# Patient Record
Sex: Female | Born: 1965 | Race: White | Hispanic: No | Smoking: Never smoker
Health system: Southern US, Community
[De-identification: ages and names within clinical notes are randomized; demographics above are authoritative.]

## PROBLEM LIST (undated history)

## (undated) DIAGNOSIS — M5430 Sciatica, unspecified side: Secondary | ICD-10-CM

## (undated) DIAGNOSIS — F419 Anxiety disorder, unspecified: Secondary | ICD-10-CM

## (undated) DIAGNOSIS — M1990 Unspecified osteoarthritis, unspecified site: Secondary | ICD-10-CM

## (undated) DIAGNOSIS — D352 Benign neoplasm of pituitary gland: Secondary | ICD-10-CM

## (undated) DIAGNOSIS — R0602 Shortness of breath: Secondary | ICD-10-CM

## (undated) DIAGNOSIS — H409 Unspecified glaucoma: Secondary | ICD-10-CM

## (undated) DIAGNOSIS — M549 Dorsalgia, unspecified: Secondary | ICD-10-CM

## (undated) DIAGNOSIS — M2550 Pain in unspecified joint: Secondary | ICD-10-CM

## (undated) DIAGNOSIS — Z98811 Dental restoration status: Secondary | ICD-10-CM

## (undated) DIAGNOSIS — E559 Vitamin D deficiency, unspecified: Secondary | ICD-10-CM

## (undated) DIAGNOSIS — M069 Rheumatoid arthritis, unspecified: Secondary | ICD-10-CM

## (undated) DIAGNOSIS — Z87442 Personal history of urinary calculi: Secondary | ICD-10-CM

## (undated) DIAGNOSIS — L729 Follicular cyst of the skin and subcutaneous tissue, unspecified: Secondary | ICD-10-CM

## (undated) DIAGNOSIS — K219 Gastro-esophageal reflux disease without esophagitis: Secondary | ICD-10-CM

## (undated) DIAGNOSIS — E785 Hyperlipidemia, unspecified: Secondary | ICD-10-CM

## (undated) DIAGNOSIS — G5601 Carpal tunnel syndrome, right upper limb: Secondary | ICD-10-CM

## (undated) DIAGNOSIS — R1310 Dysphagia, unspecified: Secondary | ICD-10-CM

## (undated) DIAGNOSIS — M2560 Stiffness of unspecified joint, not elsewhere classified: Secondary | ICD-10-CM

## (undated) DIAGNOSIS — G4730 Sleep apnea, unspecified: Secondary | ICD-10-CM

## (undated) DIAGNOSIS — F32A Depression, unspecified: Secondary | ICD-10-CM

## (undated) HISTORY — DX: Depression, unspecified: F32.A

## (undated) HISTORY — DX: Benign neoplasm of pituitary gland: D35.2

## (undated) HISTORY — DX: Dysphagia, unspecified: R13.10

## (undated) HISTORY — PX: CATARACT EXTRACTION: SUR2

## (undated) HISTORY — PX: TOTAL KNEE ARTHROPLASTY: SHX125

## (undated) HISTORY — PX: TOTAL HIP ARTHROPLASTY: SHX124

## (undated) HISTORY — DX: Anxiety disorder, unspecified: F41.9

## (undated) HISTORY — DX: Sciatica, unspecified side: M54.30

## (undated) HISTORY — PX: CARPAL TUNNEL RELEASE: SHX101

## (undated) HISTORY — PX: REVISION TOTAL KNEE ARTHROPLASTY: SUR1280

## (undated) HISTORY — DX: Carpal tunnel syndrome, right upper limb: G56.01

## (undated) HISTORY — DX: Sleep apnea, unspecified: G47.30

## (undated) HISTORY — DX: Vitamin D deficiency, unspecified: E55.9

## (undated) HISTORY — DX: Rheumatoid arthritis, unspecified: M06.9

## (undated) HISTORY — DX: Shortness of breath: R06.02

## (undated) HISTORY — DX: Hyperlipidemia, unspecified: E78.5

## (undated) HISTORY — DX: Unspecified osteoarthritis, unspecified site: M19.90

## (undated) HISTORY — DX: Pain in unspecified joint: M25.50

## (undated) HISTORY — DX: Dorsalgia, unspecified: M54.9

---

## 2003-08-24 ENCOUNTER — Inpatient Hospital Stay (HOSPITAL_COMMUNITY): Admission: AD | Admit: 2003-08-24 | Discharge: 2003-08-24 | Payer: Self-pay | Admitting: Obstetrics and Gynecology

## 2003-08-24 ENCOUNTER — Inpatient Hospital Stay (HOSPITAL_COMMUNITY): Admission: AD | Admit: 2003-08-24 | Discharge: 2003-08-26 | Payer: Self-pay | Admitting: Obstetrics and Gynecology

## 2003-08-26 ENCOUNTER — Inpatient Hospital Stay (HOSPITAL_COMMUNITY): Admission: AD | Admit: 2003-08-26 | Discharge: 2003-08-29 | Payer: Self-pay | Admitting: Obstetrics and Gynecology

## 2003-08-28 ENCOUNTER — Encounter: Payer: Self-pay | Admitting: Urology

## 2003-08-28 HISTORY — PX: CYSTOSCOPY WITH RETROGRADE PYELOGRAM, URETEROSCOPY AND STENT PLACEMENT: SHX5789

## 2004-01-01 ENCOUNTER — Inpatient Hospital Stay (HOSPITAL_COMMUNITY): Admission: AD | Admit: 2004-01-01 | Discharge: 2004-01-04 | Payer: Self-pay | Admitting: Obstetrics and Gynecology

## 2004-04-28 ENCOUNTER — Ambulatory Visit (HOSPITAL_COMMUNITY): Admission: RE | Admit: 2004-04-28 | Discharge: 2004-04-28 | Payer: Self-pay | Admitting: Gastroenterology

## 2004-04-28 HISTORY — PX: ESOPHAGEAL MANOMETRY: SHX1526

## 2004-08-06 ENCOUNTER — Ambulatory Visit: Payer: Self-pay | Admitting: Psychology

## 2004-08-20 ENCOUNTER — Ambulatory Visit: Payer: Self-pay | Admitting: Psychology

## 2004-08-31 ENCOUNTER — Ambulatory Visit: Payer: Self-pay | Admitting: Psychology

## 2004-09-10 ENCOUNTER — Ambulatory Visit: Payer: Self-pay | Admitting: Psychology

## 2004-09-15 ENCOUNTER — Ambulatory Visit: Payer: Self-pay | Admitting: Psychology

## 2004-09-21 ENCOUNTER — Ambulatory Visit: Payer: Self-pay | Admitting: Psychology

## 2004-09-30 ENCOUNTER — Ambulatory Visit: Payer: Self-pay | Admitting: Psychology

## 2004-10-07 ENCOUNTER — Ambulatory Visit: Payer: Self-pay | Admitting: Psychology

## 2004-10-14 ENCOUNTER — Ambulatory Visit: Payer: Self-pay | Admitting: Psychology

## 2004-10-28 ENCOUNTER — Ambulatory Visit: Payer: Self-pay | Admitting: Psychology

## 2004-11-03 ENCOUNTER — Ambulatory Visit: Payer: Self-pay | Admitting: Psychology

## 2004-11-05 ENCOUNTER — Other Ambulatory Visit: Admission: RE | Admit: 2004-11-05 | Discharge: 2004-11-05 | Payer: Self-pay | Admitting: Obstetrics and Gynecology

## 2004-11-12 ENCOUNTER — Ambulatory Visit: Payer: Self-pay | Admitting: Psychology

## 2004-11-16 ENCOUNTER — Ambulatory Visit: Payer: Self-pay | Admitting: Psychology

## 2004-12-09 ENCOUNTER — Ambulatory Visit: Payer: Self-pay | Admitting: Psychology

## 2004-12-14 ENCOUNTER — Ambulatory Visit: Payer: Self-pay | Admitting: Psychology

## 2004-12-23 ENCOUNTER — Ambulatory Visit: Payer: Self-pay | Admitting: Psychology

## 2004-12-30 ENCOUNTER — Ambulatory Visit: Payer: Self-pay | Admitting: Psychology

## 2005-01-05 ENCOUNTER — Ambulatory Visit: Payer: Self-pay | Admitting: Psychology

## 2005-01-07 ENCOUNTER — Encounter: Admission: RE | Admit: 2005-01-07 | Discharge: 2005-01-07 | Payer: Self-pay | Admitting: Rheumatology

## 2005-01-19 ENCOUNTER — Ambulatory Visit: Payer: Self-pay | Admitting: Psychology

## 2005-01-27 ENCOUNTER — Ambulatory Visit: Payer: Self-pay | Admitting: Psychology

## 2005-02-16 ENCOUNTER — Ambulatory Visit: Payer: Self-pay | Admitting: Psychology

## 2005-03-02 ENCOUNTER — Ambulatory Visit: Payer: Self-pay | Admitting: Psychology

## 2005-03-05 ENCOUNTER — Ambulatory Visit: Payer: Self-pay | Admitting: Licensed Clinical Social Worker

## 2005-03-12 ENCOUNTER — Ambulatory Visit: Payer: Self-pay | Admitting: Licensed Clinical Social Worker

## 2005-03-16 ENCOUNTER — Ambulatory Visit: Payer: Self-pay | Admitting: Licensed Clinical Social Worker

## 2005-03-26 ENCOUNTER — Ambulatory Visit: Payer: Self-pay | Admitting: Licensed Clinical Social Worker

## 2005-03-30 ENCOUNTER — Ambulatory Visit: Payer: Self-pay | Admitting: Internal Medicine

## 2005-04-01 ENCOUNTER — Ambulatory Visit: Payer: Self-pay | Admitting: Licensed Clinical Social Worker

## 2005-04-30 ENCOUNTER — Ambulatory Visit: Payer: Self-pay | Admitting: Licensed Clinical Social Worker

## 2005-05-04 ENCOUNTER — Encounter: Admission: RE | Admit: 2005-05-04 | Discharge: 2005-05-04 | Payer: Self-pay | Admitting: Rheumatology

## 2005-05-11 ENCOUNTER — Ambulatory Visit: Payer: Self-pay | Admitting: Licensed Clinical Social Worker

## 2005-05-19 ENCOUNTER — Ambulatory Visit: Payer: Self-pay | Admitting: Licensed Clinical Social Worker

## 2005-05-28 ENCOUNTER — Ambulatory Visit: Payer: Self-pay | Admitting: Licensed Clinical Social Worker

## 2005-06-02 ENCOUNTER — Ambulatory Visit: Payer: Self-pay | Admitting: Licensed Clinical Social Worker

## 2005-06-10 ENCOUNTER — Ambulatory Visit: Payer: Self-pay | Admitting: Licensed Clinical Social Worker

## 2005-06-18 ENCOUNTER — Ambulatory Visit: Payer: Self-pay | Admitting: Licensed Clinical Social Worker

## 2005-06-25 ENCOUNTER — Ambulatory Visit: Payer: Self-pay | Admitting: Licensed Clinical Social Worker

## 2005-06-29 ENCOUNTER — Ambulatory Visit: Payer: Self-pay | Admitting: Licensed Clinical Social Worker

## 2005-07-09 ENCOUNTER — Ambulatory Visit: Payer: Self-pay | Admitting: Licensed Clinical Social Worker

## 2005-07-09 ENCOUNTER — Ambulatory Visit: Payer: Self-pay | Admitting: Pulmonary Disease

## 2005-07-13 ENCOUNTER — Ambulatory Visit: Payer: Self-pay | Admitting: Pulmonary Disease

## 2005-07-15 ENCOUNTER — Ambulatory Visit: Payer: Self-pay | Admitting: Internal Medicine

## 2005-07-23 ENCOUNTER — Ambulatory Visit: Payer: Self-pay | Admitting: Licensed Clinical Social Worker

## 2005-08-06 ENCOUNTER — Ambulatory Visit: Payer: Self-pay | Admitting: Licensed Clinical Social Worker

## 2005-08-24 ENCOUNTER — Ambulatory Visit: Payer: Self-pay | Admitting: Licensed Clinical Social Worker

## 2005-09-07 ENCOUNTER — Ambulatory Visit: Payer: Self-pay | Admitting: Licensed Clinical Social Worker

## 2005-09-10 ENCOUNTER — Ambulatory Visit: Payer: Self-pay | Admitting: Licensed Clinical Social Worker

## 2005-10-05 ENCOUNTER — Ambulatory Visit: Payer: Self-pay | Admitting: Licensed Clinical Social Worker

## 2005-10-12 ENCOUNTER — Ambulatory Visit: Payer: Self-pay | Admitting: Licensed Clinical Social Worker

## 2005-10-15 ENCOUNTER — Ambulatory Visit: Payer: Self-pay | Admitting: Internal Medicine

## 2005-10-20 ENCOUNTER — Ambulatory Visit: Payer: Self-pay | Admitting: Licensed Clinical Social Worker

## 2005-10-26 ENCOUNTER — Ambulatory Visit: Payer: Self-pay | Admitting: Licensed Clinical Social Worker

## 2005-11-05 ENCOUNTER — Ambulatory Visit: Payer: Self-pay | Admitting: Licensed Clinical Social Worker

## 2005-11-09 ENCOUNTER — Other Ambulatory Visit: Admission: RE | Admit: 2005-11-09 | Discharge: 2005-11-09 | Payer: Self-pay | Admitting: Obstetrics and Gynecology

## 2005-11-12 ENCOUNTER — Ambulatory Visit: Payer: Self-pay | Admitting: Licensed Clinical Social Worker

## 2005-11-15 ENCOUNTER — Ambulatory Visit: Payer: Self-pay | Admitting: Licensed Clinical Social Worker

## 2005-11-19 ENCOUNTER — Ambulatory Visit: Payer: Self-pay | Admitting: Licensed Clinical Social Worker

## 2005-11-26 ENCOUNTER — Ambulatory Visit: Payer: Self-pay | Admitting: Licensed Clinical Social Worker

## 2005-12-08 ENCOUNTER — Ambulatory Visit: Payer: Self-pay | Admitting: Licensed Clinical Social Worker

## 2005-12-10 ENCOUNTER — Ambulatory Visit: Payer: Self-pay | Admitting: Licensed Clinical Social Worker

## 2005-12-17 ENCOUNTER — Ambulatory Visit: Payer: Self-pay | Admitting: Licensed Clinical Social Worker

## 2005-12-20 ENCOUNTER — Ambulatory Visit: Payer: Self-pay | Admitting: Internal Medicine

## 2005-12-23 ENCOUNTER — Ambulatory Visit: Payer: Self-pay | Admitting: Licensed Clinical Social Worker

## 2006-01-12 ENCOUNTER — Ambulatory Visit: Payer: Self-pay | Admitting: Licensed Clinical Social Worker

## 2006-01-18 ENCOUNTER — Ambulatory Visit: Payer: Self-pay | Admitting: Internal Medicine

## 2006-01-21 ENCOUNTER — Ambulatory Visit: Payer: Self-pay | Admitting: Licensed Clinical Social Worker

## 2006-01-28 ENCOUNTER — Ambulatory Visit: Payer: Self-pay | Admitting: Licensed Clinical Social Worker

## 2006-01-28 ENCOUNTER — Ambulatory Visit: Payer: Self-pay | Admitting: Internal Medicine

## 2006-02-04 ENCOUNTER — Ambulatory Visit: Payer: Self-pay | Admitting: Licensed Clinical Social Worker

## 2006-02-08 ENCOUNTER — Ambulatory Visit: Payer: Self-pay | Admitting: Licensed Clinical Social Worker

## 2006-03-04 ENCOUNTER — Ambulatory Visit: Payer: Self-pay | Admitting: Licensed Clinical Social Worker

## 2006-03-07 ENCOUNTER — Ambulatory Visit: Payer: Self-pay | Admitting: Pulmonary Disease

## 2006-03-08 ENCOUNTER — Emergency Department (HOSPITAL_COMMUNITY): Admission: EM | Admit: 2006-03-08 | Discharge: 2006-03-08 | Payer: Self-pay | Admitting: Emergency Medicine

## 2006-03-18 ENCOUNTER — Ambulatory Visit: Payer: Self-pay | Admitting: Licensed Clinical Social Worker

## 2006-03-25 ENCOUNTER — Ambulatory Visit: Payer: Self-pay | Admitting: Internal Medicine

## 2006-03-25 ENCOUNTER — Ambulatory Visit: Payer: Self-pay | Admitting: Licensed Clinical Social Worker

## 2006-03-30 ENCOUNTER — Ambulatory Visit: Payer: Self-pay | Admitting: Licensed Clinical Social Worker

## 2006-03-31 ENCOUNTER — Ambulatory Visit: Payer: Self-pay | Admitting: Internal Medicine

## 2006-04-01 ENCOUNTER — Ambulatory Visit: Payer: Self-pay | Admitting: Internal Medicine

## 2006-04-01 ENCOUNTER — Ambulatory Visit: Payer: Self-pay | Admitting: Licensed Clinical Social Worker

## 2006-04-04 ENCOUNTER — Ambulatory Visit: Payer: Self-pay | Admitting: Licensed Clinical Social Worker

## 2006-04-22 ENCOUNTER — Ambulatory Visit: Payer: Self-pay | Admitting: Internal Medicine

## 2006-04-22 ENCOUNTER — Ambulatory Visit: Payer: Self-pay | Admitting: Licensed Clinical Social Worker

## 2006-05-09 ENCOUNTER — Ambulatory Visit: Payer: Self-pay | Admitting: Licensed Clinical Social Worker

## 2006-06-03 ENCOUNTER — Ambulatory Visit: Payer: Self-pay | Admitting: Internal Medicine

## 2006-06-03 ENCOUNTER — Ambulatory Visit: Payer: Self-pay | Admitting: Licensed Clinical Social Worker

## 2006-06-10 ENCOUNTER — Ambulatory Visit: Payer: Self-pay | Admitting: Licensed Clinical Social Worker

## 2006-06-14 ENCOUNTER — Ambulatory Visit: Payer: Self-pay | Admitting: Licensed Clinical Social Worker

## 2006-06-21 ENCOUNTER — Ambulatory Visit: Payer: Self-pay | Admitting: Licensed Clinical Social Worker

## 2006-06-22 ENCOUNTER — Ambulatory Visit: Payer: Self-pay | Admitting: Licensed Clinical Social Worker

## 2006-06-30 ENCOUNTER — Ambulatory Visit: Payer: Self-pay | Admitting: Licensed Clinical Social Worker

## 2006-07-04 ENCOUNTER — Ambulatory Visit: Payer: Self-pay | Admitting: Licensed Clinical Social Worker

## 2006-07-11 ENCOUNTER — Ambulatory Visit: Payer: Self-pay | Admitting: Licensed Clinical Social Worker

## 2006-07-12 ENCOUNTER — Ambulatory Visit: Payer: Self-pay | Admitting: Licensed Clinical Social Worker

## 2006-07-18 ENCOUNTER — Ambulatory Visit: Payer: Self-pay | Admitting: Licensed Clinical Social Worker

## 2006-07-30 ENCOUNTER — Ambulatory Visit (HOSPITAL_BASED_OUTPATIENT_CLINIC_OR_DEPARTMENT_OTHER): Admission: RE | Admit: 2006-07-30 | Discharge: 2006-07-30 | Payer: Self-pay | Admitting: Otolaryngology

## 2006-07-31 ENCOUNTER — Ambulatory Visit: Payer: Self-pay | Admitting: Internal Medicine

## 2006-08-01 ENCOUNTER — Ambulatory Visit: Payer: Self-pay | Admitting: Sports Medicine

## 2006-08-15 ENCOUNTER — Ambulatory Visit: Payer: Self-pay | Admitting: Licensed Clinical Social Worker

## 2006-08-22 ENCOUNTER — Ambulatory Visit: Payer: Self-pay | Admitting: Licensed Clinical Social Worker

## 2006-08-29 ENCOUNTER — Ambulatory Visit: Payer: Self-pay | Admitting: Licensed Clinical Social Worker

## 2006-09-05 ENCOUNTER — Ambulatory Visit: Payer: Self-pay | Admitting: Licensed Clinical Social Worker

## 2006-10-14 ENCOUNTER — Ambulatory Visit: Payer: Self-pay | Admitting: Licensed Clinical Social Worker

## 2006-10-17 ENCOUNTER — Ambulatory Visit: Payer: Self-pay | Admitting: Licensed Clinical Social Worker

## 2006-10-24 ENCOUNTER — Ambulatory Visit: Payer: Self-pay | Admitting: Licensed Clinical Social Worker

## 2006-10-31 ENCOUNTER — Ambulatory Visit: Payer: Self-pay | Admitting: Licensed Clinical Social Worker

## 2006-11-07 ENCOUNTER — Ambulatory Visit: Payer: Self-pay | Admitting: Licensed Clinical Social Worker

## 2006-11-08 ENCOUNTER — Ambulatory Visit: Payer: Self-pay | Admitting: Licensed Clinical Social Worker

## 2006-11-21 ENCOUNTER — Ambulatory Visit: Payer: Self-pay | Admitting: Licensed Clinical Social Worker

## 2006-11-28 ENCOUNTER — Ambulatory Visit: Payer: Self-pay | Admitting: Licensed Clinical Social Worker

## 2006-12-05 ENCOUNTER — Ambulatory Visit: Payer: Self-pay | Admitting: Licensed Clinical Social Worker

## 2006-12-19 ENCOUNTER — Ambulatory Visit: Payer: Self-pay | Admitting: Licensed Clinical Social Worker

## 2006-12-20 ENCOUNTER — Ambulatory Visit: Payer: Self-pay | Admitting: Licensed Clinical Social Worker

## 2006-12-30 ENCOUNTER — Ambulatory Visit: Payer: Self-pay | Admitting: Licensed Clinical Social Worker

## 2007-01-02 ENCOUNTER — Ambulatory Visit: Payer: Self-pay | Admitting: Licensed Clinical Social Worker

## 2007-01-09 ENCOUNTER — Ambulatory Visit: Payer: Self-pay | Admitting: Licensed Clinical Social Worker

## 2007-01-11 ENCOUNTER — Ambulatory Visit: Payer: Self-pay | Admitting: Licensed Clinical Social Worker

## 2007-01-19 ENCOUNTER — Ambulatory Visit: Payer: Self-pay | Admitting: Licensed Clinical Social Worker

## 2007-01-31 ENCOUNTER — Ambulatory Visit: Payer: Self-pay | Admitting: Licensed Clinical Social Worker

## 2007-02-07 ENCOUNTER — Ambulatory Visit: Payer: Self-pay | Admitting: Licensed Clinical Social Worker

## 2007-02-13 ENCOUNTER — Ambulatory Visit: Payer: Self-pay | Admitting: Licensed Clinical Social Worker

## 2007-02-20 ENCOUNTER — Ambulatory Visit: Payer: Self-pay | Admitting: Licensed Clinical Social Worker

## 2007-03-06 ENCOUNTER — Ambulatory Visit: Payer: Self-pay | Admitting: Licensed Clinical Social Worker

## 2007-03-13 ENCOUNTER — Ambulatory Visit: Payer: Self-pay | Admitting: Licensed Clinical Social Worker

## 2007-03-24 ENCOUNTER — Ambulatory Visit: Payer: Self-pay | Admitting: Licensed Clinical Social Worker

## 2007-04-04 ENCOUNTER — Ambulatory Visit: Payer: Self-pay | Admitting: Licensed Clinical Social Worker

## 2007-04-11 ENCOUNTER — Ambulatory Visit: Payer: Self-pay | Admitting: Licensed Clinical Social Worker

## 2007-05-05 ENCOUNTER — Ambulatory Visit: Payer: Self-pay | Admitting: Licensed Clinical Social Worker

## 2007-05-19 ENCOUNTER — Ambulatory Visit: Payer: Self-pay | Admitting: Licensed Clinical Social Worker

## 2007-06-02 ENCOUNTER — Ambulatory Visit: Payer: Self-pay | Admitting: Licensed Clinical Social Worker

## 2007-06-20 ENCOUNTER — Ambulatory Visit: Payer: Self-pay | Admitting: Internal Medicine

## 2007-06-27 ENCOUNTER — Ambulatory Visit: Payer: Self-pay | Admitting: Licensed Clinical Social Worker

## 2007-09-23 DIAGNOSIS — J310 Chronic rhinitis: Secondary | ICD-10-CM | POA: Insufficient documentation

## 2007-09-23 DIAGNOSIS — J4550 Severe persistent asthma, uncomplicated: Secondary | ICD-10-CM | POA: Insufficient documentation

## 2007-09-23 DIAGNOSIS — M069 Rheumatoid arthritis, unspecified: Secondary | ICD-10-CM | POA: Insufficient documentation

## 2007-10-27 ENCOUNTER — Ambulatory Visit: Payer: Self-pay | Admitting: Internal Medicine

## 2007-11-09 ENCOUNTER — Telehealth (INDEPENDENT_AMBULATORY_CARE_PROVIDER_SITE_OTHER): Payer: Self-pay | Admitting: *Deleted

## 2007-11-09 ENCOUNTER — Ambulatory Visit: Payer: Self-pay | Admitting: Internal Medicine

## 2007-11-10 ENCOUNTER — Telehealth: Payer: Self-pay | Admitting: Internal Medicine

## 2007-11-15 ENCOUNTER — Telehealth (INDEPENDENT_AMBULATORY_CARE_PROVIDER_SITE_OTHER): Payer: Self-pay | Admitting: *Deleted

## 2007-11-16 DIAGNOSIS — R059 Cough, unspecified: Secondary | ICD-10-CM | POA: Insufficient documentation

## 2007-11-16 DIAGNOSIS — R05 Cough: Secondary | ICD-10-CM

## 2007-11-17 ENCOUNTER — Ambulatory Visit: Payer: Self-pay | Admitting: Internal Medicine

## 2007-12-29 ENCOUNTER — Ambulatory Visit: Payer: Self-pay | Admitting: Internal Medicine

## 2008-01-30 ENCOUNTER — Telehealth (INDEPENDENT_AMBULATORY_CARE_PROVIDER_SITE_OTHER): Payer: Self-pay | Admitting: *Deleted

## 2008-04-01 ENCOUNTER — Ambulatory Visit: Payer: Self-pay | Admitting: Licensed Clinical Social Worker

## 2008-04-16 ENCOUNTER — Ambulatory Visit: Payer: Self-pay | Admitting: Licensed Clinical Social Worker

## 2008-04-17 ENCOUNTER — Ambulatory Visit (HOSPITAL_COMMUNITY): Admission: EM | Admit: 2008-04-17 | Discharge: 2008-04-17 | Payer: Self-pay | Admitting: Emergency Medicine

## 2008-04-17 HISTORY — PX: HIP CLOSED REDUCTION: SHX983

## 2008-04-24 ENCOUNTER — Ambulatory Visit: Payer: Self-pay | Admitting: Licensed Clinical Social Worker

## 2008-04-30 ENCOUNTER — Ambulatory Visit: Payer: Self-pay | Admitting: Licensed Clinical Social Worker

## 2008-05-04 ENCOUNTER — Encounter: Admission: RE | Admit: 2008-05-04 | Discharge: 2008-05-04 | Payer: Self-pay | Admitting: Anesthesiology

## 2008-05-09 ENCOUNTER — Ambulatory Visit: Payer: Self-pay | Admitting: Licensed Clinical Social Worker

## 2008-05-16 ENCOUNTER — Ambulatory Visit: Payer: Self-pay | Admitting: Licensed Clinical Social Worker

## 2008-05-22 ENCOUNTER — Ambulatory Visit: Payer: Self-pay | Admitting: Licensed Clinical Social Worker

## 2008-06-06 ENCOUNTER — Ambulatory Visit: Payer: Self-pay | Admitting: Licensed Clinical Social Worker

## 2008-06-18 ENCOUNTER — Ambulatory Visit: Payer: Self-pay | Admitting: Licensed Clinical Social Worker

## 2008-06-20 ENCOUNTER — Ambulatory Visit: Payer: Self-pay | Admitting: Internal Medicine

## 2008-06-20 ENCOUNTER — Ambulatory Visit: Payer: Self-pay | Admitting: Licensed Clinical Social Worker

## 2008-06-24 ENCOUNTER — Ambulatory Visit: Payer: Self-pay | Admitting: Licensed Clinical Social Worker

## 2008-06-28 ENCOUNTER — Ambulatory Visit: Payer: Self-pay | Admitting: Licensed Clinical Social Worker

## 2008-07-02 ENCOUNTER — Ambulatory Visit: Payer: Self-pay | Admitting: Licensed Clinical Social Worker

## 2008-07-04 ENCOUNTER — Ambulatory Visit: Payer: Self-pay | Admitting: Oncology

## 2008-07-10 ENCOUNTER — Ambulatory Visit: Payer: Self-pay | Admitting: Licensed Clinical Social Worker

## 2008-07-12 ENCOUNTER — Ambulatory Visit: Payer: Self-pay | Admitting: Licensed Clinical Social Worker

## 2008-07-16 ENCOUNTER — Ambulatory Visit: Payer: Self-pay | Admitting: Licensed Clinical Social Worker

## 2008-07-23 ENCOUNTER — Telehealth (INDEPENDENT_AMBULATORY_CARE_PROVIDER_SITE_OTHER): Payer: Self-pay | Admitting: *Deleted

## 2008-07-23 ENCOUNTER — Ambulatory Visit: Payer: Self-pay | Admitting: Licensed Clinical Social Worker

## 2008-07-26 ENCOUNTER — Ambulatory Visit: Payer: Self-pay | Admitting: Licensed Clinical Social Worker

## 2008-07-26 ENCOUNTER — Ambulatory Visit: Payer: Self-pay | Admitting: Oncology

## 2008-08-01 ENCOUNTER — Ambulatory Visit: Payer: Self-pay | Admitting: Internal Medicine

## 2008-08-04 ENCOUNTER — Ambulatory Visit: Payer: Self-pay | Admitting: Oncology

## 2008-08-07 ENCOUNTER — Ambulatory Visit: Payer: Self-pay | Admitting: Licensed Clinical Social Worker

## 2008-08-15 ENCOUNTER — Encounter: Payer: Self-pay | Admitting: Internal Medicine

## 2008-08-16 ENCOUNTER — Ambulatory Visit: Payer: Self-pay | Admitting: Licensed Clinical Social Worker

## 2008-08-20 ENCOUNTER — Ambulatory Visit: Payer: Self-pay | Admitting: Licensed Clinical Social Worker

## 2008-08-22 ENCOUNTER — Ambulatory Visit: Payer: Self-pay | Admitting: Oncology

## 2008-08-23 ENCOUNTER — Ambulatory Visit: Payer: Self-pay | Admitting: Licensed Clinical Social Worker

## 2008-08-27 ENCOUNTER — Ambulatory Visit: Payer: Self-pay | Admitting: Licensed Clinical Social Worker

## 2008-09-03 ENCOUNTER — Ambulatory Visit: Payer: Self-pay | Admitting: Oncology

## 2008-09-03 ENCOUNTER — Ambulatory Visit: Payer: Self-pay | Admitting: Licensed Clinical Social Worker

## 2008-09-10 ENCOUNTER — Ambulatory Visit: Payer: Self-pay | Admitting: Licensed Clinical Social Worker

## 2008-09-17 ENCOUNTER — Ambulatory Visit: Payer: Self-pay | Admitting: Licensed Clinical Social Worker

## 2008-09-19 ENCOUNTER — Ambulatory Visit: Payer: Self-pay | Admitting: Licensed Clinical Social Worker

## 2008-09-20 ENCOUNTER — Ambulatory Visit: Payer: Self-pay | Admitting: Licensed Clinical Social Worker

## 2008-09-25 ENCOUNTER — Ambulatory Visit: Payer: Self-pay | Admitting: Licensed Clinical Social Worker

## 2008-10-04 ENCOUNTER — Ambulatory Visit: Payer: Self-pay | Admitting: Oncology

## 2008-10-07 ENCOUNTER — Ambulatory Visit: Payer: Self-pay | Admitting: Licensed Clinical Social Worker

## 2008-10-14 ENCOUNTER — Ambulatory Visit: Payer: Self-pay | Admitting: Licensed Clinical Social Worker

## 2008-10-18 ENCOUNTER — Ambulatory Visit: Payer: Self-pay | Admitting: Licensed Clinical Social Worker

## 2008-10-23 ENCOUNTER — Ambulatory Visit: Payer: Self-pay | Admitting: Licensed Clinical Social Worker

## 2008-10-29 ENCOUNTER — Ambulatory Visit: Payer: Self-pay | Admitting: Licensed Clinical Social Worker

## 2008-11-04 ENCOUNTER — Ambulatory Visit: Payer: Self-pay | Admitting: Oncology

## 2008-11-11 ENCOUNTER — Telehealth (INDEPENDENT_AMBULATORY_CARE_PROVIDER_SITE_OTHER): Payer: Self-pay | Admitting: *Deleted

## 2008-11-12 ENCOUNTER — Telehealth (INDEPENDENT_AMBULATORY_CARE_PROVIDER_SITE_OTHER): Payer: Self-pay | Admitting: *Deleted

## 2008-11-13 ENCOUNTER — Ambulatory Visit: Payer: Self-pay | Admitting: Internal Medicine

## 2008-11-14 ENCOUNTER — Ambulatory Visit: Payer: Self-pay | Admitting: Licensed Clinical Social Worker

## 2008-12-02 ENCOUNTER — Ambulatory Visit: Payer: Self-pay | Admitting: Oncology

## 2009-01-02 ENCOUNTER — Ambulatory Visit: Payer: Self-pay | Admitting: Oncology

## 2009-02-01 ENCOUNTER — Ambulatory Visit: Payer: Self-pay | Admitting: Oncology

## 2009-03-04 ENCOUNTER — Ambulatory Visit: Payer: Self-pay | Admitting: Internal Medicine

## 2009-03-04 ENCOUNTER — Ambulatory Visit: Payer: Self-pay | Admitting: Oncology

## 2009-04-03 ENCOUNTER — Ambulatory Visit: Payer: Self-pay | Admitting: Internal Medicine

## 2009-04-03 ENCOUNTER — Ambulatory Visit: Payer: Self-pay | Admitting: Oncology

## 2009-05-04 ENCOUNTER — Ambulatory Visit: Payer: Self-pay | Admitting: Oncology

## 2009-06-04 ENCOUNTER — Ambulatory Visit: Payer: Self-pay | Admitting: Oncology

## 2009-07-04 ENCOUNTER — Ambulatory Visit: Payer: Self-pay | Admitting: Oncology

## 2009-08-04 ENCOUNTER — Ambulatory Visit: Payer: Self-pay | Admitting: Internal Medicine

## 2009-08-19 ENCOUNTER — Ambulatory Visit: Payer: Self-pay | Admitting: Internal Medicine

## 2009-09-03 ENCOUNTER — Ambulatory Visit: Payer: Self-pay | Admitting: Internal Medicine

## 2009-09-29 ENCOUNTER — Ambulatory Visit: Payer: Self-pay | Admitting: Internal Medicine

## 2009-10-04 ENCOUNTER — Ambulatory Visit: Payer: Self-pay | Admitting: Internal Medicine

## 2009-11-10 ENCOUNTER — Ambulatory Visit: Payer: Self-pay | Admitting: Oncology

## 2009-12-02 ENCOUNTER — Ambulatory Visit: Payer: Self-pay | Admitting: Oncology

## 2009-12-17 ENCOUNTER — Telehealth: Payer: Self-pay | Admitting: Internal Medicine

## 2010-01-02 ENCOUNTER — Ambulatory Visit: Payer: Self-pay | Admitting: Oncology

## 2010-02-01 ENCOUNTER — Ambulatory Visit: Payer: Self-pay | Admitting: Oncology

## 2010-03-10 ENCOUNTER — Ambulatory Visit: Payer: Self-pay | Admitting: Oncology

## 2010-04-03 ENCOUNTER — Ambulatory Visit: Payer: Self-pay | Admitting: Oncology

## 2010-05-04 ENCOUNTER — Ambulatory Visit: Payer: Self-pay | Admitting: Internal Medicine

## 2010-05-04 ENCOUNTER — Ambulatory Visit: Payer: Self-pay | Admitting: Oncology

## 2010-06-04 ENCOUNTER — Ambulatory Visit: Payer: Self-pay | Admitting: Oncology

## 2010-07-04 ENCOUNTER — Ambulatory Visit: Payer: Self-pay | Admitting: Oncology

## 2010-08-04 ENCOUNTER — Ambulatory Visit: Payer: Self-pay | Admitting: Oncology

## 2010-09-07 ENCOUNTER — Ambulatory Visit: Payer: Self-pay | Admitting: Oncology

## 2010-10-04 ENCOUNTER — Ambulatory Visit: Payer: Self-pay | Admitting: Oncology

## 2010-10-26 ENCOUNTER — Encounter: Payer: Self-pay | Admitting: Anesthesiology

## 2010-11-03 NOTE — Progress Notes (Signed)
Summary: prescripts  Phone Note Call from Patient Call back at [phone]   Caller: Patient Call For: Arish Redner Summary of Call: pt would like prescript for maxair auto haler sent to [facility] Initial call taken by: Rickard Patience,  December 17, 2009 10:06 AM  Follow-up for Phone Call        called [facility] to verify how many inhalers would be needd for 90 day supply. they stated one Shearon Stalls is for 30 days. so rx sent to [facility] for 90 days. pt aware. Carron Curie CMA  December 17, 2009 10:37 AM     Prescriptions: MAXAIR AUTOHALER 200 MCG/INH  AERB (PIRBUTEROL ACETATE) inhale 1  to 2 puffs every 4 to 6 hours as needed  #3 x 2   Entered by:   Carron Curie CMA   Authorized by:   Nyoka Cowden MD   Signed by:   Carron Curie CMA on 12/17/2009   Method used:   Faxed to ...       [facility]* (mail-order)             ,          Ph: [phone]       Fax: [phone]   RxID:   [id number]

## 2010-11-04 ENCOUNTER — Ambulatory Visit: Payer: Self-pay | Admitting: Oncology

## 2010-12-03 ENCOUNTER — Ambulatory Visit: Payer: Self-pay | Admitting: Oncology

## 2011-01-04 ENCOUNTER — Ambulatory Visit: Payer: Self-pay | Admitting: Internal Medicine

## 2011-02-02 ENCOUNTER — Ambulatory Visit: Payer: Self-pay | Admitting: Internal Medicine

## 2011-02-16 NOTE — Op Note (Signed)
[name], [name]                  ACCOUNT NO.:  [id number]   MEDICAL RECORD NO.:  [id number]          PATIENT TYPE:  OBV   LOCATION:  0098                         FACILITY:  [facility]   PHYSICIAN:  Vania Rea. Supple, M.D.  DATE OF BIRTH:  [birth year]   DATE OF PROCEDURE:  04/17/2008  DATE OF DISCHARGE:                               OPERATIVE REPORT   PREOPERATIVE DIAGNOSIS:  Dislocated right total hip arthroplasty.   POSTOPERATIVE DIAGNOSIS:  Dislocated right total hip arthroplasty.   PROCEDURE:  Closed reduction of dislocated right total hip arthroplasty.   SURGEON OF RECORD:  Vania Rea. Supple, M.D.   ANESTHESIA:  LMA general.   HISTORY:  Kayla [name] is a 45 year old female who has a past history of  juvenile rheumatoid arthritis and underwent her initial right total hip  arthroplasty at the age of 40.  She has subsequently had three  revisions, the most recent being in May 2009.  She was at home earlier  today when she crossed her legs and experienced immediate right hip  pain. foreshortening of the right lower extremity, and inability to bear  weight.  She was brought to the [facility] Emergency Room, where the  right lower extremity was found be foreshortened and externally rotated.  X-rays confirmed a dislocation of the right total hip.  She is brought  to the operating room at this time for plain closed reduction.   Preoperatively, I counseled Kayla [name] on treatment options, as well as  risks versus benefits thereof.  Possible surgical complications of  periarticular fracture and/or dislocation, recurrent dislocation, and  possible need for additional surgery were reviewed.  She understands and  accepts and agrees with our plan.   PROCEDURE IN DETAIL:  After undergoing routine preop evaluation, the  patient was brought to the operating room, and on her hospital bed  underwent smooth induction of an LMA general anesthesia.  She was  transferred to the radiolucent fracture table.   She received complete  muscle paralysis in which she was maximally paralyzed.  I then performed  a gentle reduction of the right hip with longitudinal traction and  gentle rotation, and a palpable and visible reduction movement occurred.  Subsequently the leg lengths were noted be clinically equal.  We then  obtained fluoroscopic images which confirmed congruent alignment of the  joint, and I did use live fluoro to move the hip through a range of  motion, and also performed a shuck test which did show a moderate  degree of distal translation of the humeral head, but overall the joint  was stable through a range of motion to 90 degrees of flexion and 60  degrees of internal and external rotation.  At this point, a knee  immobilizer was applied to the right lower extremity.  The patient was  then awakened, extubated, and taken to the recovery room in stable  condition.     Vania Rea. Supple, M.D.  Electronically Signed    KMS/MEDQ  D:  04/17/2008  T:  04/17/2008  Job:  [id number]

## 2011-02-16 NOTE — Assessment & Plan Note (Signed)
[facility]                                 ON-CALL NOTE   LEONI, GOODNESS                           MRN:          [id number]  DATE:08/15/2009                            DOB:          1965/10/15    Time approximately 2215 hours.   PRIMARY CARE PHYSICIAN:  Casimiro Needle B. Sherene Sires, MD, [facility]   Ms. Wooton called the on-call number tonight claiming that she had a mild  URI with a severe cough, refractory to over-the-counter therapy.  She  has been treated successfully in the past with hydrocodone/acetaminophen  for this cough and requested a refill.  Review of Dr. Thurston Hole record on  February 2010, confirmed her prior use of hydrocodone with  acetaminophen.  I called [facility] and ordered 12 tablets for her to  get her through the weekend.  She was instructed to go to the emergency  room if her breathing worsened at all.  Otherwise, she should follow up  with Dr. Sherene Sires when clinic opens on Monday or call back if symptoms  worsen in the interim.     Rogelia Rohrer, MD  Electronically Signed    Clemetine Marker  DD: 08/15/2009  DT: 08/16/2009  Job #: [id number]

## 2011-02-16 NOTE — Assessment & Plan Note (Signed)
[place] [facility]                             PULMONARY OFFICE NOTE   AJANEE, BUREN                         MRN:          [id number]  DATE:06/20/2007                            DOB:          12/17/1965    HISTORY OF PRESENT ILLNESS:  The patient is a 45 year old white female  patient of Dr. Thurston Hole who has a known history of severe rheumatoid  arthritis and chronic obstructive asthma who presents today for an acute  office visit.  The patient complains of a 10 day history of productive  cough with thick green sputum, nasal congestion, wheezing and increased  shortness of breath.  The patient denies any hemoptysis, orthopnea, PND  or leg swelling.   PAST MEDICAL HISTORY:  Her past medical history is reviewed.   CURRENT MEDICATIONS:  Reviewed.   PHYSICAL EXAMINATION:  GENERAL APPEARANCE:  The patient is a pleasant  female in no acute distress.  VITAL SIGNS:  Temperature 99.3.  Blood pressure 106/64. Oxygen  saturation 94% on room air.  HEENT:  Unremarkable.  NECK:  Supple without cervical adenopathy.  No jugular venous  distention.  LUNG:  Breath sounds are diminished at the bases, otherwise clear.  No  wheezing is noted.  CARDIAC:  Regular rate.  ABDOMEN:  Soft, nontender.  EXTREMITIES:  Warm without any calf tenderness, cyanosis, clubbing or  edema.  The patient's hands to show severe rheumatoid deformities  bilaterally.   IMPRESSION AND PLAN:  Acute tracheobronchitis with mild asthmatic flare.  The patient is to begin Omnicef x7 days.  Add in Mucinex DM twice a day.  The patient was given a Xopenex nebulizer treatment today in the office.  Will maintain steroids at her baseline at 10 mg with no increase in dose  at this time.  The patient may use Tramadol for break-through coughing.  The patient is to avoid throat clearing and use non-mint lozenges to  avoid coughing and clearing throat.  The patient has been recommended to  restart Nexium for  any underlying reflux that could be irritating the  airways.  The patient will return back here in one month with Dr. Sherene Sires  as scheduled or sooner if needed.     Rubye Oaks, NP  Electronically Signed      [name]. Sherene Sires, MD, [facility]  Electronically Signed   TP/MedQ  DD: 06/20/2007  DT: 06/21/2007  Job #: [id number]

## 2011-02-19 NOTE — Op Note (Signed)
NAME:  Kayla [name], Kayla [name]                            ACCOUNT NO.:  [id number]   MEDICAL RECORD NO.:  [id number]                   PATIENT TYPE:  INP   LOCATION:  9199                                 FACILITY:  [facility]   PHYSICIAN:  Randye Lobo, M.D.                DATE OF BIRTH:  [birth year]   DATE OF PROCEDURE:  01/01/2004  DATE OF DISCHARGE:                                 OPERATIVE REPORT   PREOPERATIVE DIAGNOSES:  1. Intrauterine gestation at 37+ weeks.  2. Early labor.  3. Rheumatoid arthritis and a history of multiple hip replacements.   POSTOPERATIVE DIAGNOSES:  1. Intrauterine gestation at 37+ weeks.  2. Early labor.  3. Rheumatoid arthritis with history of multiple hip replacements.   PROCEDURE:  Primary low segment transverse cesarean section.   SURGEON:  Randye Lobo, M.D.   ASSISTANT:  Miguel Aschoff, M.D.   ANESTHESIA:  Epidural.   IV FLUIDS:  3500 mL Ringer's lactate.   ESTIMATED BLOOD LOSS:  600 mL.   URINE OUTPUT:  150 mL.   COMPLICATIONS:  None.   INDICATIONS FOR PROCEDURE:  The patient is a 45 year old gravida 1, para 0  at 37+ weeks gestation by ultrasound who presented to maternity admissions  at the [facility] reporting bloody fluid per vagina and  cramping.  The patient reported leakage of clear fluid per vagina since  December 31, 2003, early in the day which became bloody on the morning of her  admission now when she woke up.  In maternity admissions, the patient had a  sterile speculum examination which confirmed the presence of ruptured  membranes.  The cervix was 1 cm dilation.   The patient's medical history was significant for rheumatoid arthritis and  she had a history of three prior hip replacements and was currently treated  with prednisone 5 mg daily.  Throughout the patient's pregnancy, a plan had  been made to proceed with a primary cesarean section based on the rheumatoid  arthritis, and this was scheduled for next week.   The patient was diagnosed with early labor, and a plan was made to proceed  with elective primary cesarean section. Risks, benefits, and alternatives  had been discussed with the patient.   FINDINGS:  A viable female was delivered at 7:54 a.m.  Apgars were 8 at one  minute and 9 at five minutes.  The weight was 5 pounds and 12 ounces.  There  were multiple venous sinuses noted of the anterior lower uterine segment.  The placenta was actually attached posteriorly and appeared to be somewhat  densely adherent.  The fallopian tubes and ovaries were normal.   SPECIMENS:  None.   DESCRIPTION OF PROCEDURE:  The patient was brought from maternity admissions  down to the operating room suite after she was properly identified.  The  patient did receive ampicillin 2 g intravenously and gentamicin  100 mg  intravenously for antibiotic prophylaxis due to the joint replacements.   In the operating room, epidural anesthesia was performed.  The patient was  then placed in the supine position with a left lateral tilt and the abdomen  was sterilely prepped and a Foley catheter placed inside the bladder.  She  was sterilely draped.   Adequate surgical anesthesia was insured and a Pfannenstiel incision was  then created with the scalpel.  This was carried down to the rectus fascia  using monopolar cautery which was also used to incise the fascia in the  midline.  The incision was then extended sharply with the Mayo scissors  bilaterally.  The rectus muscles were dissected sharply from the overlying  fascia both superiorly and inferiorly.  The rectus muscles were divided  sharply.  The parietal peritoneum was entered sharply with the Metzenbaum  scissors after it was elevated with two hemostat clamps.  The incision was  extended cranially and caudally using sharp dissection.   The lower uterine segment was exposed and a bladder flap was sharply  created.  A bladder retractor was used to expose the lower  uterine segment  and a transverse lower uterine segment incision was created with a  combination of both sharp and blunt dissection.  The incision was extended  bluntly.  A hand was inserted through the uterine incision and the vertex  was elevated.  The vacuum extractor was used to assist with delivery of the  fetal vertex, which was performed over two efforts.  There was one pop off.  The remainder of the newborn was delivered without difficulty.  The nares  and mouth were suctioned and the cord was doubly clamped and cut.  The  newborn was carried over to the awaiting pediatricians in vigorous  condition.   Cord blood was obtained and the placenta was then manually extracted.  A  moistened lap pad was used to wipe the uterine cavity and there were no  remaining products of conception.   The uterine incision was closed with a double layer closure of #1 chromic.  The first layer was a running locked layer and the second layer was an  imbricating layer.  There was evidence of a small amount of bleeding from  one of the suture sites of the incision of the lower uterine segment and  this responded well to a horizontal mattress suture.   Hemostasis was excellent at this time and the abdomen was suctioned of any  remaining blood.   The uterus which had been exteriorized for its closure was returned to the  peritoneal cavity.  The abdomen was closed at this time.  The peritoneum was  closed with a running suture of 2-0 Vicryl.  The rectus muscles were  reapproximated with interrupted sutures of #1 chromic.  The fascia was  closed with a running suture of 0 Vicryl.  The subcutaneous tissue was  irrigated and suctioned and made hemostatic with monopolar cautery.  The  skin was closed with staples and a pressure dressing was placed over this.   This concluded the patient's procedure.  There were no complications.  All sponge, needle and instrument counts were correct.                                                Randye Lobo, M.D.  BES/MEDQ  D:  01/01/2004  T:  01/01/2004  Job:  [id number]

## 2011-02-19 NOTE — Discharge Summary (Signed)
NAME:  Kayla [name], Kayla [name]                            ACCOUNT NO.:  [id number]   MEDICAL RECORD NO.:  [id number]                   PATIENT TYPE:  INP   LOCATION:  9308                                 FACILITY:  [facility]   PHYSICIAN:  Miguel Aschoff, M.D.                    DATE OF BIRTH:  10-29-1965   DATE OF ADMISSION:  08/24/2003  DATE OF DISCHARGE:  08/26/2003                                 DISCHARGE SUMMARY   FINAL DIAGNOSES:  1. Intrauterine pregnancy at 19-1/[redacted] weeks gestation.  2. Gross hematuria.  3. Nephrolithiasis.   COMPLICATIONS:  None.   HISTORY:  This 45 year old G2, P0-0-1-0 presents at 19-1/[redacted] weeks gestation  secondary to gross hematuria.  The patient also developed severe colicky  pain described as a 10/10.  The patient's antepartum course up to this point  was complicated by her history of juvenile rheumatoid arthritis which she is  on steroids for at this point.  The patient also has a history of asthma.  The patient is admitted at this time.  Her cervix is long and closed.  There  is no blood in the vagina.  Her uterus is measuring 19 weeks.  Her urine is  negative for leukocyte esterase and nitrates, just positive blood.  The  patient started on IV fluids.  PCA morphine for pain and an ultrasound was  ordered to assess her ureters.  The ultrasound definitely did see dilation  in one of the ureters, but did not definitely see a stone.  The patient did  have a history of nephrolithiasis about two years ago.  During patient's  hospital stay her pain did improve with IV Toradol.  Flank pain was  resolving with just a couple twinges and she was felt ready for discharge by  November 22.  She was sent home on a regular diet.  Was given Ceftin 250 mg  one b.i.d.  Was to return to the office in six days for follow-up and, of  course, to call with any increase in pain or temperatures.   DISCHARGE LABORATORIES:  The patient had a hemoglobin of 10.7, white blood  cell count of  10.3.     Leilani Able, P.A.-C.                Miguel Aschoff, M.D.    MB/MEDQ  D:  10/02/2003  T:  10/02/2003  Job:  [id number]

## 2011-02-19 NOTE — Assessment & Plan Note (Signed)
[facility]                               PULMONARY OFFICE NOTE   Kayla [name], Kayla [name]                         MRN:          [id number]  DATE:04/22/2006                            DOB:          06/08/1966    HISTORY:  A 45 year old white female previously despondent over the fact  that she just couldn't get better, with symptoms of coughing and dyspnea,  now all smiles because she says she is 100% better.  She has been  maintaining a much more strict regimen of maintenance vs. p.r.n. medications  that were reviewed with her in detail in writing and in inventory of the  face sheet, dated April 22, 2006.  The change that was recommended was that  she maintain QVAR 80 two puffs b.i.d., take prednisone 20 mg per day until  better and then taper, and now she is down to 10 mg per day for the last 2  weeks with no flare up of her symptoms.  Specifically, she denies any cough  or dyspnea.   PHYSICAL EXAMINATION:  She is a pleasant ambulatory white female who is  indeed all smiles.  VITAL SIGNS:  HEENT is unremarkable.  Vital signs ar normal.  LUNG FIELDS:  Are perfectly clear bilaterally to percussion, although breath  sounds are diminished.  There is a regular rhythm without murmur, rub or  gallop.  ABDOMEN:  Soft, benign.  EXTREMITIES:  Warm without any calf tenderness, cyanosis, clubbing or edema.   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  She is doing much better in terms of  understanding the different in a maintenance and a p.r.n. and at this point  says she is using none of p.r.n.'s but maintained on the combination of  Nexium twice daily and high dose QVAR.  In this setting, she should be able  to reduce prednisone down to 5 mg a day, her previous floor, which was  used to control rheumatoid arthritis,  understanding that she should not go  below the historical floor for rheumatism, even if her breathing remains,  her shortness of breath and cough remain quiescent.   FOLLOW UP:  Will be in 6 weeks, sooner if needed.                                   [name]. Sherene Sires, MD, [facility]   MBW/MedQ  DD:  04/22/2006  DT:  04/22/2006  Job #:  [id number]   cc:   Aundra Dubin, MD  Thora Lance, MD

## 2011-02-19 NOTE — Discharge Summary (Signed)
NAME:  Kayla [name], Kayla [name]                            ACCOUNT NO.:  [id number]   MEDICAL RECORD NO.:  [id number]                   PATIENT TYPE:  INP   LOCATION:  9308                                 FACILITY[facility]   PHYSICIAN:  Miguel Aschoff, M.D.                    DATE OF BIRTH:  10/06/1965   DATE OF ADMISSION:  08/26/2003  DATE OF DISCHARGE:  08/29/2003                                 DISCHARGE SUMMARY   FINAL DIAGNOSES:  1. Intrauterine pregnancy at 19+ weeks gestation.  2. Continued right flank pain.  3. Nephrolithiasis.   PROCEDURE:  Right ureteroscopic laser lithotripsy and a right J stent  placement with string.  This was performed by Dr. Retta Diones.   COMPLICATIONS:  None.   HISTORY:  This 45 year old G2, P0-0-1 presents to triage on November 22 with  continued right flank pain.  The patient had just been discharged from the  hospital with the same problem.  She returned secondary to continued pain  and hematuria.  She had a normal renal ultrasound that was performed and she  had been treated with morphine.  The patient's antepartum course up to this  point was complicated as well by her history of juvenile rheumatoid  arthritis which she has been on steroids for throughout her pregnancy.  At  this point a urology consultation by Dr. Retta Diones was obtained and patient  was admitted, treated with Dilaudid, PCA, and Keflex.  The patient was seen  by Dr. Retta Diones.  On his evaluation, he seemed to think that the patient  did have a stone in her right ureter.  He took her to the operating room on  November 23.  He did find a right distal uretal calculus and he performed  lithotripsy on it and did put a J stent in with a string.  Her procedure  went without complication.  After that point there was good fetal heart  tones.  The patient was felt ready for discharge on November 25.  She was  voiding well.  Her stent was in place.  She was felt ready for discharge by  the urology team.   She was sent home.  She was stable from the obstetrical  viewpoint and she was felt ready for discharge.   DISCHARGE INSTRUCTIONS:  She was sent home on Keflex 250 mg one tablet  q.12h., Ditropan 5 mg one tablet q.8h.  Was to follow up with Dr. Retta Diones  in one week and was to follow up at our office in one week as well.     Leilani Able, P.A.-C.                Miguel Aschoff, M.D.    MB/MEDQ  D:  10/02/2003  T:  10/02/2003  Job:  [id number]

## 2011-02-19 NOTE — Discharge Summary (Signed)
NAME:  Kayla [name], Kayla [name]                            ACCOUNT NO.:  [id number]   MEDICAL RECORD NO.:  [id number]                   PATIENT TYPE:  INP   LOCATION:  9104                                 [facility]AC[facility]   PHYSICIAN:  Randye Lobo, M.D.                DATE OF BIRTH:  [birth year]   DATE OF ADMISSION:  01/01/2004  DATE OF DISCHARGE:  01/04/2004                                 DISCHARGE SUMMARY   FINAL DIAGNOSES:  1. Intrauterine pregnancy at [redacted] weeks gestation.  2. Spontaneous rupture of membranes.  3. Rheumatoid arthritis with a history of multiple hip replacements.   PROCEDURE:  Primary low transverse cesarean section.  Surgeon:  Dr. Conley Simmonds.  Assistant:  Dr. Duane Lope.  Complications:  None.   This 45 year old G1 P0 presents at 37+ weeks gestation to maternity  admissions with cramping and reporting bloody fluid per vagina.  She did say  she had noticed some clear fluid since December 31, 2003 which then became  bloody.  Upon sterile speculum exam ruptured membranes was confirmed.  The  patient was about 1 cm dilated at this point.  Because of the patient's  medical history for rheumatoid arthritis and history of three prior hip  replacements, the patient was to proceed with her cesarean section which she  was scheduled for actually next week.  The patient's antepartum course had  also been complicated by advanced maternal age.  She did have an  amniocentesis performed which showed normal X,Y karyotype.  The patient also  had some nephrolithiasis which she was hospitalized for in her early  pregnancy.  She had a positive group B strep culture.  She has rheumatoid  arthritis which was stable she was on prednisone for.  At this point the  patient was taken to the operating room by Dr. Conley Simmonds where a primary  low transverse cesarean section was performed with the delivery of a 5-pound  12-ounce female infant with Apgars of 8 and 9.  Delivery went without  complications.  The  patient also was on ampicillin 2 g and gentamycin 100 mg  during her cesarean section.  The patient's postoperative course was  complicated by some postoperative anemia which she was started on iron for.  She was felt ready for discharge on postoperative day #3.  The baby was  found to have a ventricular septal defect but was discharged with the mom on  postoperative day #3.  She was sent home on a regular diet, told to decrease  activities, told to continue prenatal vitamins, told to continue iron 325 mg  one t.i.d., Percocet one to two q.4h. as needed for pain, was to follow up  in the office in 4 weeks.   LABORATORY DATA ON DISCHARGE:  The patient had a hemoglobin of 8.1, white  blood cell count of 10.6.     Leilani Able,  P.A.-C.                Randye Lobo, M.D.    MB/MEDQ  D:  01/20/2004  T:  01/21/2004  Job:  [id number]

## 2011-02-19 NOTE — Op Note (Signed)
NAME:  ANARELY, NICHOLLS                            ACCOUNT NO.:  [id number]   MEDICAL RECORD NO.:  [id number]                   PATIENT TYPE:  AMB   LOCATION:  ENDO                                 FACILITY:  [facility]   PHYSICIAN:  Bernette Redbird, M.D.                DATE OF BIRTH:  [birth year]   DATE OF PROCEDURE:  04/28/2004  DATE OF DISCHARGE:  04/28/2004                                 OPERATIVE REPORT   PROCEDURE PERFORMED:  Esophageal manometry.   INDICATIONS FOR PROCEDURE:  The patient is a 45 year old with refractory  reflux type symptoms unresponsive to proton pump inhibitor therapy, being  considered for possible antireflux surgery although objective manifestations  of reflux have been lacking.   FINDINGS:  Normal exam.   The nature and purpose of the procedure had been discussed with the patient,  who provided written consent.  It was done as an outpatient by the endoscopy  nurse as the [facility] endoscopy unit using a transnasal solid state  manometry catheter.   FINDINGS:  1. Lower esophageal sphincter.  The lower esophageal sphincter resting     pressure was 23 mmHg which was well in the normal range (10 to 45).  It     relaxed 81%.  It had appropriate relaxation with swallows.  2. Esophageal body.  Amplitudes and durations of contractions were in the     normal range with minimal amplitudes of about 30 mmHg and maximal     amplitudes 91 mmHg.  80% of contractions were peristaltic, 10% were     simultaneous and 10% were retrograde.  The latter findings are not likely     significant.  No spontaneous or repetitive activity was noted.  3. Upper esophageal sphincter.  The upper esophageal sphincter had somewhat     elevated pressure of 217 mmHg (normal is up to 118) but the clinical     significance of this observation was uncertain.  It relaxed normally in     associated with pharyngeal contractions.   IMPRESSION:  No significant abnormalities in esophageal manometry  identified.  In particular, I see no manometric contraindication to  antireflux surgery if that were felt to be clinically needed.                                               Bernette Redbird, M.D.    RB/MEDQ  D:  05/06/2004  T:  05/06/2004  Job:  [id number]   cc:   Thora Lance, M.D.  [address]  [place]  [place] [address]  Fax: [phone]   [place]. Daphine Deutscher, M.D.  [address], [address]  [place]  [place] [address]  Fax: [phone]   [name]. Sherene Sires, M.D. [facility]   Zola Button  Cecile Hearing, M.D.  [address] [place]  [place] [address]  Fax: [phone]

## 2011-02-19 NOTE — Consult Note (Signed)
NAME:  Kayla [name], GUAGLIARDO                            ACCOUNT NO.:  [id number]   MEDICAL RECORD NO.:  [id number]                   PATIENT TYPE:  MAT   LOCATION:  MATC                                 FACILITY:  [facility]   PHYSICIAN:  Bertram Millard. Dahlstedt, M.D.          DATE OF BIRTH:  [birth year]   DATE OF CONSULTATION:  08/26/2003  DATE OF DISCHARGE:                                   CONSULTATION   REASON FOR CONSULTATION:  Right flank pain.   BRIEF HISTORY:  A 45 year old female fairly new to [place] with a prior  history of passing a kidney stone approximately 20 years ago.  She presented  to Dr. Tenny Craw last Saturday with gross hematuria followed by intense right  flank pain which in questioning was colicky in nature.  This was associated  with nausea, but no vomiting.  She has had no gross hematuria since that  time.  The week before she was treated for symptoms of urgency and  frequency.  Those symptoms have resolved at the present time.  She was  admitted over the weekend for right flank pain which was resolved by this  morning, Monday, November 22.  She represented to medical attention after 1  p.m. today complaining of recurrence of this right flank pain which was  fairly severe in nature.  Urologic consultation is requested to rule out a  kidney stone.   The patient had a renal ultrasound performed while in the hospital over the  weekend.  This showed normal kidneys without evidence of hyperechoic areas  consistent with stones.  There was no hydronephrosis and ureteral jets were  seen in the bladder bilaterally.   PAST MEDICAL HISTORY:  1. Rheumatoid arthritis.  2. She has had bilateral TKAs with revisions.  3. 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  4. Asthma.  5. She has a history of passing that one stone before.  6. She has GERD.   MEDICATIONS:  1. Protonix.  2. Prednisone 5 mg daily.  3. Pulmicort inhaler.   ALLERGIES:  SULFA.   SOCIAL HISTORY:   She is married.  This is her first pregnancy.  She and her  husband just moved from [place].  She is a native of [place].  She  does not work.  She does not use tobacco.   FAMILY HISTORY:  Significant for kidney stones.   REVIEW OF SYSTEMS:  Otherwise noncontributory.  Please see HPI.   PHYSICAL EXAMINATION:  GENERAL:  Pleasant adult female.  VITAL SIGNS:  Blood pressure 103/68, pulse 84, respiratory rate 20,  temperature 97.8.  HEENT:  Normal.  NECK:  Supple without thyromegaly.  LYMPH:  No adenopathy was present in cervical or supraclavicular area.  LUNGS:  Clear throughout.  HEART:  Regular rate and rhythm.  No rubs, gallops, or murmurs noted.  ABDOMEN:  Gravid, soft, nondistended, nontender.  No hepatosplenomegaly or  masses  were noted.  No rebound or guarding was noted.  No CVA tenderness was  present.  PELVIC:  Not performed.  EXTREMITIES:  Consistent with changes of rheumatoid arthritis.  NEUROLOGIC:  Grossly intact.   LABORATORIES:  Urinalysis from November 20 revealed few bacteria, too  numerous to count red cells, and rare squamous epithelial cells.  It was  otherwise normal except dip positive for hemoglobin.   CMET was normal except for an albumin of 3.1 and an SGPT of 53.   I reviewed the patient's IVP.  This revealed calcific changes of the abdomen  consistent with a fetus.  I did not see any calcifications overlying either  renal contour but there was some overlying gas and stool.  After  administration of the contrast there was prompt uptake but she had  pyelocaliectasis, mild in nature, on the right.  Left pelvis and ureter were  normal with the ureter seen all the way down to the bladder.  I did not see  the ureter on the right below the pelvis even on the delayed film.   Ultrasound after the CT revealed ureteral jets seen bilaterally.   IMPRESSION:  1. Intrauterine pregnancy, 19-1/2 weeks.  2. Right flank pain, possible right ureteral stone.  3.  History of ureteral calculi.  4. History of recent urinary tract infection.   PLAN:  1. Will cover patient with Keflex 500 mg daily.  2. Will admit patient for pain management, put her on a Dilaudid PCA.  3. Will get a delayed KUB to assess the progression of the contrast down the     right ureter.  4. I discussed intervention with the patient and her husband.  If she     persists with pain over the next day or two, will recommend cystoscopy,     right retrograde, and possible stone extraction.                                               Bertram Millard. Dahlstedt, M.D.    SMD/MEDQ  D:  08/26/2003  T:  08/26/2003  Job:  [id number]   cc:   C. Duane Lope, M.D.  [address]  [place]  [place] [address]  Fax: [phone]

## 2011-02-19 NOTE — Assessment & Plan Note (Signed)
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 albeit  while on a higher dose of prednisone to control her systemic disease.  She  denies any decline in exercise tolerance or limiting dyspnea, fever, chills,  sweats, orthopnea, PND, or nocturnal wheeze, or need for rescue therapy at  prednisone at 10 mg per day.   For full rheumatory medication, please see facesheet dated June 03, 2006.   PHYSICAL EXAMINATION:  GENERAL:  She is a pleasant, middle-aged, white  female, minimally Cushingoid, no acute distress.  VITAL SIGNS:  Stable.  HEENT:  Unremarkable.  Eyes clear.  NECK:  Supple without cervical adenopathy or tenderness.  Trachea is  midline.  LUNGS:  Lung fields reveal a slightly diminished breath sound bilaterally.  No wheezing.  There is a moderate decrease in breath sounds bilaterally.  No  wheezing.  There is moderate increased end-expiratory time.  CARDIAC:  Regular rhythm without murmur, gallop, or rub.  ABDOMEN:  Soft, benign.  EXTREMITIES:  Warm without calf tenderness, cyanosis, or clubbing.   Oxygen saturation 95% on room air.   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  Since she has no  symptoms at all, I was tempted to reduce the QVAR down to 1 puff b.i.d. but  reminded her that the reason she may be doing so well is because  of the  higher dose of systemic steroids and, as her prednisone is tapered, it may  be tapered back to her previous __________  mg per day maybe necessary to  go back up to the 2 puffs b.i.d. dosing on QVAR.   Other than this one contingency, I have no additional recommendations at  this point.  Followup can, therefore, be every 3 months.                                  [name]. Sherene Sires, MD, [facility]   MBW/MedQ  DD:  06/03/2006  DT:  06/03/2006  Job #:  [id number]   cc:   Thora Lance, MD

## 2011-02-19 NOTE — Op Note (Signed)
NAME:  Kayla [name], Kayla [name]                            ACCOUNT NO.:  [id number]   MEDICAL RECORD NO.:  [id number]                   PATIENT TYPE:  AMB   LOCATION:  DAY                                  FACILITY:  [facility]   PHYSICIAN:  Bertram Millard. Dahlstedt, M.D.          DATE OF BIRTH:  07-19-1966   DATE OF PROCEDURE:  08/28/2003  DATE OF DISCHARGE:                                 OPERATIVE REPORT   PREOPERATIVE DIAGNOSIS:  Right flank pain, right hydronephrosis.   POSTOPERATIVE DIAGNOSIS:  Right distal ureteral calculus.   PROCEDURES:  1. Cystoscopy.  2. Right retrograde pyelogram.  3. Right double J ureteral stent placement with string.   SURGEON:  Bertram Millard. Retta Diones, M.D.   ASSISTANT:  Susanne Borders, M.D.   ANESTHESIA:  Spinal.   ESTIMATED BLOOD LOSS:  None.   COMPLICATIONS:  None.   DRAINS:  22 cm x 6 [place] double J stent with tether.   DISPOSITION:  To postanesthesia care unit in stable condition.   INDICATION FOR PROCEDURE:  Kayla [name] is a 45 year old female who is [redacted] weeks  pregnant.  She has a history of passing a stone approximately 20 years ago.  She was recently admitted for right flank pain and was evaluated by Dr.  Retta Diones.  A limited intravenous pyelogram was performed, which  demonstrated a right pelvocaliectasis with inadequate visualization of the  right ureter.  It is presumed that she had a stone given her dilated  collecting system, history of stones, and flank pain with hematuria.  The  treatment options were discussed with the patient and given her pregnancy,  the safest option is a right retrograde pyelogram with possible right  ureteroscopy using limited fluoroscopy.  She consented to this after  understanding the risks, benefits, and alternatives.   DESCRIPTION OF PROCEDURE:  The patient was brought to the operating room and  correctly identified by her identification bracelet.  She was given  preoperative antibiotics and spinal anesthesia.  She was  very carefully  placed in the dorsal lithotomy position as she has a history of bilateral  knee replacements as well as a hip replacement.  Her genitalia were prepped  and draped in typical sterile fashion.  A 22 French cystoscope with a 12  degree lens was used to enter the patient's urethra.  A survey of the  patient's bladder revealed no tumors, foreign bodies, stones, or mucosal  abnormalities.  Bilateral ureteral orifices were identified in the normal  anatomic location.  The right ureteral orifice was cannulated with a 6  French end-hole ureteral catheter and approximately 7 mL of Cysto-Conray was  injected through the stent.  There was an area approximately one-third of  the distance of her ureter from the ureterovesical junction at which point  contrast could not readily pass.  Eventually contrast was passed around this  area and there was an obvious filling defect in this area, consistent  with a  stone.  A 0.38 mm guidewire was passed through the end-hole ureteral  catheter, and the catheter and cystoscope were removed.  The 6 French rigid  ureteroscope was then passed into the bladder and carefully passed into the  patient's right ureteral orifice and up to the level of the filling defect.  Indeed, there was an approximately 5 mm calculus in this location.  It was  felt that this calculus was too large to remove in its entirety; therefore,  365 micron laser fiber was used at settings of 0.8 joules and 5 hertz, was  used to carefully fragment the stone into several small pieces.  A nitinol  basket was then used to grasp the fragments and remove them from the ureter.  On final ureteroscopic examination there was no further stone material.  The  area where the stone had been lodged was somewhat edematous but not  strictured.  The ureteroscope was removed and the cystoscope was back-loaded  over the wire.  A double J ureteral stent was passed over the guidewire with  a good curl  visualized under fluoroscopy in the right renal pelvis and  another curl visualized directly in the bladder.  The patient's bladder was  drained and she was taken to the postanesthesia care unit in stable  condition, having tolerated the procedure well.  Please note that Dr.  Retta Diones was present and participated in all aspects of this case as he is  the primary surgeon.     Susanne Borders, MD                           Bertram Millard. Dahlstedt, M.D.    DR/MEDQ  D:  08/28/2003  T:  08/28/2003  Job:  [id number]

## 2011-05-18 ENCOUNTER — Other Ambulatory Visit: Payer: Self-pay | Admitting: Internal Medicine

## 2011-07-02 LAB — POCT I-STAT, CHEM 8
BUN: 22
Calcium, Ion: 1.05 — ABNORMAL LOW
Chloride: 107
Creatinine, Ser: 0.8
Glucose, Bld: 88
HCT: 40
Hemoglobin: 13.6
Potassium: 3.9
Sodium: 142
TCO2: 27

## 2011-08-23 DIAGNOSIS — H2010 Chronic iridocyclitis, unspecified eye: Secondary | ICD-10-CM | POA: Insufficient documentation

## 2011-08-23 DIAGNOSIS — H4040X0 Glaucoma secondary to eye inflammation, unspecified eye, stage unspecified: Secondary | ICD-10-CM | POA: Insufficient documentation

## 2011-09-01 DIAGNOSIS — M0800 Unspecified juvenile rheumatoid arthritis of unspecified site: Secondary | ICD-10-CM | POA: Insufficient documentation

## 2011-09-06 DIAGNOSIS — Z79899 Other long term (current) drug therapy: Secondary | ICD-10-CM | POA: Insufficient documentation

## 2011-09-17 ENCOUNTER — Other Ambulatory Visit: Payer: Self-pay | Admitting: Internal Medicine

## 2011-09-17 DIAGNOSIS — E229 Hyperfunction of pituitary gland, unspecified: Secondary | ICD-10-CM

## 2011-09-22 ENCOUNTER — Ambulatory Visit
Admission: RE | Admit: 2011-09-22 | Discharge: 2011-09-22 | Disposition: A | Discharge status: Home / Self Care | Payer: BC Managed Care – PPO | Source: Ambulatory Visit | Attending: Internal Medicine | Admitting: Internal Medicine

## 2011-09-22 DIAGNOSIS — E229 Hyperfunction of pituitary gland, unspecified: Secondary | ICD-10-CM

## 2011-09-22 MED ORDER — GADOBENATE DIMEGLUMINE 529 MG/ML IV SOLN
6.0000 mL | Freq: Once | INTRAVENOUS | Status: AC | PRN
  Administered 2011-09-22: 6 mL via INTRAVENOUS

## 2011-12-27 ENCOUNTER — Encounter: Payer: Self-pay | Admitting: *Deleted

## 2011-12-27 ENCOUNTER — Ambulatory Visit (INDEPENDENT_AMBULATORY_CARE_PROVIDER_SITE_OTHER): Payer: 59 | Admitting: Adult Health

## 2011-12-27 DIAGNOSIS — J45909 Unspecified asthma, uncomplicated: Secondary | ICD-10-CM

## 2011-12-27 MED ORDER — HYDROCODONE-HOMATROPINE 5-1.5 MG/5ML PO SYRP: 5.0000 mL | ORAL_SOLUTION | Freq: Four times a day (QID) | ORAL | Status: AC | PRN

## 2011-12-27 MED ORDER — ESOMEPRAZOLE MAGNESIUM 40 MG PO CPDR: 40.0000 mg | DELAYED_RELEASE_CAPSULE | Freq: Every day | ORAL | Status: DC

## 2011-12-27 MED ORDER — CEFDINIR 300 MG PO CAPS: 300.0000 mg | ORAL_CAPSULE | Freq: Two times a day (BID) | ORAL | Status: AC

## 2011-12-27 NOTE — Progress Notes (Signed)
Subjective:    Patient ID: Kayla [name], female    DOB: March 24, 1966, 46 y.o.   MRN: [id number]  HPI 15 yowf never smoker with a history of chronic asthma and severe deforming rheumatoid arthritis since which had been steroid-dependent since she was in her 30s with her last FEV1 documented at 1.05 or 44% predicted with a ratio 39% on 01/28/06 with 8% improvement in bronchodilator normal diffusing capacity with ddx = chronic asthma vs rheumatoid bronchiolitis.  She weaned herself off of prednisone successfully x > one month in 2/09   June 20, 2008 ov reports arthitis flared about a month prior to ov rx with prednisone with worse breathing as tapered off but no increase cough, doe = across parking lot stops. Arthritis also worse  WUJ:WJXB technique so work on technique and double prednisone > no improvement   August 01, 2008 ov: worse since 10/23 with cough prod green on pred 10 mg per day and using rescue inhaler frequently without improvement. --rx avelox   November 13, 2008 --Presents for 1 week of productive cough w/ green/yellow mucous malaise, barking cough. Currently being evaluated at [facility] for GERD, and pulmonary eval. Still undergoing test- no referral notes yet.   August 19, 2009 c/o sob worse x3 days, started as a cold, usually dry cough,wheezing. Followed contingency instructions to use PPI and mucinex.   >>rx Abx and steroid taper.   12/27/2011 Acute OV  Pt complains of prod cough, increased SOB, wheezing, fever x2weeks Last seen 2010 , has been following at [facility] for her pulmonary issues.  Ran out of Nexium for 2 months . Takes Advair - no missed Advair  No Hospitalizations or ER visit. On average rare use of SABA. Last 2 weeks , using SABA 1 time daily. Going to beach next week.  Taking Celebrex and Orencia injections  For RA .  No recent steroid use.  No chest pain or hemoptysis . No edema.    Past Medical History:  COUGH (ICD-786.2)  RHINITIS, CHRONIC (ICD-472.0)    ASTHMA (ICD-493.90)  - PFTs 01/28/06 FEV1 44% ratio 39% diffusing capacity hundred and 16% with 8% improvement after B2-- [facility] pulm/GI 10/2008  ARTHRITIS, RHEUMATOID, SEVERE (ICD-714.0)............................................Marland KitchenSchriver @ [facility]  - Sept 17 2009 started daily prednisone > tapered off     Review of Systems Constitutional:   No  weight loss, night sweats,  Fevers, chills, + fatigue, or  lassitude.  HEENT:   No headaches,  Difficulty swallowing,  Tooth/dental problems, or  Sore throat,                No sneezing, itching, ear ache, nasal congestion, post nasal drip,   CV:  No chest pain,  Orthopnea, PND, swelling in lower extremities, anasarca, dizziness, palpitations, syncope.   GI  No heartburn, indigestion, abdominal pain, nausea, vomiting, diarrhea, change in bowel habits, loss of appetite, bloody stools.   Resp:    No coughing up of blood.     No chest wall deformity  Skin: no rash or lesions.  GU: no dysuria, change in color of urine, no urgency or frequency.  No flank pain, no hematuria      Psych:  No change in mood or affect. No depression or anxiety.  No memory loss.          Objective:   Physical Exam GEN: A/Ox3; pleasant , NAD, well nourished   HEENT:  [place]/AT,  EACs-clear, TMs-wnl, NOSE-clear, THROAT-clear, no lesions, no postnasal drip or exudate noted.  NECK:  Supple w/ fair ROM; no JVD; normal carotid impulses w/o bruits; no thyromegaly or nodules palpated; no lymphadenopathy.  RESP  Coarse BS  No wheezes/ rales/ or rhonchi.no accessory muscle use, no dullness to percussion  CARD:  RRR, no m/r/g  , no peripheral edema, pulses intact, no cyanosis or clubbing.  GI:   Soft & nt; nml bowel sounds; no organomegaly or masses detected.  Musco: Warm bil, no deformities or joint swelling noted.   Neuro: alert, no focal deficits noted.    Skin: Warm, no lesions or rashes         Assessment & Plan:

## 2011-12-27 NOTE — Patient Instructions (Signed)
Omnicef 300mg  Twice daily  For 7 days  Mucinex DM Twice daily  As needed  Cough/congestion  Fluids and rest  Restart Nexium 40mg  daily  Hydromet 1-2 tsp every 4-6 hrs As needed  Cough- may make you sleepy.  Please contact office for sooner follow up if symptoms do not improve or worsen or seek emergency care  follow up Dr. Sherene Sires  In 3 months

## 2011-12-27 NOTE — Assessment & Plan Note (Signed)
Bronchitis with mild Asthma flare   Plan:  Omnicef 300mg  Twice daily  For 7 days  Mucinex DM Twice daily  As needed  Cough/congestion  Fluids and rest  Restart Nexium 40mg  daily  Hydromet 1-2 tsp every 4-6 hrs As needed  Cough- may make you sleepy.  Please contact office for sooner follow up if symptoms do not improve or worsen or seek emergency care  follow up Dr. Sherene Sires  In 3 months

## 2012-02-20 DIAGNOSIS — H04123 Dry eye syndrome of bilateral lacrimal glands: Secondary | ICD-10-CM | POA: Insufficient documentation

## 2012-03-22 ENCOUNTER — Encounter: Payer: Self-pay | Admitting: Internal Medicine

## 2012-03-22 ENCOUNTER — Ambulatory Visit (INDEPENDENT_AMBULATORY_CARE_PROVIDER_SITE_OTHER): Payer: 59 | Admitting: Internal Medicine

## 2012-03-22 VITALS — BP 118/88 | HR 85 | Temp 98.0°F | Ht 59.0 in | Wt 131.8 lb

## 2012-03-22 DIAGNOSIS — J45909 Unspecified asthma, uncomplicated: Secondary | ICD-10-CM

## 2012-03-22 MED ORDER — MOMETASONE FURO-FORMOTEROL FUM 200-5 MCG/ACT IN AERO: INHALATION_SPRAY | RESPIRATORY_TRACT | Status: DC

## 2012-03-22 NOTE — Progress Notes (Signed)
Subjective:    Patient ID: Kayla [name], female    DOB: November 16, 1965    MRN: [id number]  HPI 34 yowf never smoker with a history of chronic asthma and severe deforming rheumatoid arthritis since which had been steroid-dependent since she was in her 30s with her last FEV1 documented at 1.05 or 44% predicted with a ratio 39% on 01/28/06 with 8% improvement in bronchodilator normal diffusing capacity with ddx = chronic asthma vs rheumatoid bronchiolitis.  She weaned herself off of prednisone successfully x > one month in 2/09   June 20, 2008 ov reports arthitis flared about a month prior to ov rx with prednisone with worse breathing as tapered off but no increase cough, doe = across parking lot stops. Arthritis also worse  NWG:NFAO technique so work on technique and double prednisone > no improvement   August 01, 2008 ov: worse since 10/23 with cough prod green on pred 10 mg per day and using rescue inhaler frequently without improvement. --rx avelox   November 13, 2008 --Presents for 1 week of productive cough w/ green/yellow mucous malaise, barking cough. Currently being evaluated at [facility] for GERD, and pulmonary eval. Still undergoing test- no referral notes yet.   August 19, 2009 c/o sob worse x3 days, started as a cold, usually dry cough,wheezing. Followed contingency instructions to use PPI and mucinex.   >>rx Abx and steroid taper.   12/27/2011 Acute OV  Pt complains of prod cough, increased SOB, wheezing, fever x2weeks Last seen 2010 , has been following at [facility] for her pulmonary issues.  Ran out of Nexium for 2 months . Takes Advair - no missed Advair  No Hospitalizations or ER visit. On average rare use of SABA. Last 2 weeks , using SABA 1 time daily. Going to beach next week.  Taking Celebrex and Orencia injections  For RA .  No recent steroid use.  rec Omnicef 300mg  Twice daily  For 7 days  Mucinex DM Twice daily  As needed  Cough/congestion  Fluids and rest  Restart Nexium  40mg  daily  Hydromet 1-2 tsp every 4-6 hrs As needed  Cough- may make you sleepy.   03/22/2012 f/u ov/Andren Bethea no longer on prednisone at all for RA cc persistent chronic doe x walking dog x sev minutes, worse with hills, does fine at rest, using saba sev times per day with some improvement but not relief of sob despite advair 500,  Denies overt hb or sinus complaints or variability to doe, no sign cough.  Sleeping ok without nocturnal  or early am exacerbation  of respiratory  c/o's or need for noct saba. Also denies any obvious fluctuation of symptoms with weather or environmental changes or other aggravating or alleviating factors except as outlined above   ROS  At present neg for  any significant sore throat, dysphagia, dental problems, itching, sneezing,  nasal congestion or excess/ purulent secretions, ear ache,   fever, chills, sweats, unintended wt loss, pleuritic or exertional cp, hemoptysis, palpitations, orthopnea pnd or leg swelling.  Also denies presyncope, palpitations, heartburn, abdominal pain, anorexia, nausea, vomiting, diarrhea  or change in bowel or urinary habits, change in stools or urine, dysuria,hematuria,  rash,   visual complaints, headache, numbness weakness or ataxia or problems with walking or coordination. No noted change in mood/affect or memory.       Past Medical History:  COUGH (ICD-786.2)  RHINITIS, CHRONIC (ICD-472.0)  ASTHMA (ICD-493.90)  - PFTs 01/28/06 FEV1 44% ratio 39% diffusing capacity hundred and 16%  with 8% improvement after B2-- [facility] pulm/GI 10/2008  ARTHRITIS, RHEUMATOID, SEVERE (ICD-714.0)............................................Marland KitchenSchriver @ [facility]  - Sept 17 2009 started daily prednisone > tapered off               Objective:   Physical Exam  Wt Readings from Last 3 Encounters:  03/22/12 131 lb 12.8 oz (59.784 kg)  08/19/09 125 lb 2.1 oz (56.759 kg)  11/13/08 122 lb 4 oz (55.452 kg)    GEN: A/Ox3; pleasant , NAD, well nourished   HEENT:   [place]/AT,  EACs-clear, TMs-wnl, NOSE-clear, THROAT-clear, no lesions, no postnasal drip or exudate noted.   NECK:  Supple w/ fair ROM; no JVD; normal carotid impulses w/o bruits; no thyromegaly or nodules palpated; no lymphadenopathy.  RESP  slt distant  BS  No wheezes/ rales/ or rhonchi.no accessory muscle use, no dullness to percussion  CARD:  RRR, no m/r/g  , no peripheral edema, pulses intact, no cyanosis or clubbing.  GI:   Soft & nt; nml bowel sounds; no organomegaly or masses detected.  Musco: Warm bil, severe ra changes bilaterally   Neuro: alert, no focal deficits noted.    Skin: Warm, no lesions or rashes         Assessment & Plan:

## 2012-03-22 NOTE — Assessment & Plan Note (Signed)
Cough is better but not sob and plans to go to [facility] for pulm/rheum f/u but in meantime ? Asthma not well controlled.  DDX of  difficult airways managment all start with A and  include Adherence, Ace Inhibitors, Acid Reflux, Active Sinus Disease, Alpha 1 Antitripsin deficiency, Anxiety masquerading as Airways dz,  ABPA,  allergy(esp in young), Aspiration (esp in elderly), Adverse effects of DPI,  Active smokers, plus two Bs  = Bronchiectasis and Beta blocker use..and one C= CHF   Adherence is always the initial "prime suspect" and is a multilayered concern that requires a "trust but verify" approach in every patient - starting with knowing how to use medications, especially inhalers, correctly, keeping up with refills and understanding the fundamental difference between maintenance and prns vs those medications only taken for a very short course and then stopped and not refilled.   The proper method of use, as well as anticipated side effects, of a metered-dose inhaler are discussed and demonstrated to the patient. Improved effectiveness after extensive coaching during this visit to a level of approximately  75% so worth a try on dulera200 2 bid as breaking the rule of 2s on advair 500  ? Adverse effect of dpi > change to hfa trial basis  ? RA bronchiolitis > f/u [facility] planned

## 2012-03-22 NOTE — Patient Instructions (Addendum)
Dulera 200 Take 2 puffs first thing in am and then another 2 puffs about 12 hours later.   Only use your albuterol (maxair) as a rescue medication to be used if you can't catch your breath by resting or doing a relaxed purse lip breathing pattern. The less you use it, the better it will work when you need it. The goal is to use it less than twice daily.  Work on inhaler technique:  relax and gently blow all the way out then take a nice smooth deep breath back in, triggering the inhaler at same time you start breathing in.  Hold for up to 5 seconds if you can.  Rinse and gargle with water when done   If your mouth or throat starts to bother you,   I suggest you time the inhaler to your dental care and after using the inhaler(s) brush teeth and tongue with a baking soda containing toothpaste and when you rinse this out, gargle with it first to see if this helps your mouth and throat.      If you are satisfied with your treatment plan let your doctor know and he/she can either refill your medications or you can return here when your prescription runs out.     If in any way you are not 100% satisfied,  please tell us.  If 100% better, tell your friends!

## 2012-04-21 ENCOUNTER — Telehealth: Payer: Self-pay | Admitting: Internal Medicine

## 2012-04-21 MED ORDER — MOMETASONE FURO-FORMOTEROL FUM 200-5 MCG/ACT IN AERO: INHALATION_SPRAY | RESPIRATORY_TRACT | Status: DC

## 2012-04-21 NOTE — Telephone Encounter (Signed)
Spoke with pt and notified can refill the rx for her, but since seeing her back prn now she can have her PCP refill in the future. She states does not want this, wants [facility] to refill med each time. I advised in that case will send her a year supply and advised her to call before runs out for appt. She verbalized understanding and rxs were sent to the pharm. Nothing further needed.

## 2012-04-21 NOTE — Telephone Encounter (Signed)
ATC, NA and no option to leave a msg, WCB 

## 2013-08-15 HISTORY — PX: REVISION TOTAL KNEE ARTHROPLASTY: SHX767

## 2013-10-03 ENCOUNTER — Ambulatory Visit: Payer: 59 | Admitting: Internal Medicine

## 2014-01-07 ENCOUNTER — Telehealth: Payer: Self-pay | Admitting: Internal Medicine

## 2014-01-07 NOTE — Telephone Encounter (Signed)
  Kayla [name], CMA at 04/21/2012 11:39 AM     Status: Signed        Spoke with pt and notified can refill the rx for her, but since seeing her back prn now she can have her PCP refill in the future. She states does not want this, wants [facility] to refill med each time. I advised in that case will send her a year supply and advised her to call before runs out for appt. She verbalized understanding and rxs were sent to the pharm. Nothing further needed  --  Last OV 03/22/12 No pending appt Last appt scheduled was cancelled Called spoke with pt. Made her aware MW has not seen her since 2013 and pt did not want to make appt. She reports she was tired of going bacl and forth to doctors offices. Advised pt to call her PCP for refill. Pt then began to yell at me on the phone, then stated how stress she was right now and we don't understand. Pt hung up the phone. Will sign off messge

## 2014-03-29 ENCOUNTER — Other Ambulatory Visit: Payer: Self-pay | Admitting: Internal Medicine

## 2014-03-29 DIAGNOSIS — E229 Hyperfunction of pituitary gland, unspecified: Secondary | ICD-10-CM

## 2014-04-08 ENCOUNTER — Ambulatory Visit
Admission: RE | Admit: 2014-04-08 | Discharge: 2014-04-08 | Disposition: A | Discharge status: Home / Self Care | Payer: Medicare Other | Source: Ambulatory Visit | Attending: Internal Medicine | Admitting: Internal Medicine

## 2014-04-08 DIAGNOSIS — E229 Hyperfunction of pituitary gland, unspecified: Secondary | ICD-10-CM

## 2014-04-08 MED ORDER — GADOBENATE DIMEGLUMINE 529 MG/ML IV SOLN
6.0000 mL | Freq: Once | INTRAVENOUS | Status: AC | PRN
  Administered 2014-04-08: 6 mL via INTRAVENOUS

## 2014-10-11 ENCOUNTER — Ambulatory Visit (INDEPENDENT_AMBULATORY_CARE_PROVIDER_SITE_OTHER): Payer: Medicare Other | Admitting: Internal Medicine

## 2014-10-11 ENCOUNTER — Encounter: Payer: Self-pay | Admitting: Internal Medicine

## 2014-10-11 ENCOUNTER — Encounter (INDEPENDENT_AMBULATORY_CARE_PROVIDER_SITE_OTHER): Payer: Self-pay

## 2014-10-11 VITALS — BP 104/80 | HR 74 | Ht 59.0 in | Wt 140.6 lb

## 2014-10-11 DIAGNOSIS — J45901 Unspecified asthma with (acute) exacerbation: Secondary | ICD-10-CM

## 2014-10-11 DIAGNOSIS — J4550 Severe persistent asthma, uncomplicated: Secondary | ICD-10-CM

## 2014-10-11 NOTE — Progress Notes (Signed)
Subjective:    Patient ID: Kayla [name], female    DOB: 03/08/1966    MRN: [id number]   Brief patient profile:  50 yowf never smoker with a history of chronic asthma and severe deforming rheumatoid arthritis since which had been steroid-dependent since she was in her 49s with her last FEV1 documented at 1.05 or 44% predicted with a ratio 39% on 01/28/06 with 8% improvement in bronchodilator normal diffusing capacity with ddx = chronic asthma vs rheumatoid bronchiolitis.  She weaned herself off of prednisone successfully x > one month in 2/09    History of Present Illness  03/22/2012 f/u ov/Kayla [name] no longer on prednisone at all for RA cc persistent chronic doe x walking dog x sev minutes, worse with hills, does fine at rest, using saba sev times per day with some improvement but not relief of sob despite advair 500 rec Dulera 200 Take 2 puffs first thing in am and then another 2 puffs about 12 hours later.  Only use your albuterol (maxair) as a rescue medication   Work on inhaler technique:   10/11/2014 f/u ov/Kayla [name] re: asthma/  dulera 200 2bid maint / prn [place] for RA per [facility] Chief Complaint  Patient presents with  . Follow-up    Pt states that her breathing "is crappy as it has always been". She is using rescue inhaler very rarely.   using recumbent bike x 30 min daily s stopping    Sleeping fine, cough not an issue No longer walking dog    No obvious day to day or daytime variabilty or assoc chronic cough or cp or chest tightness, subjective wheeze overt sinus or hb symptoms. No unusual exp hx or h/o childhood pna/ asthma or knowledge of premature birth.  Sleeping ok without nocturnal  or early am exacerbation  of respiratory  c/o's or need for noct saba. Also denies any obvious fluctuation of symptoms with weather or environmental changes or other aggravating or alleviating factors except as outlined above   Current Medications, Allergies, Complete Past Medical History, Past  Surgical History, Family History, and Social History were reviewed in [employer] record.  ROS  The following are not active complaints unless bolded sore throat, dysphagia, dental problems, itching, sneezing,  nasal congestion or excess/ purulent secretions, ear ache,   fever, chills, sweats, unintended wt loss, pleuritic or exertional cp, hemoptysis,  orthopnea pnd or leg swelling, presyncope, palpitations, heartburn, abdominal pain, anorexia, nausea, vomiting, diarrhea  or change in bowel or urinary habits, change in stools or urine, dysuria,hematuria,  rash, arthralgias, visual complaints, headache, numbness weakness or ataxia or problems with walking or coordination,  change in mood/affect or memory.            Past Medical History:  COUGH (ICD-786.2)  RHINITIS, CHRONIC (ICD-472.0)  ASTHMA (ICD-493.90)  - PFTs 01/28/06 FEV1 44% ratio 39% diffusing capacity  116% with 8% improvement after B2-- [facility] pulm/GI 10/2008  ARTHRITIS, RHEUMATOID, SEVERE (ICD-714.0)............................................Marland KitchenSchriver @ [facility]  - Sept 17 2009 started daily prednisone > tapered off               Objective:   Physical Exam    10/11/2014          141  Wt Readings from Last 3 Encounters:  03/22/12 131 lb 12.8 oz (59.784 kg)  08/19/09 125 lb 2.1 oz (56.759 kg)  11/13/08 122 lb 4 oz (55.452 kg)    GEN: A/Ox3; pleasant , NAD, well nourished  HEENT:  [place]/AT,  EACs-clear, TMs-wnl, NOSE-clear, THROAT-clear, no lesions, no postnasal drip or exudate noted.   NECK:  Supple w/ fair ROM; no JVD; normal carotid impulses w/o bruits; no thyromegaly or nodules palpated; no lymphadenopathy.  RESP  slt distant  BS  No wheezes/ rales/ or rhonchi.no accessory muscle use, no dullness to percussion  CARD:  RRR, no m/r/g  , no peripheral edema, pulses intact, no cyanosis or clubbing.  GI:   Soft & nt; nml bowel sounds; no organomegaly or masses detected.  Musco: Warm bil, severe ra  changes bilaterally both hands   Neuro: alert, no focal deficits noted.    Skin: Warm, no lesions or rashes         Assessment & Plan:

## 2014-10-11 NOTE — Patient Instructions (Addendum)
Continue dulera 200 Take 2 puffs first thing in am and then another 2 puffs about 12 hours later.   Try using maxair 5 min before exercise to see what difference it makes  GERD (REFLUX)  is an extremely common cause of respiratory symptoms just like yours , many times with no obvious heartburn at all.    It can be treated with medication, but also with lifestyle changes including avoidance of late meals, excessive alcohol, smoking cessation, and avoid fatty foods, chocolate, peppermint, colas, red wine, and acidic juices such as orange juice.  NO MINT OR MENTHOL PRODUCTS SO NO COUGH DROPS  USE SUGARLESS CANDY INSTEAD (Jolley ranchers or Stover's or Life Savers) or even ice chips will also do - the key is to swallow to prevent all throat clearing. NO OIL BASED VITAMINS - use powdered substitutes.    Please schedule a follow up visit in 3 months but call sooner if needed pfts / cxr

## 2014-10-12 ENCOUNTER — Encounter: Payer: Self-pay | Admitting: Internal Medicine

## 2014-10-12 NOTE — Assessment & Plan Note (Addendum)
PFTs 01/28/06 FEV1 44% ratio 39% diffusing capacity  116% with 8% improvement after B2  - 10/11/2014 p extensive coaching HFA effectiveness =    75%   All goals of chronic asthma control met including optimal (though certainly not nl)  function and elimination of symptoms with minimal need for rescue therapy.  Contingencies discussed in full including contacting this office immediately if not controlling the symptoms using the rule of two's.       Each maintenance medication was reviewed in detail including most importantly the difference between maintenance and as needed and under what circumstances the prns are to be used.  Please see instructions for details which were reviewed in writing and the patient given a copy.

## 2015-01-15 ENCOUNTER — Telehealth: Payer: Self-pay | Admitting: Internal Medicine

## 2015-01-15 MED ORDER — MOMETASONE FURO-FORMOTEROL FUM 200-5 MCG/ACT IN AERO: INHALATION_SPRAY | RESPIRATORY_TRACT | Status: DC

## 2015-01-15 NOTE — Telephone Encounter (Signed)
Rx was refilled  [name] for the pt to be made aware

## 2015-01-20 ENCOUNTER — Other Ambulatory Visit: Payer: Self-pay | Admitting: Obstetrics and Gynecology

## 2015-01-20 DIAGNOSIS — R928 Other abnormal and inconclusive findings on diagnostic imaging of breast: Secondary | ICD-10-CM

## 2015-01-21 ENCOUNTER — Ambulatory Visit
Admission: RE | Admit: 2015-01-21 | Discharge: 2015-01-21 | Disposition: A | Discharge status: Home / Self Care | Payer: Medicare Other | Source: Ambulatory Visit | Attending: Obstetrics and Gynecology | Admitting: Obstetrics and Gynecology

## 2015-01-21 DIAGNOSIS — R928 Other abnormal and inconclusive findings on diagnostic imaging of breast: Secondary | ICD-10-CM

## 2015-02-28 ENCOUNTER — Ambulatory Visit: Payer: Medicare Other | Admitting: Internal Medicine

## 2015-04-23 ENCOUNTER — Ambulatory Visit: Payer: Self-pay | Admitting: Internal Medicine

## 2015-05-30 ENCOUNTER — Ambulatory Visit: Payer: Self-pay | Admitting: Internal Medicine

## 2015-05-30 ENCOUNTER — Ambulatory Visit (INDEPENDENT_AMBULATORY_CARE_PROVIDER_SITE_OTHER): Payer: Medicare Other | Admitting: Internal Medicine

## 2015-05-30 DIAGNOSIS — J45901 Unspecified asthma with (acute) exacerbation: Secondary | ICD-10-CM | POA: Diagnosis not present

## 2015-05-30 LAB — PULMONARY FUNCTION TEST
DL/VA % PRED: 164 %
DL/VA: 6.72 ml/min/mmHg/L
DLCO unc % pred: 127 %
DLCO unc: 22.33 ml/min/mmHg
FEF 25-75 POST: 0.41 L/s
FEF 25-75 Pre: 0.37 L/sec
FEF2575-%Change-Post: 12 %
FEF2575-%Pred-Post: 16 %
FEF2575-%Pred-Pre: 14 %
FEV1-%Change-Post: 5 %
FEV1-%PRED-PRE: 35 %
FEV1-%Pred-Post: 38 %
FEV1-Post: 0.9 L
FEV1-Pre: 0.85 L
FEV1FVC-%Change-Post: 2 %
FEV1FVC-%Pred-Pre: 59 %
FEV6-%Change-Post: 4 %
FEV6-%PRED-PRE: 60 %
FEV6-%Pred-Post: 63 %
FEV6-PRE: 1.75 L
FEV6-Post: 1.83 L
FEV6FVC-%Change-Post: 1 %
FEV6FVC-%Pred-Post: 101 %
FEV6FVC-%Pred-Pre: 100 %
FVC-%Change-Post: 3 %
FVC-%PRED-PRE: 60 %
FVC-%Pred-Post: 62 %
FVC-POST: 1.86 L
FVC-PRE: 1.8 L
POST FEV6/FVC RATIO: 99 %
PRE FEV6/FVC RATIO: 97 %
Post FEV1/FVC ratio: 48 %
Pre FEV1/FVC ratio: 47 %

## 2015-05-30 NOTE — Progress Notes (Signed)
PFT performed today. 

## 2015-06-03 ENCOUNTER — Ambulatory Visit (INDEPENDENT_AMBULATORY_CARE_PROVIDER_SITE_OTHER): Payer: Medicare Other | Admitting: Internal Medicine

## 2015-06-03 ENCOUNTER — Ambulatory Visit (INDEPENDENT_AMBULATORY_CARE_PROVIDER_SITE_OTHER)
Admission: RE | Admit: 2015-06-03 | Discharge: 2015-06-03 | Disposition: A | Discharge status: Home / Self Care | Payer: Medicare Other | Source: Ambulatory Visit | Attending: Internal Medicine | Admitting: Internal Medicine

## 2015-06-03 ENCOUNTER — Encounter: Payer: Self-pay | Admitting: Internal Medicine

## 2015-06-03 DIAGNOSIS — J4550 Severe persistent asthma, uncomplicated: Secondary | ICD-10-CM | POA: Diagnosis not present

## 2015-06-03 MED ORDER — ALBUTEROL SULFATE HFA 108 (90 BASE) MCG/ACT IN AERS: 2.0000 | INHALATION_SPRAY | Freq: Four times a day (QID) | RESPIRATORY_TRACT | Status: DC | PRN

## 2015-06-03 NOTE — Progress Notes (Signed)
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  She weaned herself off of prednisone successfully   in 11/2007     History of Present Illness  03/22/2012 f/u ov/Kayla [name] no longer on prednisone at all for RA cc persistent chronic doe x walking dog x sev minutes, worse with hills, does fine at rest, using saba sev times per day with some improvement but not relief of sob despite advair 500 rec Dulera 200 Take 2 puffs first thing in am and then another 2 puffs about 12 hours later.  Only use your albuterol (maxair) as a rescue medication   Work on inhaler technique:   10/11/2014 f/u ov/Kayla [name] re: asthma/  dulera 200 2bid maint / prn Kayla [name] for RA per [facility] Chief Complaint  Patient presents with  . Follow-up    Pt states that her breathing "is crappy as it has always been". She is using rescue inhaler very rarely.   using recumbent bike x 30 min daily s stopping    Sleeping fine, cough not an issue No longer walking dog  rec Continue dulera 200 Take 2 puffs first thing in am and then another 2 puffs about 12 hours later.  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 Follow-up    CXR done today. Pt states her breathing is currently stable. No new co's today.   does ok on ex bike/ Not limited by breathing from desired activities  But by arthritis    No obvious day to day or daytime variabilty or assoc chronic cough or cp or chest tightness, subjective wheeze overt sinus or hb symptoms. No unusual exp hx or h/o  childhood pna/ asthma or knowledge of premature birth.  Sleeping ok without nocturnal  or early am exacerbation  of respiratory  c/o's or need for noct saba. Also denies any obvious fluctuation of symptoms with weather or environmental changes or other aggravating or alleviating factors except as outlined above   Current Medications, Allergies, Complete Past Medical History, Past Surgical History, Family History, and Social History were reviewed in [employer] record.  ROS  The following are not active complaints unless bolded sore throat, dysphagia, dental problems, itching, sneezing,  nasal congestion or excess/ purulent secretions, ear ache,   fever, chills, sweats, unintended wt loss, pleuritic or exertional cp, hemoptysis,  orthopnea pnd or leg swelling, presyncope, palpitations, heartburn, abdominal pain, anorexia, nausea, vomiting, diarrhea  or change in bowel or urinary habits, change in stools or urine, dysuria,hematuria,  rash, arthralgias, visual complaints, headache, numbness weakness or ataxia or problems with walking or coordination,  change in mood/affect or memory.            Past Medical History:  COUGH (ICD-786.2)  RHINITIS, CHRONIC (ICD-472.0)  ASTHMA (ICD-493.90)  ARTHRITIS, RHEUMATOID, SEVERE (ICD-714.0)............................................Marland KitchenSchriver @ [facility]  - Sept 17 2009 started daily prednisone > tapered off        Objective:   Physical Exam  10/11/2014          141 > 06/03/2015  132  Wt Readings from Last 3 Encounters:  03/22/12 131 lb 12.8 oz (59.784 kg)  08/19/09 125 lb 2.1 oz (56.759 kg)  11/13/08 122 lb 4 oz (55.452 kg)    GEN: A/Ox3; pleasant , NAD, well nourished   HEENT:  [place]/AT,  EACs-clear, TMs-wnl, NOSE-clear, THROAT-clear, no lesions, no postnasal drip or exudate noted.   NECK:  Supple w/ fair ROM; no JVD; normal carotid impulses w/o bruits; no thyromegaly or nodules palpated; no lymphadenopathy.  RESP  slt distant   BS  No wheezes/ rales/ or rhonchi.no accessory muscle use, no dullness to percussion  CARD:  RRR, no m/r/g  , no peripheral edema, pulses intact, no cyanosis or clubbing.  GI:   Soft & nt; nml bowel sounds; no organomegaly or masses detected.  Musco: Warm bil, severe ra changes bilaterally both hands   Neuro: alert, no focal deficits noted.    Skin: Warm, no lesions or rashes   CXR PA and Lateral:   06/03/2015 :     I personally reviewed images and agree with radiology impression as follows:   No acute cardiopulmonary process.      Assessment & Plan:   Outpatient Encounter Prescriptions as of 06/03/2015  Medication Sig  . abatacept (ORENCIA) 250 MG injection Inject into the vein every 6 (six) weeks.  . bimatoprost (LUMIGAN) 0.01 % SOLN Place 1 drop into the left eye at bedtime.  . brimonidine (ALPHAGAN P) 0.1 % SOLN Place 1 drop into the left eye 2 (two) times daily.  . Calcium Carb-Cholecalciferol (CALCIUM PLUS VITAMIN D3) 600-500 MG-UNIT CAPS Take 2 tablets by mouth daily.  . celecoxib (CELEBREX) 200 MG capsule Take 200 mg by mouth 2 (two) times daily.  Marland Kitchen HYDROcodone-acetaminophen (VICODIN) 5-500 MG per tablet Take 1 tablet by mouth every 6 (six) hours as needed.  . loteprednol (LOTEMAX) 0.5 % ophthalmic suspension Place 1 drop into the left eye 3 (three) times daily.  . mometasone-formoterol (DULERA) 200-5 MCG/ACT AERO Take 2 puffs first thing in am and then another 2 puffs about 12 hours later.  . venlafaxine (EFFEXOR) 37.5 MG tablet Take 37.5 mg by mouth daily.  . [DISCONTINUED] pirbuterol (MAXAIR AUTOHALER) 200 MCG/INH inhaler Inhale 2 puffs into the lungs 4 (four) times daily as needed.  Marland Kitchen albuterol (PROAIR HFA) 108 (90 BASE) MCG/ACT inhaler Inhale 2 puffs into the lungs every 6 (six) hours as needed for wheezing or shortness of breath. 2 puffs every 4 hours as needed only  if your can't catch your breath   No facility-administered encounter medications on file as of  06/03/2015.

## 2015-06-03 NOTE — Patient Instructions (Addendum)
Continue dulera 200 Take 2 puffs first thing in am and then another 2 puffs about 12 hours later.   Only use your albuterol as a rescue medication to be used if you can't catch your breath by resting or doing a relaxed purse lip breathing pattern.  - The less you use it, the better it will work when you need it. - Ok to use up to 2 puffs  every 4 hours if you must but call for immediate appointment if use goes up over your usual need - Don't leave home without it !!  (think of it like the spare tire for your car)   Follow up yearly - sooner if needed

## 2015-06-03 NOTE — Progress Notes (Signed)
Quick Note:  Will discuss at ov 06/03/15 ______

## 2015-06-03 NOTE — Progress Notes (Signed)
Quick Note:  LMTCB ______ 

## 2015-06-04 ENCOUNTER — Encounter: Payer: Self-pay | Admitting: Internal Medicine

## 2015-06-04 NOTE — Assessment & Plan Note (Signed)
PFTs 01/28/06          FEV1 44%  ratio 39% diffusing capacity  116% with 8% improvement after B2 - PFTs 06/25/11        FEV1 0.96 (40%)   Ratio 37  - PFT's  05/30/2015  FEV1 0.90 (38 % ) ratio 48  No % improvement from saba with DLCO  127 %    p dulera 200 that am   - 10/11/2014    75%  - 06/03/2015 p extensive coaching HFA effectiveness =  75%   So the overall pattern here as severe but relatively stable over the last 4 years off of systemic steroids. Whether we call this chronic asthma or rheumatoid bronchiolitis is really an academic issue as the treatment from my perspective is the same  I had an extended discussion with the patient reviewing all relevant studies completed to date and  lasting 15 to 20 minutes of a 25 minute visit    Each maintenance medication was reviewed in detail including most importantly the difference between maintenance and prns and under what circumstances the prns are to be triggered using an action plan format that is not reflected in the computer generated alphabetically organized AVS.    Please see instructions for details which were reviewed in writing and the patient given a copy highlighting the part that I personally wrote and discussed at today's ov.

## 2015-06-04 NOTE — Progress Notes (Signed)
Quick Note:  Spoke with pt and notified of results per Dr. Wert. Pt verbalized understanding and denied any questions.  ______ 

## 2015-06-05 HISTORY — PX: ESOPHAGOGASTRODUODENOSCOPY: SHX1529

## 2015-10-09 ENCOUNTER — Other Ambulatory Visit: Payer: Self-pay | Admitting: Internal Medicine

## 2015-10-31 ENCOUNTER — Other Ambulatory Visit: Payer: Self-pay | Admitting: Internal Medicine

## 2015-11-12 DIAGNOSIS — M069 Rheumatoid arthritis, unspecified: Secondary | ICD-10-CM | POA: Diagnosis not present

## 2015-11-12 DIAGNOSIS — M8580 Other specified disorders of bone density and structure, unspecified site: Secondary | ICD-10-CM | POA: Diagnosis not present

## 2015-11-12 DIAGNOSIS — F419 Anxiety disorder, unspecified: Secondary | ICD-10-CM | POA: Diagnosis not present

## 2015-11-12 DIAGNOSIS — Z0000 Encounter for general adult medical examination without abnormal findings: Secondary | ICD-10-CM | POA: Diagnosis not present

## 2015-11-12 DIAGNOSIS — E559 Vitamin D deficiency, unspecified: Secondary | ICD-10-CM | POA: Diagnosis not present

## 2015-11-12 DIAGNOSIS — Z1389 Encounter for screening for other disorder: Secondary | ICD-10-CM | POA: Diagnosis not present

## 2015-11-14 DIAGNOSIS — E559 Vitamin D deficiency, unspecified: Secondary | ICD-10-CM | POA: Diagnosis not present

## 2015-11-14 DIAGNOSIS — Z0000 Encounter for general adult medical examination without abnormal findings: Secondary | ICD-10-CM | POA: Diagnosis not present

## 2015-11-24 ENCOUNTER — Telehealth: Payer: Self-pay | Admitting: Internal Medicine

## 2015-11-24 MED ORDER — MOMETASONE FURO-FORMOTEROL FUM 200-5 MCG/ACT IN AERO: INHALATION_SPRAY | RESPIRATORY_TRACT | Status: DC

## 2015-11-24 NOTE — Telephone Encounter (Signed)
Called spoke with pt. She is requesting refill on her dulera sent into [facility]. I have done so. Nothing further needed

## 2016-01-07 ENCOUNTER — Encounter: Payer: Self-pay | Admitting: Acute Care

## 2016-01-07 ENCOUNTER — Ambulatory Visit (INDEPENDENT_AMBULATORY_CARE_PROVIDER_SITE_OTHER): Payer: Medicare Other | Admitting: Acute Care

## 2016-01-07 VITALS — BP 122/78 | HR 78 | Temp 98.3°F | Ht <= 58 in | Wt 142.8 lb

## 2016-01-07 DIAGNOSIS — R059 Cough, unspecified: Secondary | ICD-10-CM

## 2016-01-07 DIAGNOSIS — R05 Cough: Secondary | ICD-10-CM | POA: Diagnosis not present

## 2016-01-07 DIAGNOSIS — M0800 Unspecified juvenile rheumatoid arthritis of unspecified site: Secondary | ICD-10-CM | POA: Diagnosis not present

## 2016-01-07 DIAGNOSIS — Z79899 Other long term (current) drug therapy: Secondary | ICD-10-CM | POA: Diagnosis not present

## 2016-01-07 MED ORDER — CEFDINIR 300 MG PO CAPS: 300.0000 mg | ORAL_CAPSULE | Freq: Two times a day (BID) | ORAL | Status: DC

## 2016-01-07 MED ORDER — HYDROCODONE-HOMATROPINE 5-1.5 MG/5ML PO SYRP: 5.0000 mL | ORAL_SOLUTION | Freq: Four times a day (QID) | ORAL | Status: DC | PRN

## 2016-01-07 MED ORDER — LEVALBUTEROL HCL 0.63 MG/3ML IN NEBU
0.6300 mg | INHALATION_SOLUTION | Freq: Once | RESPIRATORY_TRACT | Status: AC
  Administered 2016-01-07: 0.63 mg via RESPIRATORY_TRACT

## 2016-01-07 NOTE — Assessment & Plan Note (Addendum)
Bronchitis Flare/ Immune compromised patient on Orencia for severe RA  Refuses Prednisone taper or Depo Medrol Injection Plan: Xopenex TX per neb now. Omnicef 300 mg twice daily x 7 days Mucinex DM Twice daily As needed Cough/congestion  Fluids and rest  Restart Nexium 40mg  daily  Hydromet 1-2 tsp every 4-6 hrs As needed Cough- may make you sleepy.  Please contact office for sooner follow up if symptoms do not improve or worsen or seek emergency care  follow up Dr. Melvyn Novas In 3 months

## 2016-01-07 NOTE — Patient Instructions (Addendum)
Nice to meet you today Nebulizer treatment now. Omnicef 300 mg twice daily x 7 days Mucinex DM Twice daily As needed Cough/congestion  Fluids and rest  Restart Nexium 40mg  daily  Hydromet 1-2 tsp every 4-6 hrs As needed Cough- may make you sleepy.  Follow up as needed for cough Please contact office for sooner follow up if symptoms do not improve or worsen or seek emergency care  follow up Dr. Melvyn Novas In 1 months

## 2016-01-07 NOTE — Progress Notes (Signed)
Subjective:    Patient ID: Kayla [name], female    DOB: 06/01/1966, 50 y.o.   MRN: [id number]  HPI   39 yowf never smoker with a history of chronic asthma and severe deforming rheumatoid arthritis . She  had been steroid-dependent since she was in her 29s with her last FEV1 documented at 1.05 or 44% predicted with a ratio 39% on 01/28/06 with 8% improvement in bronchodilator normal diffusing capacity with ddx = chronic asthma vs rheumatoid bronchiolitis.  She weaned herself off the  prednisone successfully x > one month in 2/09  She takes Orencia and therefore is  an immune compromised patient.  01/07/2016: Acute office visit Patient presents to the office today with post viral bronchitis. She states she had viral illness with fever approximately a week ago. She is recovering, but has what she calls her usual post viral bronchitis. She states she is no longer coughing up purulent sputum, but does have an increase in volume of sputum.. She denies fever, chest pain, hemoptysis, orthopnea. No recent airline or auto Mobile travel. No leg or calf pain. She refuses prednisone taper, despite inspiratory and expiratory wheezing. She refuses Depo-Medrol injection states it gives her panic attacks. Xopenex nebulizer treatment in the office for wheezing today.    Current outpatient prescriptions:  .  abatacept (ORENCIA) 250 MG injection, Inject into the vein once a week. , Disp: , Rfl:  .  albuterol (PROAIR HFA) 108 (90 BASE) MCG/ACT inhaler, Inhale 2 puffs into the lungs every 6 (six) hours as needed for wheezing or shortness of breath. 2 puffs every 4 hours as needed only  if your can't catch your breath, Disp: 1 Inhaler, Rfl: 11 .  bimatoprost (LUMIGAN) 0.01 % SOLN, Place 1 drop into the left eye at bedtime., Disp: , Rfl:  .  brimonidine (ALPHAGAN P) 0.1 % SOLN, Place 1 drop into the left eye 2 (two) times daily., Disp: , Rfl:  .  Calcium Carb-Cholecalciferol (CALCIUM PLUS VITAMIN D3) 600-500 MG-UNIT  CAPS, Take 2 tablets by mouth daily., Disp: , Rfl:  .  celecoxib (CELEBREX) 200 MG capsule, Take 200 mg by mouth daily. Take 1 tablets every other day, Disp: , Rfl:  .  HYDROcodone-acetaminophen (VICODIN) 5-500 MG per tablet, Take 1 tablet by mouth every 6 (six) hours as needed., Disp: , Rfl:  .  loteprednol (LOTEMAX) 0.5 % ophthalmic suspension, Place 1 drop into the left eye 2 (two) times daily. , Disp: , Rfl:  .  mometasone-formoterol (DULERA) 200-5 MCG/ACT AERO, TAKE 2 PUFFS BY MOUTH EVERY MORNING AND TAKE 2 PUFFS 12 HOURS LATER, Disp: 1 Inhaler, Rfl: 5 .  venlafaxine (EFFEXOR) 37.5 MG tablet, Take 37.5 mg by mouth daily., Disp: , Rfl:  .  cefdinir (OMNICEF) 300 MG capsule, Take 1 capsule (300 mg total) by mouth 2 (two) times daily., Disp: 14 capsule, Rfl: 0 .  HYDROcodone-homatropine (HYCODAN) 5-1.5 MG/5ML syrup, Take 5 mLs by mouth every 6 (six) hours as needed for cough., Disp: 240 mL, Rfl: 0   Past Medical History  Diagnosis Date  . Cough   . Chronic rhinitis   . Asthma   . Rheumatoid arthritis(714.0)    Allergies  Allergen Reactions  . Prednisone Other (See Comments)    Panic attack-severe-last for days  . Sulfonamide Derivatives     Review of Systems Constitutional:   No  weight loss, night sweats,  Fevers, chills, fatigue, or  lassitude.  HEENT:   No headaches,  Difficulty swallowing,  Tooth/dental problems, or  Sore throat,                No sneezing, itching, ear ache, nasal congestion, post nasal drip,   CV:  No chest pain,  Orthopnea, PND, swelling in lower extremities, anasarca, dizziness, palpitations, syncope.   GI  No heartburn, indigestion, abdominal pain, nausea, vomiting, diarrhea, change in bowel habits, loss of appetite, bloody stools.   Resp: No shortness of breath with exertion or at rest.  + excess mucus, + productive cough,  No non-productive cough,  No coughing up of blood.  No change in color of mucus.  + wheezing.  No chest wall deformity  Skin: no  rash or lesions.  GU: no dysuria, change in color of urine, no urgency or frequency.  No flank pain, no hematuria   MS:  Severe RA with deformed joints  No back pain.  Psych:  No change in mood or affect. No depression or anxiety.  No memory loss.        Objective:   Physical Exam  BP 122/78 mmHg  Pulse 78  Temp(Src) 98.3 F (36.8 C) (Oral)  Ht 4\' 10"  (1.473 m)  Wt 142 lb 12.8 oz (64.774 kg)  BMI 29.85 kg/m2  SpO2 95%  Physical Exam:  General- No distress,  A&Ox3 ENT: No sinus tenderness, TM clear, pale nasal mucosa, no oral exudate,no post nasal drip, no LAN Cardiac: S1, S2, regular rate and rhythm, no murmur Chest: Inspiratory and Expiratory Wheezing/ no rales/ dullness; no accessory muscle use, no nasal flaring, no sternal retractions Abd.: Soft Non-tender Ext: No clubbing cyanosis, edema, severe deformity 2/2/ RA Neuro:  normal strength Skin: No rashes, warm and dry Psych: normal mood and behavior    Magdalen Spatz, AGACNP-BC [place] Pager # [phone] 01/07/2016 Assessment & Plan:

## 2016-01-07 NOTE — Progress Notes (Signed)
Chart and office note reviewed in detail  > agree with a/p as outlined    

## 2016-01-12 ENCOUNTER — Ambulatory Visit (INDEPENDENT_AMBULATORY_CARE_PROVIDER_SITE_OTHER)
Admission: RE | Admit: 2016-01-12 | Discharge: 2016-01-12 | Disposition: A | Discharge status: Home / Self Care | Payer: Medicare Other | Source: Ambulatory Visit | Attending: Acute Care | Admitting: Acute Care

## 2016-01-12 ENCOUNTER — Encounter: Payer: Self-pay | Admitting: Acute Care

## 2016-01-12 ENCOUNTER — Ambulatory Visit (INDEPENDENT_AMBULATORY_CARE_PROVIDER_SITE_OTHER): Payer: Medicare Other | Admitting: Acute Care

## 2016-01-12 VITALS — BP 122/76 | HR 93 | Ht <= 58 in | Wt 142.0 lb

## 2016-01-12 DIAGNOSIS — J4550 Severe persistent asthma, uncomplicated: Secondary | ICD-10-CM | POA: Diagnosis not present

## 2016-01-12 DIAGNOSIS — R059 Cough, unspecified: Secondary | ICD-10-CM

## 2016-01-12 DIAGNOSIS — R05 Cough: Secondary | ICD-10-CM

## 2016-01-12 DIAGNOSIS — R0602 Shortness of breath: Secondary | ICD-10-CM | POA: Diagnosis not present

## 2016-01-12 MED ORDER — METHYLPREDNISOLONE ACETATE 80 MG/ML IJ SUSP
80.0000 mg | Freq: Once | INTRAMUSCULAR | Status: AC
  Administered 2016-01-12: 80 mg via INTRAMUSCULAR

## 2016-01-12 NOTE — Progress Notes (Signed)
Subjective:    Patient ID: Kayla [name], female    DOB: July 06, 1966, 50 y.o.   MRN: [id number]  HPI   66 yowf never smoker with a history of chronic asthma and severe deforming rheumatoid arthritis . She had been steroid-dependent since she was in her 36s with her last FEV1 documented at 1.05 or 44% predicted with a ratio 39% on 01/28/06 with 8% improvement in bronchodilator normal diffusing capacity with ddx = chronic asthma vs rheumatoid bronchiolitis.  She weaned herself off the prednisone successfully x > one month in 2/09  She takes Orencia and therefore is an immune compromised patient.  01/12/2016: CXR No acute cardiopulmonary disease  01/12/2016: Acute office visit: Patient returns to the office today with continued complaint of post viral bronchitis. She was seen in the office 01/07/2016 and was treated with Omnicef 300 mg twice daily for 7 days, Mucinex DM twice daily for cough and congestion, and Hydromet 1-2 teaspoons every 4-6 hours as needed for cough. At that time she refused prednisone taper or Depo-Medrol injection. She states she discontinued the [facility] antibiotic herself after 3 days, discontinued the Mucinex, discontinued the Hydromet, all after 3 days. She stated she wasn't feeling better, and that it was too much medicine. She returns today with continued cough and wheezing, with some exertional shortness of breath. Chest x-ray was clear today. She denies fever ,chest pain ,orthopnea, hemoptysis, or purulent sputum. She has agreed to the low dose 80 mg Depo-Medrol injection today.   Current outpatient prescriptions:  .  abatacept (ORENCIA) 250 MG injection, Inject into the vein once a week. , Disp: , Rfl:  .  albuterol (PROAIR HFA) 108 (90 BASE) MCG/ACT inhaler, Inhale 2 puffs into the lungs every 6 (six) hours as needed for wheezing or shortness of breath. 2 puffs every 4 hours as needed only  if your can't catch your breath, Disp: 1 Inhaler, Rfl: 11 .  bimatoprost  (LUMIGAN) 0.01 % SOLN, Place 1 drop into the left eye at bedtime., Disp: , Rfl:  .  brimonidine (ALPHAGAN P) 0.1 % SOLN, Place 1 drop into the left eye 2 (two) times daily., Disp: , Rfl:  .  Calcium Carb-Cholecalciferol (CALCIUM PLUS VITAMIN D3) 600-500 MG-UNIT CAPS, Take 2 tablets by mouth daily., Disp: , Rfl:  .  celecoxib (CELEBREX) 200 MG capsule, Take 200 mg by mouth daily. Take 1 tablets every other day, Disp: , Rfl:  .  HYDROcodone-acetaminophen (VICODIN) 5-500 MG per tablet, Take 1 tablet by mouth every 6 (six) hours as needed., Disp: , Rfl:  .  loteprednol (LOTEMAX) 0.5 % ophthalmic suspension, Place 1 drop into the left eye 2 (two) times daily. , Disp: , Rfl:  .  mometasone-formoterol (DULERA) 200-5 MCG/ACT AERO, TAKE 2 PUFFS BY MOUTH EVERY MORNING AND TAKE 2 PUFFS 12 HOURS LATER, Disp: 1 Inhaler, Rfl: 5 .  venlafaxine (EFFEXOR) 37.5 MG tablet, Take 37.5 mg by mouth daily., Disp: , Rfl:  .  cefdinir (OMNICEF) 300 MG capsule, Take 1 capsule (300 mg total) by mouth 2 (two) times daily. (Patient not taking: Reported on 01/12/2016), Disp: 14 capsule, Rfl: 0 .  HYDROcodone-homatropine (HYCODAN) 5-1.5 MG/5ML syrup, Take 5 mLs by mouth every 6 (six) hours as needed for cough. (Patient not taking: Reported on 01/12/2016), Disp: 240 mL, Rfl: 0   Past Medical History  Diagnosis Date  . Cough   . Chronic rhinitis   . Asthma   . Rheumatoid arthritis(714.0)     Allergies  Allergen Reactions  . Prednisone Other (See Comments)    Panic attack-severe-last for days  . Sulfonamide Derivatives    Review of Systems Constitutional:   No  weight loss, night sweats,  Fevers, chills, fatigue, or  lassitude.  HEENT:   No headaches,  Difficulty swallowing,  Tooth/dental problems, or  Sore throat,                No sneezing, itching, ear ache, nasal congestion, post nasal drip,   CV:  No chest pain,  Orthopnea, PND, swelling in lower extremities, anasarca, dizziness, palpitations, syncope.   GI  No  heartburn, indigestion, abdominal pain, nausea, vomiting, diarrhea, change in bowel habits, loss of appetite, bloody stools.   Resp: + shortness of breath with exertion not at rest.  No excess mucus, no productive cough,  + non-productive cough,  No coughing up of blood.  No change in color of mucus.  + wheezing.  No chest wall deformity  Skin: no rash or lesions.  GU: no dysuria, change in color of urine, no urgency or frequency.  No flank pain, no hematuria   MS:  No joint pain or swelling.  No decreased range of motion.  No back pain.  Psych:  No change in mood or affect. No depression or anxiety.  No memory loss.        Objective:   Physical Exam BP 122/76 mmHg  Pulse 93  Ht 4\' 10"  (1.473 m)  Wt 142 lb (64.411 kg)  BMI 29.69 kg/m2  SpO2 97% Physical Exam:  General- No distress,  A&Ox3,  ENT: No sinus tenderness, TM clear, pale nasal mucosa, no oral exudate,no post nasal drip, no LAN,  Cardiac: S1, S2, regular rate and rhythm, no murmur Chest: No wheeze/ rales/ dullness; no accessory muscle use, no nasal flaring, no sternal retractions,+ cough, Abd.: Soft Non-tender Ext: No clubbing cyanosis, edema Neuro:  normal strength Skin: No rashes, warm and dry Psych: normal mood and behavior  Magdalen Spatz, AGACNP-BC [place] Pager # [phone] 01/12/2016     Assessment & Plan:

## 2016-01-12 NOTE — Assessment & Plan Note (Addendum)
Patient returns with post viral cough. Stoped taking Omnicef 3 days after last visit on 01/07/2016 Stoped taking Mucinex 3 days after last visit on 01/07/2016 Stopped taking Hydromet cough syrup 3 days after last visit on 01/07/2016. Refused prednisone as option on 01/07/2016. Returns to the office today with same post viral cough. Denies fever or purulent secretions.  Plan: Had discussion with patient about not completing antibiotic course and increased risk of drug-resistant bacteria. Depo Medrol 80 mg. injection today CXR today reviewed by me.( no acute cardiopulmonary disease) Plain Mucinex twice daily Sips of water instead of throat clearing Sugar free candy to soothe your throat Avoid mint and menthol Continue your protonix Follow up as needed. Please contact office for sooner follow up if symptoms do not improve or worsen or seek emergency care

## 2016-01-12 NOTE — Patient Instructions (Addendum)
Depo Medrol injection today CXR today Plain Mucinex twice daily Sips of water instead of throat clearing Sugar free candy to soothe your throat Follow up as you feel you need to. Avoid mint and menthol Continue your protonix Please contact office for sooner follow up if symptoms do not improve or worsen or seek emergency care

## 2016-01-13 NOTE — Progress Notes (Signed)
Chart and office note reviewed in detail along with available xrays  > agree with a/p as outlined  

## 2016-01-14 DIAGNOSIS — M810 Age-related osteoporosis without current pathological fracture: Secondary | ICD-10-CM | POA: Diagnosis not present

## 2016-01-14 DIAGNOSIS — M859 Disorder of bone density and structure, unspecified: Secondary | ICD-10-CM | POA: Diagnosis not present

## 2016-01-20 ENCOUNTER — Telehealth: Payer: Self-pay | Admitting: Internal Medicine

## 2016-01-20 MED ORDER — PREDNISONE 10 MG PO TABS: ORAL_TABLET | ORAL | Status: DC

## 2016-01-20 NOTE — Telephone Encounter (Signed)
Pt c/o increased cough-dry. Pt states that she is tired of coughing and is tired. Pt is requesting a Prednisone Taper. Pt does not want to start with 40mg  - has a "anxiety" reaction at the higher doses. Pt requests to start with 20mg  at most and taper down. Pt has been seen by Eric Form twice this month, 01/07/16 given neb treatment in office, prescribed Omnicef 300mg  with Mucinex and Hydromet Prn. Pt returned and saw Judson Roch again on 01/12/16, given depo injection in office and advised ton continue Mucinex prn. Pt states that even after all that she is still coughing and is wanting Prednisone. Please advise Dr Melvyn Novas.

## 2016-01-20 NOTE — Telephone Encounter (Signed)
Pt returning call.Kayla [name] ° °

## 2016-01-20 NOTE — Telephone Encounter (Signed)
Spoke with the pt and notified of recs per MW  Rx was sent and nothing further needed

## 2016-01-20 NOTE — Telephone Encounter (Signed)
Prednisone 20 mg until better (but ov if not better p one week) then 10 mg daily x 5 days and stop

## 2016-01-20 NOTE — Telephone Encounter (Signed)
LMTCB X1 

## 2016-02-06 ENCOUNTER — Ambulatory Visit: Payer: Medicare Other | Admitting: Internal Medicine

## 2016-03-04 DIAGNOSIS — N760 Acute vaginitis: Secondary | ICD-10-CM | POA: Diagnosis not present

## 2016-03-12 DIAGNOSIS — H6122 Impacted cerumen, left ear: Secondary | ICD-10-CM | POA: Diagnosis not present

## 2016-04-26 DIAGNOSIS — J029 Acute pharyngitis, unspecified: Secondary | ICD-10-CM | POA: Diagnosis not present

## 2016-04-26 DIAGNOSIS — M069 Rheumatoid arthritis, unspecified: Secondary | ICD-10-CM | POA: Diagnosis not present

## 2016-04-26 DIAGNOSIS — J4530 Mild persistent asthma, uncomplicated: Secondary | ICD-10-CM | POA: Diagnosis not present

## 2016-06-02 ENCOUNTER — Ambulatory Visit (INDEPENDENT_AMBULATORY_CARE_PROVIDER_SITE_OTHER): Payer: Medicare Other | Admitting: Internal Medicine

## 2016-06-02 ENCOUNTER — Encounter: Payer: Self-pay | Admitting: Internal Medicine

## 2016-06-02 VITALS — BP 126/74 | HR 101 | Ht 59.0 in | Wt 149.0 lb

## 2016-06-02 DIAGNOSIS — L821 Other seborrheic keratosis: Secondary | ICD-10-CM | POA: Diagnosis not present

## 2016-06-02 DIAGNOSIS — Z23 Encounter for immunization: Secondary | ICD-10-CM | POA: Diagnosis not present

## 2016-06-02 DIAGNOSIS — D2271 Melanocytic nevi of right lower limb, including hip: Secondary | ICD-10-CM | POA: Diagnosis not present

## 2016-06-02 DIAGNOSIS — D225 Melanocytic nevi of trunk: Secondary | ICD-10-CM | POA: Diagnosis not present

## 2016-06-02 DIAGNOSIS — L814 Other melanin hyperpigmentation: Secondary | ICD-10-CM | POA: Diagnosis not present

## 2016-06-02 DIAGNOSIS — M8930 Hypertrophy of bone, unspecified site: Secondary | ICD-10-CM | POA: Diagnosis not present

## 2016-06-02 DIAGNOSIS — Z808 Family history of malignant neoplasm of other organs or systems: Secondary | ICD-10-CM | POA: Diagnosis not present

## 2016-06-02 DIAGNOSIS — D2272 Melanocytic nevi of left lower limb, including hip: Secondary | ICD-10-CM | POA: Diagnosis not present

## 2016-06-02 DIAGNOSIS — J4550 Severe persistent asthma, uncomplicated: Secondary | ICD-10-CM | POA: Diagnosis not present

## 2016-06-02 DIAGNOSIS — D2261 Melanocytic nevi of right upper limb, including shoulder: Secondary | ICD-10-CM | POA: Diagnosis not present

## 2016-06-02 DIAGNOSIS — Z86018 Personal history of other benign neoplasm: Secondary | ICD-10-CM | POA: Diagnosis not present

## 2016-06-02 NOTE — Patient Instructions (Addendum)
Weight control is simply a matter of calorie balance which needs to be tilted in your favor by eating less and exercising more.  To get the most out of exercise, you need to be continuously aware that you are short of breath, but never out of breath, for 30 minutes daily. As you improve, it will actually be easier for you to do the same amount of exercise  in  30 minutes so always push to the level where you are short of breath.  If this does not result in gradual weight reduction then I strongly recommend you see a nutritionist with a food diary x 2 weeks so that we can work out a negative calorie balance which is universally effective in steady weight loss programs.  Think of your calorie balance like you do your bank account where in this case you want the balance to go down so you must take in less calories than you burn up.  It's just that simple:  Hard to do, but easy to understand.  Good luck!   Add pepcid 20 mg at bedtime for now  GERD (REFLUX)  is an extremely common cause of respiratory symptoms just like yours , many times with no obvious heartburn at all.    It can be treated with medication, but also with lifestyle changes including elevation of the head of your bed (ideally with 6 inch  bed blocks),  Smoking cessation, avoidance of late meals, excessive alcohol, and avoid fatty foods, chocolate, peppermint, colas, red wine, and acidic juices such as orange juice.  NO MINT OR MENTHOL PRODUCTS SO NO COUGH DROPS   USE SUGARLESS CANDY INSTEAD (Jolley ranchers or Stover's or Life Savers) or even ice chips will also do - the key is to swallow to prevent all throat clearing. NO OIL BASED VITAMINS - use powdered substitutes.    Please schedule a follow up visit in 3 months but call sooner if needed with pfts

## 2016-06-02 NOTE — Assessment & Plan Note (Signed)
Body mass index is 30.09 kg/m.  No results found for: TSH   Contributing to gerd tendency/ doe/reviewed the need and the process to achieve and maintain neg calorie balance > defer f/u primary care including intermittently monitoring thyroid status

## 2016-06-02 NOTE — Assessment & Plan Note (Addendum)
PFTs 01/28/06          FEV1 44%  ratio 39% diffusing capacity  116% with 8% improvement after B2 - PFTs 06/25/11        FEV1 0.96 (40%)   Ratio 37  - PFT's  05/30/2015  FEV1 0.90 (38 % ) ratio 48  No % improvement from saba with DLCO  127 %    p dulera 200 that am   - 10/11/2014    75%  - 06/02/2016 p extensive coaching HFA effectiveness =  90% - rec add pepcid ac 20 mg hs to see if helps am "wheeze/sob       Symptoms are markedly disproportionate to objective findings and not clear this is a lung problem but pt does appear to have difficult airway management issues. DDX of  difficult airways management almost all start with A and  include Adherence, Ace Inhibitors, Acid Reflux, Active Sinus Disease, Alpha 1 Antitripsin deficiency, Anxiety masquerading as Airways dz,  ABPA,  Allergy(esp in young), Aspiration (esp in elderly), Adverse effects of meds,  Active smokers, A bunch of PE's (a small clot burden can't cause this syndrome unless there is already severe underlying pulm or vascular dz with poor reserve) plus two Bs  = Bronchiectasis and Beta blocker use..and one C= CHF  Adherence is always the initial "prime suspect" and is a multilayered concern that requires a "trust but verify" approach in every patient - starting with knowing how to use medications, especially inhalers, correctly, keeping up with refills and understanding the fundamental difference between maintenance and prns vs those medications only taken for a very short course and then stopped and not refilled.   ? Acid (or non-acid) GERD > always difficult to exclude as up to 75% of pts in some series report no assoc GI/ Heartburn symptoms> rec max (24h)  acid suppression and diet restrictions/ reviewed and instructions given in writing.   ? Anxiety related to wt gain > reviewed   ? Allergy/asthma component > should be adequately addressed on dulera 200 with wheeze on exam.  I had an extended discussion with the patient reviewing all  relevant studies completed to date and  lasting 15 to 20 minutes of a 25 minute visit    Each maintenance medication was reviewed in detail including most importantly the difference between maintenance and prns and under what circumstances the prns are to be triggered using an action plan format that is not reflected in the computer generated alphabetically organized AVS.    Please see instructions for details which were reviewed in writing and the patient given a copy highlighting the part that I personally wrote and discussed at today's ov.   Will return for full pfts in 3 months, no other change in rx in meantime

## 2016-06-02 NOTE — Progress Notes (Signed)
Subjective:    Patient ID: Kayla [name], female    DOB: [birth year]    MRN: [id number]   Brief patient profile:  1 yowf never smoker with a history of chronic asthma and severe deforming rheumatoid arthritis since which had been steroid-dependent since she was in her 35s with  chronic asthma vs rheumatoid bronchiolitis.  She weaned herself off of prednisone successfully   in 11/2007     History of Present Illness  03/22/2012 f/u ov/Icelyn Navarrete no longer on prednisone at all for RA cc persistent chronic doe x walking dog x sev minutes, worse with hills, does fine at rest, using saba sev times per day with some improvement but not relief of sob despite advair 500 rec Dulera 200 Take 2 puffs first thing in am and then another 2 puffs about 12 hours later.  Only use your albuterol (maxair) as a rescue medication   Work on inhaler technique:   10/11/2014 f/u ov/Tyianna Menefee re: asthma/  dulera 200 2bid maint / prn [place] for RA per [facility] Chief Complaint  Patient presents with  . Follow-up    Pt states that her breathing "is crappy as it has always been". She is using rescue inhaler very rarely.   using recumbent bike x 30 min daily s stopping    Sleeping fine, cough not an issue No longer walking dog  rec Continue dulera 200 Take 2 puffs first thing in am and then another 2 puffs about 12 hours later.  Try using maxair 5 min before exercise to see what difference it makes GERD diet     06/03/2015 f/u ov/Fortune Torosian re: chronic asthma vs RA bronchiolitis  maint rx with dulera 200 2bid/ not using saba at all  Chief Complaint  Patient presents with  . Follow-up    CXR done today. Pt states her breathing is currently stable. No new co's today.   does ok on ex bike/ Not limited by breathing from desired activities  But by arthritis  rec Continue dulera 200 Take 2 puffs first thing in am and then another 2 puffs about 12 hours later.  Only use your albuterol as a rescue medication   06/02/2016  f/u  ov/Benjamim Harnish re: RA / ? RA bronchiolitis on dulera 200 2bid / rare saba  Chief Complaint  Patient presents with  . Follow-up    Pt states she feels her baseline has changed since her last visit. She is unsure if her breathing is any worse "this may be the new normal".   main problem is first thing in am needs to grab dulera to catch her breath every day even though using it < 10h before that dose Doe = MMRC2 = can't walk a nl pace on a flat grade s sob but does fine slow and flat eg [employer] slowly  Works out 3 x weekly x 30 min but doesn't get sob         As long as takes dulera 200 >> No obvious day to day or daytime variabilty or assoc excess/ purulent sputum or mucus plugs   or cp or chest tightness, subjective wheeze overt sinus or hb symptoms. No unusual exp hx or h/o childhood pna/ asthma or knowledge of premature birth.  Sleeping ok without nocturnal    exacerbation  of respiratory  c/o's or need for noct saba. Also denies any obvious fluctuation of symptoms with weather or environmental changes or other aggravating or alleviating factors except as outlined above  Current Medications, Allergies, Complete Past Medical History, Past Surgical History, Family History, and Social History were reviewed in [employer] record.  ROS  The following are not active complaints unless bolded sore throat, dysphagia, dental problems, itching, sneezing,  nasal congestion or excess/ purulent secretions, ear ache,   fever, chills, sweats, unintended wt loss, pleuritic or exertional cp, hemoptysis,  orthopnea pnd or leg swelling, presyncope, palpitations, heartburn, abdominal pain, anorexia, nausea, vomiting, diarrhea  or change in bowel or urinary habits, change in stools or urine, dysuria,hematuria,  rash, arthralgias ok , visual complaints, headache, numbness weakness or ataxia or problems with walking or coordination,  change in mood/affect or memory.            Past Medical History:   COUGH (ICD-786.2)  RHINITIS, CHRONIC (ICD-472.0)  ASTHMA (ICD-493.90)  ARTHRITIS, RHEUMATOID, SEVERE (ICD-714.0)............................................Marland [facility]river [facility]  - Sept 17 2009 started daily prednisone > tapered off        Objective:   Physical Exam  amb obese wf / mildly hoarse/ vital signs reviewed -    10/11/2014          141 > 06/03/2015  132 >  06/02/2016   150     03/22/12 131 lb 12.8 oz (59.784 kg)  08/19/09 125 lb 2.1 oz (56.759 kg)  11/13/08 122 lb 4 oz (55.452 kg)    GEN: A/Ox3; pleasant , NAD, well nourished    HEENT:  [place]/AT,  EACs-clear, TMs-wnl, NOSE-clear, THROAT-clear, no lesions, no postnasal drip or exudate noted.   NECK:  Supple w/ fair ROM; no JVD; normal carotid impulses w/o bruits; no thyromegaly or nodules palpated; no lymphadenopathy.    RESP  slt distant  BS  No wheezes/ rales/ or rhonchi. no accessory muscle use, no dullness to percussion  CARD:  RRR, no m/r/g  , no peripheral edema, pulses intact, no cyanosis or clubbing.  GI:   Soft & nt; nml bowel sounds; no organomegaly or masses detected.   Musco: Warm bil, severe ra changes bilaterally both hands   Neuro: alert, no focal deficits noted.    Skin: Warm, no lesions or rashes         Assessment & Plan:

## 2016-07-09 DIAGNOSIS — M899 Disorder of bone, unspecified: Secondary | ICD-10-CM | POA: Diagnosis not present

## 2016-07-09 DIAGNOSIS — M898X8 Other specified disorders of bone, other site: Secondary | ICD-10-CM | POA: Insufficient documentation

## 2016-07-16 DIAGNOSIS — E221 Hyperprolactinemia: Secondary | ICD-10-CM | POA: Diagnosis not present

## 2016-07-16 DIAGNOSIS — E663 Overweight: Secondary | ICD-10-CM | POA: Diagnosis not present

## 2016-07-16 DIAGNOSIS — R232 Flushing: Secondary | ICD-10-CM | POA: Diagnosis not present

## 2016-07-16 DIAGNOSIS — Z6829 Body mass index (BMI) 29.0-29.9, adult: Secondary | ICD-10-CM | POA: Diagnosis not present

## 2016-07-19 DIAGNOSIS — Z8639 Personal history of other endocrine, nutritional and metabolic disease: Secondary | ICD-10-CM | POA: Diagnosis not present

## 2016-07-19 DIAGNOSIS — M069 Rheumatoid arthritis, unspecified: Secondary | ICD-10-CM | POA: Diagnosis not present

## 2016-07-19 DIAGNOSIS — E221 Hyperprolactinemia: Secondary | ICD-10-CM | POA: Diagnosis not present

## 2016-07-19 DIAGNOSIS — Z8349 Family history of other endocrine, nutritional and metabolic diseases: Secondary | ICD-10-CM | POA: Diagnosis not present

## 2016-07-28 DIAGNOSIS — Z01419 Encounter for gynecological examination (general) (routine) without abnormal findings: Secondary | ICD-10-CM | POA: Diagnosis not present

## 2016-07-28 DIAGNOSIS — Z124 Encounter for screening for malignant neoplasm of cervix: Secondary | ICD-10-CM | POA: Diagnosis not present

## 2016-09-03 DIAGNOSIS — M899 Disorder of bone, unspecified: Secondary | ICD-10-CM | POA: Diagnosis not present

## 2016-10-04 DIAGNOSIS — L729 Follicular cyst of the skin and subcutaneous tissue, unspecified: Secondary | ICD-10-CM

## 2016-10-04 HISTORY — DX: Follicular cyst of the skin and subcutaneous tissue, unspecified: L72.9

## 2016-10-07 ENCOUNTER — Encounter (HOSPITAL_BASED_OUTPATIENT_CLINIC_OR_DEPARTMENT_OTHER): Payer: Self-pay | Admitting: *Deleted

## 2016-10-08 ENCOUNTER — Encounter (HOSPITAL_BASED_OUTPATIENT_CLINIC_OR_DEPARTMENT_OTHER): Payer: Self-pay | Admitting: *Deleted

## 2016-10-08 ENCOUNTER — Ambulatory Visit: Payer: Self-pay | Admitting: Plastic Surgery

## 2016-10-08 DIAGNOSIS — L729 Follicular cyst of the skin and subcutaneous tissue, unspecified: Secondary | ICD-10-CM

## 2016-10-08 DIAGNOSIS — Z1231 Encounter for screening mammogram for malignant neoplasm of breast: Secondary | ICD-10-CM | POA: Diagnosis not present

## 2016-10-08 NOTE — H&P (Signed)
Kayla [name] is an 51 y.o. female.   Chief Complaint: scalp cyst HPI: The patient is a 51 y.o. yrs old wf here for treatment of a mass on her skull.  It is at the posterior auricular area on the right side.  It is slightly larger than last visit at 3 cm, raised and firm.  It does not look infected.  There is no redness and no other lesions noted.  It has been present for ~ 6 months. She denies any history of trauma.  She has multiple medical problems with severe RA.  She was a little nervous about the surgery but now wants to move forward.  It is most likely a sebaceous cyst.   Past Medical History:  Diagnosis Date  . Asthma   . Chronic rhinitis   . Cough   . History of kidney stones   . Rheumatoid arthritis(714.0)     Past Surgical History:  Procedure Laterality Date  . CESAREAN SECTION  01/01/2004  . CYSTOSCOPY WITH RETROGRADE PYELOGRAM, URETEROSCOPY AND STENT PLACEMENT Right 08/28/2003  . ESOPHAGEAL MANOMETRY  04/28/2004  . ESOPHAGOGASTRODUODENOSCOPY  06/05/2015  . HIP CLOSED REDUCTION Right 04/17/2008   dislocated total hip arthroplasty  . REVISION TOTAL KNEE ARTHROPLASTY Right 08/15/2013  . TOTAL HIP ARTHROPLASTY Right   . TOTAL KNEE ARTHROPLASTY Right     No family history on file. Social History:  reports that she has never smoked. She does not have any smokeless tobacco history on file. Her alcohol and drug histories are not on file.  Allergies:  Allergies  Allergen Reactions  . Prednisone Other (See Comments)    Panic attack-severe-last for days  . Sulfonamide Derivatives      (Not in a hospital admission)  No results found for this or any previous visit (from the past 48 hour(s)). No results found.  Review of Systems  Constitutional: Negative.   HENT: Negative.   Eyes: Negative.   Respiratory: Negative.   Cardiovascular: Negative.   Gastrointestinal: Negative.   Genitourinary: Negative.   Musculoskeletal: Negative.   Skin: Negative.   Neurological:  Negative.   Psychiatric/Behavioral: Negative.     There were no vitals taken for this visit. Physical Exam  Constitutional: She is oriented to person, place, and time. She appears well-developed and well-nourished.  HENT:  Head: Normocephalic and atraumatic.  Eyes: EOM are normal. Pupils are equal, round, and reactive to light.  Cardiovascular: Normal rate.   Respiratory: Effort normal. No respiratory distress.  GI: Soft. She exhibits no distension. There is no tenderness.  Musculoskeletal: Normal range of motion.  Neurological: She is alert and oriented to person, place, and time.  Skin: Skin is warm. No erythema.  Psychiatric: She has a normal mood and affect. Her behavior is normal. Judgment and thought content normal.     Assessment/Plan Excision of scalp cyst  [name], DO 10/08/2016, 7:30 AM

## 2016-10-13 ENCOUNTER — Encounter (HOSPITAL_BASED_OUTPATIENT_CLINIC_OR_DEPARTMENT_OTHER)
Admission: RE | Disposition: A | Discharge status: Home / Self Care | Payer: Self-pay | Source: Ambulatory Visit | Attending: Plastic Surgery

## 2016-10-13 ENCOUNTER — Ambulatory Visit (HOSPITAL_BASED_OUTPATIENT_CLINIC_OR_DEPARTMENT_OTHER)
Admission: RE | Admit: 2016-10-13 | Discharge: 2016-10-13 | Disposition: A | Discharge status: Home / Self Care | Payer: Medicare Other | Source: Ambulatory Visit | Attending: Plastic Surgery | Admitting: Plastic Surgery

## 2016-10-13 ENCOUNTER — Ambulatory Visit (HOSPITAL_BASED_OUTPATIENT_CLINIC_OR_DEPARTMENT_OTHER): Payer: Medicare Other | Admitting: Certified Registered"

## 2016-10-13 ENCOUNTER — Encounter (HOSPITAL_BASED_OUTPATIENT_CLINIC_OR_DEPARTMENT_OTHER): Payer: Self-pay | Admitting: Certified Registered"

## 2016-10-13 DIAGNOSIS — Z96651 Presence of right artificial knee joint: Secondary | ICD-10-CM | POA: Insufficient documentation

## 2016-10-13 DIAGNOSIS — R220 Localized swelling, mass and lump, head: Secondary | ICD-10-CM | POA: Diagnosis not present

## 2016-10-13 DIAGNOSIS — D164 Benign neoplasm of bones of skull and face: Secondary | ICD-10-CM | POA: Insufficient documentation

## 2016-10-13 DIAGNOSIS — M069 Rheumatoid arthritis, unspecified: Secondary | ICD-10-CM | POA: Diagnosis not present

## 2016-10-13 DIAGNOSIS — J45909 Unspecified asthma, uncomplicated: Secondary | ICD-10-CM | POA: Insufficient documentation

## 2016-10-13 DIAGNOSIS — M899 Disorder of bone, unspecified: Secondary | ICD-10-CM | POA: Diagnosis not present

## 2016-10-13 DIAGNOSIS — Z96641 Presence of right artificial hip joint: Secondary | ICD-10-CM | POA: Diagnosis not present

## 2016-10-13 DIAGNOSIS — L729 Follicular cyst of the skin and subcutaneous tissue, unspecified: Secondary | ICD-10-CM

## 2016-10-13 DIAGNOSIS — M898X8 Other specified disorders of bone, other site: Secondary | ICD-10-CM | POA: Diagnosis not present

## 2016-10-13 DIAGNOSIS — J310 Chronic rhinitis: Secondary | ICD-10-CM | POA: Diagnosis not present

## 2016-10-13 DIAGNOSIS — K219 Gastro-esophageal reflux disease without esophagitis: Secondary | ICD-10-CM | POA: Diagnosis not present

## 2016-10-13 HISTORY — DX: Stiffness of unspecified joint, not elsewhere classified: M25.60

## 2016-10-13 HISTORY — DX: Follicular cyst of the skin and subcutaneous tissue, unspecified: L72.9

## 2016-10-13 HISTORY — DX: Unspecified glaucoma: H40.9

## 2016-10-13 HISTORY — DX: Personal history of urinary calculi: Z87.442

## 2016-10-13 HISTORY — PX: MASS EXCISION: SHX2000

## 2016-10-13 HISTORY — DX: Gastro-esophageal reflux disease without esophagitis: K21.9

## 2016-10-13 HISTORY — DX: Dental restoration status: Z98.811

## 2016-10-13 SURGERY — EXCISION MASS
Anesthesia: Monitor Anesthesia Care | Site: Head | Laterality: Right

## 2016-10-13 MED ORDER — FENTANYL CITRATE (PF) 100 MCG/2ML IJ SOLN
INTRAMUSCULAR | Status: AC
  Filled 2016-10-13: qty 2

## 2016-10-13 MED ORDER — ARTIFICIAL TEARS OP OINT
TOPICAL_OINTMENT | OPHTHALMIC | Status: AC
  Filled 2016-10-13: qty 3.5

## 2016-10-13 MED ORDER — CEFAZOLIN SODIUM-DEXTROSE 2-4 GM/100ML-% IV SOLN: 2.0000 g | INTRAVENOUS | Status: DC

## 2016-10-13 MED ORDER — ONDANSETRON HCL 4 MG/2ML IJ SOLN
INTRAMUSCULAR | Status: AC
  Filled 2016-10-13: qty 2

## 2016-10-13 MED ORDER — SCOPOLAMINE 1 MG/3DAYS TD PT72: 1.0000 | MEDICATED_PATCH | Freq: Once | TRANSDERMAL | Status: DC | PRN

## 2016-10-13 MED ORDER — ONDANSETRON HCL 4 MG/2ML IJ SOLN
INTRAMUSCULAR | Status: DC | PRN
  Administered 2016-10-13: 4 mg via INTRAVENOUS

## 2016-10-13 MED ORDER — CEFAZOLIN SODIUM-DEXTROSE 2-3 GM-% IV SOLR
INTRAVENOUS | Status: DC | PRN
  Administered 2016-10-13: 2 g via INTRAVENOUS

## 2016-10-13 MED ORDER — CEFAZOLIN SODIUM-DEXTROSE 2-4 GM/100ML-% IV SOLN
INTRAVENOUS | Status: AC
  Filled 2016-10-13: qty 100

## 2016-10-13 MED ORDER — MIDAZOLAM HCL 2 MG/2ML IJ SOLN
1.0000 mg | INTRAMUSCULAR | Status: DC | PRN
  Administered 2016-10-13 (×2): 1 mg via INTRAVENOUS

## 2016-10-13 MED ORDER — LIDOCAINE 2% (20 MG/ML) 5 ML SYRINGE
INTRAMUSCULAR | Status: AC
  Filled 2016-10-13: qty 5

## 2016-10-13 MED ORDER — LIDOCAINE-EPINEPHRINE 2 %-1:100000 IJ SOLN
INTRAMUSCULAR | Status: AC
  Filled 2016-10-13: qty 6.8

## 2016-10-13 MED ORDER — PROPOFOL 10 MG/ML IV BOLUS
INTRAVENOUS | Status: DC | PRN
  Administered 2016-10-13 (×3): 20 mg via INTRAVENOUS

## 2016-10-13 MED ORDER — LIDOCAINE HCL (PF) 1 % IJ SOLN
INTRAMUSCULAR | Status: AC
  Filled 2016-10-13: qty 30

## 2016-10-13 MED ORDER — FENTANYL CITRATE (PF) 100 MCG/2ML IJ SOLN
50.0000 ug | INTRAMUSCULAR | Status: DC | PRN
  Administered 2016-10-13 (×2): 50 ug via INTRAVENOUS

## 2016-10-13 MED ORDER — BUPIVACAINE HCL (PF) 0.25 % IJ SOLN
INTRAMUSCULAR | Status: AC
  Filled 2016-10-13: qty 30

## 2016-10-13 MED ORDER — LIDOCAINE-EPINEPHRINE 2 %-1:100000 IJ SOLN
INTRAMUSCULAR | Status: DC | PRN
  Administered 2016-10-13 (×2): 1.7 mL

## 2016-10-13 MED ORDER — FENTANYL CITRATE (PF) 100 MCG/2ML IJ SOLN: 25.0000 ug | INTRAMUSCULAR | Status: DC | PRN

## 2016-10-13 MED ORDER — MIDAZOLAM HCL 2 MG/2ML IJ SOLN
INTRAMUSCULAR | Status: AC
  Filled 2016-10-13: qty 2

## 2016-10-13 MED ORDER — LACTATED RINGERS IV SOLN
INTRAVENOUS | Status: DC
  Administered 2016-10-13: 12:00:00 via INTRAVENOUS

## 2016-10-13 SURGICAL SUPPLY — 65 items
ADH SKN CLS APL DERMABOND .7 (GAUZE/BANDAGES/DRESSINGS) ×1
APL SKNCLS STERI-STRIP NONHPOA (GAUZE/BANDAGES/DRESSINGS)
BENZOIN TINCTURE PRP APPL 2/3 (GAUZE/BANDAGES/DRESSINGS) IMPLANT
BLADE CLIPPER SURG (BLADE) IMPLANT
BLADE SURG 15 STRL LF DISP TIS (BLADE) ×1 IMPLANT
BLADE SURG 15 STRL SS (BLADE) ×1
BNDG CONFORM 2 STRL LF (GAUZE/BANDAGES/DRESSINGS) IMPLANT
BNDG ELASTIC 2X5.8 VLCR STR LF (GAUZE/BANDAGES/DRESSINGS) IMPLANT
CANISTER SUCT 1200ML W/VALVE (MISCELLANEOUS) ×2 IMPLANT
CHLORAPREP W/TINT 26ML (MISCELLANEOUS) IMPLANT
CLEANER CAUTERY TIP 5X5 PAD (MISCELLANEOUS) IMPLANT
CORDS BIPOLAR (ELECTRODE) IMPLANT
COVER BACK TABLE 60X90IN (DRAPES) ×2 IMPLANT
COVER MAYO STAND STRL (DRAPES) ×2 IMPLANT
DECANTER SPIKE VIAL GLASS SM (MISCELLANEOUS) IMPLANT
DERMABOND ADVANCED (GAUZE/BANDAGES/DRESSINGS) ×1
DERMABOND ADVANCED .7 DNX12 (GAUZE/BANDAGES/DRESSINGS) ×1 IMPLANT
DRAPE LAPAROTOMY 100X72 PEDS (DRAPES) IMPLANT
DRAPE U-SHAPE 76X120 STRL (DRAPES) ×2 IMPLANT
DRSG TEGADERM 2-3/8X2-3/4 SM (GAUZE/BANDAGES/DRESSINGS) IMPLANT
DRSG TEGADERM 4X4.75 (GAUZE/BANDAGES/DRESSINGS) IMPLANT
ELECT COATED BLADE 2.86 ST (ELECTRODE) ×2 IMPLANT
ELECT NEEDLE BLADE 2-5/6 (NEEDLE) IMPLANT
ELECT REM PT RETURN 9FT ADLT (ELECTROSURGICAL) ×2
ELECT REM PT RETURN 9FT PED (ELECTROSURGICAL)
ELECTRODE REM PT RETRN 9FT PED (ELECTROSURGICAL) IMPLANT
ELECTRODE REM PT RTRN 9FT ADLT (ELECTROSURGICAL) ×1 IMPLANT
GLOVE BIO SURGEON STRL SZ 6.5 (GLOVE) ×6 IMPLANT
GLOVE BIOGEL PI IND STRL 7.0 (GLOVE) ×1 IMPLANT
GLOVE BIOGEL PI INDICATOR 7.0 (GLOVE) ×1
GOWN STRL REUS W/ TWL LRG LVL3 (GOWN DISPOSABLE) ×3 IMPLANT
GOWN STRL REUS W/TWL LRG LVL3 (GOWN DISPOSABLE) ×6
NEEDLE HYPO 22GX1.5 SAFETY (NEEDLE) ×2 IMPLANT
NEEDLE HYPO 30GX1 BEV (NEEDLE) IMPLANT
NEEDLE PRECISIONGLIDE 27X1.5 (NEEDLE) ×2 IMPLANT
NS IRRIG 1000ML POUR BTL (IV SOLUTION) ×2 IMPLANT
PACK BASIN DAY SURGERY FS (CUSTOM PROCEDURE TRAY) ×2 IMPLANT
PAD CLEANER CAUTERY TIP 5X5 (MISCELLANEOUS)
PENCIL BUTTON HOLSTER BLD 10FT (ELECTRODE) ×2 IMPLANT
RUBBERBAND STERILE (MISCELLANEOUS) IMPLANT
SHEET MEDIUM DRAPE 40X70 STRL (DRAPES) IMPLANT
SLEEVE SCD COMPRESS KNEE MED (MISCELLANEOUS) IMPLANT
SPONGE GAUZE 2X2 8PLY STRL LF (GAUZE/BANDAGES/DRESSINGS) IMPLANT
SPONGE GAUZE 4X4 12PLY STER LF (GAUZE/BANDAGES/DRESSINGS) ×2 IMPLANT
STRIP CLOSURE SKIN 1/2X4 (GAUZE/BANDAGES/DRESSINGS) IMPLANT
SUCTION FRAZIER HANDLE 10FR (MISCELLANEOUS) ×1
SUCTION TUBE FRAZIER 10FR DISP (MISCELLANEOUS) ×1 IMPLANT
SUT MNCRL 6-0 UNDY P1 1X18 (SUTURE) IMPLANT
SUT MNCRL AB 3-0 PS2 18 (SUTURE) IMPLANT
SUT MNCRL AB 4-0 PS2 18 (SUTURE) ×2 IMPLANT
SUT MON AB 5-0 P3 18 (SUTURE) IMPLANT
SUT MON AB 5-0 PS2 18 (SUTURE) ×2 IMPLANT
SUT MONOCRYL 6-0 P1 1X18 (SUTURE)
SUT PROLENE 5 0 P 3 (SUTURE) IMPLANT
SUT PROLENE 5 0 PS 2 (SUTURE) IMPLANT
SUT PROLENE 6 0 P 1 18 (SUTURE) IMPLANT
SUT VIC AB 5-0 P-3 18X BRD (SUTURE) IMPLANT
SUT VIC AB 5-0 P3 18 (SUTURE)
SUT VIC AB 5-0 PS2 18 (SUTURE) IMPLANT
SUT VICRYL 4-0 PS2 18IN ABS (SUTURE) IMPLANT
SYR BULB 3OZ (MISCELLANEOUS) ×2 IMPLANT
SYR CONTROL 10ML LL (SYRINGE) ×2 IMPLANT
TOWEL OR 17X24 6PK STRL BLUE (TOWEL DISPOSABLE) ×2 IMPLANT
TRAY DSU PREP LF (CUSTOM PROCEDURE TRAY) ×2 IMPLANT
TUBE CONNECTING 20X1/4 (TUBING) ×2 IMPLANT

## 2016-10-13 NOTE — Discharge Instructions (Signed)

## 2016-10-13 NOTE — Transfer of Care (Signed)
Immediate Anesthesia Transfer of Care Note  Patient: Kayla [name]  Procedure(s) Performed: Procedure(s): EXCISION RIGHT SCALP BONE MASS (Right)  Patient Location: PACU  Anesthesia Type:MAC  Level of Consciousness: awake, alert  and oriented  Airway & Oxygen Therapy: Patient Spontanous Breathing and Patient connected to nasal cannula oxygen  Post-op Assessment: Report given to RN and Post -op Vital signs reviewed and stable  Post vital signs: Reviewed and stable  Last Vitals:  Vitals:   10/13/16 1136 10/13/16 1324  BP: 126/87 116/73  Pulse: 94 81  Resp: 18   Temp: 37.1 C     Last Pain:  Vitals:   10/13/16 1136  TempSrc: Oral         Complications: No apparent anesthesia complications

## 2016-10-13 NOTE — Anesthesia Postprocedure Evaluation (Signed)
Anesthesia Post Note  Patient: Kayla [name]  Procedure(s) Performed: Procedure(s) (LRB): EXCISION RIGHT SCALP BONE MASS (Right)  Patient location during evaluation: PACU Anesthesia Type: MAC Level of consciousness: awake and alert Pain management: pain level controlled Vital Signs Assessment: post-procedure vital signs reviewed and stable Respiratory status: spontaneous breathing, nonlabored ventilation, respiratory function stable and patient connected to nasal cannula oxygen Cardiovascular status: stable and blood pressure returned to baseline Anesthetic complications: no       Last Vitals:  Vitals:   10/13/16 1338 10/13/16 1400  BP:  108/68  Pulse: 76 78  Resp: 12 18  Temp:  36.7 C    Last Pain:  Vitals:   10/13/16 1400  TempSrc: Oral  PainSc: 0-No pain                 Tiajuana Amass

## 2016-10-13 NOTE — Interval H&P Note (Signed)
History and Physical Interval Note:  10/13/2016 12:08 PM  Kayla [name]  has presented today for surgery, with the diagnosis of RIGHT SCALP CYST  The various methods of treatment have been discussed with the patient and family. After consideration of risks, benefits and other options for treatment, the patient has consented to  Procedure(s): EXCISION RIGHT SCALP CYST (Right) as a surgical intervention .  The patient's history has been reviewed, patient examined, no change in status, stable for surgery.  I have reviewed the patient's chart and labs.  Questions were answered to the patient's satisfaction.     [name]

## 2016-10-13 NOTE — Op Note (Signed)
DATE OF OPERATION: 10/13/2016  LOCATION: [facility]  PREOPERATIVE DIAGNOSIS: Right posterior auricular bone mass  POSTOPERATIVE DIAGNOSIS: Same  PROCEDURE: Excision of right posterior auricular bone mass 3 x 4 cm  SURGEON: Claire Sanger Dillingham, DO  ASSISTANT: Shawn Rayburn, PA  EBL: 50 cc  CONDITION: Stable  COMPLICATIONS: None  INDICATION: The patient, Kayla [name], is a 51 y.o. female born on [birth year], is here for treatment of a growing mass on the right posterior auricular area.   PROCEDURE DETAILS:  The patient was seen prior to surgery and marked.  The IV antibiotics were given. The patient was taken to the operating room and given an anesthetic. A standard time out was performed and all information was confirmed by those in the room. SCDs were placed.   The area was prepped with betadine.  Local was injected around the lesion.  The #15 blade was used to make an incision over the lesion.  The bovie was used to dissect down to the lesion which was found to be bone.  The chisel and malot were used to remove the 3 x 4 cm area of bone.  We did not try to get flush with the surrounding bone as care was taken not to enter the mastoid air space.  The skin was closed with 4-0 and 5-0 Monocryl.  The patient was allowed to wake up and taken to recovery room in stable condition at the end of the case. The family was notified at the end of the case.  The specimen was sent to pathology.

## 2016-10-13 NOTE — H&P (View-Only) (Signed)
Kayla [name] is an 51 y.o. female.   Chief Complaint: scalp cyst HPI: The patient is a 51 y.o. yrs old wf here for treatment of a mass on her skull.  It is at the posterior auricular area on the right side.  It is slightly larger than last visit at 3 cm, raised and firm.  It does not look infected.  There is no redness and no other lesions noted.  It has been present for ~ 6 months. She denies any history of trauma.  She has multiple medical problems with severe RA.  She was a little nervous about the surgery but now wants to move forward.  It is most likely a sebaceous cyst.   Past Medical History:  Diagnosis Date  . Asthma   . Chronic rhinitis   . Cough   . History of kidney stones   . Rheumatoid arthritis(714.0)     Past Surgical History:  Procedure Laterality Date  . CESAREAN SECTION  01/01/2004  . CYSTOSCOPY WITH RETROGRADE PYELOGRAM, URETEROSCOPY AND STENT PLACEMENT Right 08/28/2003  . ESOPHAGEAL MANOMETRY  04/28/2004  . ESOPHAGOGASTRODUODENOSCOPY  06/05/2015  . HIP CLOSED REDUCTION Right 04/17/2008   dislocated total hip arthroplasty  . REVISION TOTAL KNEE ARTHROPLASTY Right 08/15/2013  . TOTAL HIP ARTHROPLASTY Right   . TOTAL KNEE ARTHROPLASTY Right     No family history on file. Social History:  reports that she has never smoked. She does not have any smokeless tobacco history on file. Her alcohol and drug histories are not on file.  Allergies:  Allergies  Allergen Reactions  . Prednisone Other (See Comments)    Panic attack-severe-last for days  . Sulfonamide Derivatives      (Not in a hospital admission)  No results found for this or any previous visit (from the past 48 hour(s)). No results found.  Review of Systems  Constitutional: Negative.   HENT: Negative.   Eyes: Negative.   Respiratory: Negative.   Cardiovascular: Negative.   Gastrointestinal: Negative.   Genitourinary: Negative.   Musculoskeletal: Negative.   Skin: Negative.   Neurological:  Negative.   Psychiatric/Behavioral: Negative.     There were no vitals taken for this visit. Physical Exam  Constitutional: She is oriented to person, place, and time. She appears well-developed and well-nourished.  HENT:  Head: Normocephalic and atraumatic.  Eyes: EOM are normal. Pupils are equal, round, and reactive to light.  Cardiovascular: Normal rate.   Respiratory: Effort normal. No respiratory distress.  GI: Soft. She exhibits no distension. There is no tenderness.  Musculoskeletal: Normal range of motion.  Neurological: She is alert and oriented to person, place, and time.  Skin: Skin is warm. No erythema.  Psychiatric: She has a normal mood and affect. Her behavior is normal. Judgment and thought content normal.     Assessment/Plan Excision of scalp cyst  [name], DO 10/08/2016, 7:30 AM

## 2016-10-13 NOTE — Anesthesia Preprocedure Evaluation (Addendum)
Anesthesia Evaluation  Patient identified by MRN, date of birth, ID band Patient awake    Reviewed: Allergy & Precautions, NPO status , Patient's Chart, lab work & pertinent test results  Airway Mallampati: IV  TM Distance: >3 FB Neck ROM: Full  Mouth opening: Limited Mouth Opening  Dental   Pulmonary asthma ,    breath sounds clear to auscultation       Cardiovascular negative cardio ROS   Rhythm:Regular Rate:Normal     Neuro/Psych negative neurological ROS     GI/Hepatic Neg liver ROS, GERD  Medicated and Controlled,  Endo/Other  negative endocrine ROS  Renal/GU negative Renal ROS     Musculoskeletal  (+) Arthritis , Rheumatoid disorders,    Abdominal   Peds  Hematology negative hematology ROS (+)   Anesthesia Other Findings   Reproductive/Obstetrics                            Anesthesia Physical Anesthesia Plan  ASA: II  Anesthesia Plan: MAC   Post-op Pain Management:    Induction: Intravenous  Airway Management Planned: Natural Airway and Simple Face Mask  Additional Equipment:   Intra-op Plan:   Post-operative Plan:   Informed Consent: I have reviewed the patients History and Physical, chart, labs and discussed the procedure including the risks, benefits and alternatives for the proposed anesthesia with the patient or authorized representative who has indicated his/her understanding and acceptance.   Dental advisory given  Plan Discussed with: CRNA  Anesthesia Plan Comments:        Anesthesia Quick Evaluation

## 2016-10-14 ENCOUNTER — Encounter (HOSPITAL_BASED_OUTPATIENT_CLINIC_OR_DEPARTMENT_OTHER): Payer: Self-pay | Admitting: Plastic Surgery

## 2017-01-05 DIAGNOSIS — M069 Rheumatoid arthritis, unspecified: Secondary | ICD-10-CM | POA: Diagnosis not present

## 2017-01-05 DIAGNOSIS — F411 Generalized anxiety disorder: Secondary | ICD-10-CM | POA: Diagnosis not present

## 2017-01-05 DIAGNOSIS — Z0000 Encounter for general adult medical examination without abnormal findings: Secondary | ICD-10-CM | POA: Diagnosis not present

## 2017-01-05 DIAGNOSIS — K2270 Barrett's esophagus without dysplasia: Secondary | ICD-10-CM | POA: Diagnosis not present

## 2017-01-13 ENCOUNTER — Other Ambulatory Visit: Payer: Self-pay | Admitting: Gastroenterology

## 2017-01-26 DIAGNOSIS — H4042X3 Glaucoma secondary to eye inflammation, left eye, severe stage: Secondary | ICD-10-CM | POA: Diagnosis not present

## 2017-01-26 DIAGNOSIS — H40051 Ocular hypertension, right eye: Secondary | ICD-10-CM | POA: Diagnosis not present

## 2017-01-26 DIAGNOSIS — H1849 Other corneal degeneration: Secondary | ICD-10-CM | POA: Diagnosis not present

## 2017-01-26 DIAGNOSIS — H04123 Dry eye syndrome of bilateral lacrimal glands: Secondary | ICD-10-CM | POA: Diagnosis not present

## 2017-02-23 DIAGNOSIS — H4042X3 Glaucoma secondary to eye inflammation, left eye, severe stage: Secondary | ICD-10-CM | POA: Diagnosis not present

## 2017-03-03 ENCOUNTER — Other Ambulatory Visit: Payer: Self-pay | Admitting: Internal Medicine

## 2017-03-14 ENCOUNTER — Ambulatory Visit (HOSPITAL_COMMUNITY): Admit: 2017-03-14 | Payer: Medicare Other | Admitting: Gastroenterology

## 2017-03-14 ENCOUNTER — Encounter (HOSPITAL_COMMUNITY): Payer: Self-pay

## 2017-03-14 SURGERY — COLONOSCOPY WITH PROPOFOL
Anesthesia: Monitor Anesthesia Care

## 2017-04-11 DIAGNOSIS — H4042X3 Glaucoma secondary to eye inflammation, left eye, severe stage: Secondary | ICD-10-CM | POA: Diagnosis not present

## 2017-04-11 DIAGNOSIS — H40011 Open angle with borderline findings, low risk, right eye: Secondary | ICD-10-CM | POA: Diagnosis not present

## 2017-05-20 DIAGNOSIS — Z96641 Presence of right artificial hip joint: Secondary | ICD-10-CM | POA: Diagnosis not present

## 2017-06-08 DIAGNOSIS — H4042X3 Glaucoma secondary to eye inflammation, left eye, severe stage: Secondary | ICD-10-CM | POA: Diagnosis not present

## 2017-06-14 DIAGNOSIS — R3129 Other microscopic hematuria: Secondary | ICD-10-CM | POA: Insufficient documentation

## 2017-06-14 DIAGNOSIS — E221 Hyperprolactinemia: Secondary | ICD-10-CM | POA: Insufficient documentation

## 2017-06-14 DIAGNOSIS — Z79899 Other long term (current) drug therapy: Secondary | ICD-10-CM | POA: Diagnosis not present

## 2017-06-14 DIAGNOSIS — N959 Unspecified menopausal and perimenopausal disorder: Secondary | ICD-10-CM | POA: Insufficient documentation

## 2017-06-14 DIAGNOSIS — M0800 Unspecified juvenile rheumatoid arthritis of unspecified site: Secondary | ICD-10-CM | POA: Diagnosis not present

## 2017-06-14 DIAGNOSIS — Z23 Encounter for immunization: Secondary | ICD-10-CM | POA: Diagnosis not present

## 2017-06-25 ENCOUNTER — Other Ambulatory Visit: Payer: Self-pay | Admitting: Internal Medicine

## 2017-06-28 ENCOUNTER — Telehealth: Payer: Self-pay | Admitting: Internal Medicine

## 2017-06-28 ENCOUNTER — Other Ambulatory Visit: Payer: Self-pay | Admitting: Internal Medicine

## 2017-06-28 MED ORDER — MOMETASONE FURO-FORMOTEROL FUM 200-5 MCG/ACT IN AERO: INHALATION_SPRAY | RESPIRATORY_TRACT | 0 refills | Status: DC

## 2017-06-28 NOTE — Telephone Encounter (Signed)
Ok to refill x one as it's just been a little over a year and make appt prior to a month out

## 2017-06-28 NOTE — Telephone Encounter (Signed)
Spoke with pt, advised she has not been seen in over than a year and she proceeded to argue with me about it not being a year. I advised her that I would have to ask Dr. Melvyn Novas if we can refill the [facility] and she stated she needs an inhaler and she has been stable and doesnt understand because she came in recently (No record). She has no inhaler and needs it to breathe. I asked about making a follow up appt,  she stated she was driving and was unable to do that. Please advise. Dr. Melvyn Novas.

## 2017-06-28 NOTE — Telephone Encounter (Signed)
Pt is aware and her aware of MW's recommendations.  I have offered to schedule pt for ROV- pt stated she was currently driving and will call back to schedule.  Rx for [facility] 1 month has been sent to preferred pharmacy. Nothing further needed.

## 2017-07-12 ENCOUNTER — Other Ambulatory Visit: Payer: Self-pay | Admitting: Internal Medicine

## 2017-07-12 DIAGNOSIS — R0600 Dyspnea, unspecified: Secondary | ICD-10-CM

## 2017-07-13 ENCOUNTER — Ambulatory Visit (INDEPENDENT_AMBULATORY_CARE_PROVIDER_SITE_OTHER): Payer: Medicare Other | Admitting: Internal Medicine

## 2017-07-13 VITALS — BP 122/76 | HR 88 | Ht 59.0 in | Wt 147.0 lb

## 2017-07-13 DIAGNOSIS — J4550 Severe persistent asthma, uncomplicated: Secondary | ICD-10-CM

## 2017-07-13 DIAGNOSIS — R0600 Dyspnea, unspecified: Secondary | ICD-10-CM | POA: Diagnosis not present

## 2017-07-13 LAB — PULMONARY FUNCTION TEST
DL/VA % PRED: 148 %
DL/VA: 6.06 ml/min/mmHg/L
DLCO COR % PRED: 113 %
DLCO UNC % PRED: 114 %
DLCO cor: 19.96 ml/min/mmHg
DLCO unc: 20.14 ml/min/mmHg
FEF 25-75 PRE: 0.31 L/s
FEF 25-75 Post: 0.39 L/sec
FEF2575-%CHANGE-POST: 24 %
FEF2575-%PRED-POST: 16 %
FEF2575-%Pred-Pre: 12 %
FEV1-%CHANGE-POST: 9 %
FEV1-%Pred-Post: 36 %
FEV1-%Pred-Pre: 33 %
FEV1-PRE: 0.78 L
FEV1-Post: 0.85 L
FEV1FVC-%Change-Post: 3 %
FEV1FVC-%Pred-Pre: 53 %
FEV6-%CHANGE-POST: 8 %
FEV6-%Pred-Post: 65 %
FEV6-%Pred-Pre: 59 %
FEV6-PRE: 1.7 L
FEV6-Post: 1.85 L
FEV6FVC-%Change-Post: 2 %
FEV6FVC-%PRED-PRE: 96 %
FEV6FVC-%Pred-Post: 99 %
FVC-%Change-Post: 5 %
FVC-%Pred-Post: 65 %
FVC-%Pred-Pre: 62 %
FVC-Post: 1.92 L
FVC-Pre: 1.81 L
POST FEV1/FVC RATIO: 44 %
PRE FEV6/FVC RATIO: 94 %
Post FEV6/FVC ratio: 96 %
Pre FEV1/FVC ratio: 43 %
RV % PRED: 175 %
RV: 2.75 L
TLC % pred: 109 %
TLC: 4.71 L

## 2017-07-13 MED ORDER — MOMETASONE FURO-FORMOTEROL FUM 200-5 MCG/ACT IN AERO: INHALATION_SPRAY | RESPIRATORY_TRACT | 11 refills | Status: DC

## 2017-07-13 NOTE — Progress Notes (Signed)
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  She weaned herself off of prednisone successfully   in 11/2007     History of Present Illness  03/22/2012 f/u ov/Kayla [name] no longer on prednisone at all for RA cc persistent chronic doe x walking dog x sev minutes, worse with hills, does fine at rest, using saba sev times per day with some improvement but not relief of sob despite advair 500 rec Dulera 200 Take 2 puffs first thing in am and then another 2 puffs about 12 hours later.  Only use your albuterol (maxair) as a rescue medication   Work on inhaler technique:   10/11/2014 f/u ov/Kayla [name] re: asthma/  dulera 200 2bid maint / prn Kayla [name] for RA per Kayla [name] Chief Complaint  Patient presents with  . Follow-up    Pt states that her breathing "is crappy as it has always been". She is using rescue inhaler very rarely.   using recumbent bike x 30 min daily s stopping    Sleeping fine, cough not an issue No longer walking dog  rec Continue dulera 200 Take 2 puffs first thing in am and then another 2 puffs about 12 hours later.  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 Follow-up    CXR done today. Pt states her breathing is currently stable. No new co's today.   does ok on ex bike/ Not limited by breathing from desired activities  But by arthritis  rec Continue dulera 200 Take 2 puffs first thing in am and then another 2 puffs about 12 hours later.  Only use your albuterol as a rescue medication   06/02/2016  f/u  ov/Kayla [name] re: RA / ? RA bronchiolitis on dulera 200 2bid / rare saba  Chief Complaint  Patient presents with  . Follow-up    Pt states she feels her baseline has changed since her last visit. She is unsure if her breathing is any worse "this may be the new normal".   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 RA bronchiolitis  On dulera 200 Take 2 puffs first thing in am and then another 2 puffs about 12 hours later.  Chief Complaint  Patient presents with  . 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 No unusual exp hx or h/o childhood pna/ asthma or knowledge of premature birth.  Sleeping ok flat without nocturnal  or early am exacerbation  of respiratory  c/o's or need for noct saba. Also denies any obvious fluctuation of symptoms with weather or environmental changes or other aggravating or alleviating factors except as outlined above   Current Allergies, Complete Past Medical History, Past Surgical History, Family History, and Social History were reviewed in [employer] record.  ROS  The following are not active complaints  unless bolded Hoarseness, sore throat, dysphagia, dental problems, itching, sneezing,  nasal congestion or discharge of excess mucus or purulent secretions, ear ache,   fever, chills, sweats, unintended wt loss or wt gain, classically pleuritic or exertional cp,  orthopnea pnd or leg swelling, presyncope, palpitations, abdominal pain, anorexia, nausea, vomiting, diarrhea  or change in bowel habits or change in bladder habits, change in stools or change in urine, dysuria, hematuria,  rash, arthralgias, visual complaints, headache, numbness, weakness or ataxia or problems with walking or coordination,  change in mood/affect or memory.        Current Meds  Medication Sig  . abatacept (ORENCIA) 250 MG injection Inject into the vein once a week.   . brimonidine (ALPHAGAN P) 0.1 % SOLN Place 1 drop into the left eye 2 (two) times daily.  . celecoxib (CELEBREX) 200 MG capsule Take 200 mg by mouth daily. Take 1 tablet every other day  . esomeprazole (NEXIUM) 40 MG capsule Take 40 mg by mouth 2 (two) times daily before a meal.  . loteprednol (LOTEMAX) 0.5 % ophthalmic suspension Place 1 drop into the left eye 2 (two) times daily.   . mometasone-formoterol (DULERA) 200-5 MCG/ACT AERO TAKE 2 PUFFS BY MOUTH EVERY MORNING AND TAKE 2 PUFFS 12 HOURS LATER  . venlafaxine (EFFEXOR) 37.5 MG tablet Take 37.5 mg by mouth daily.  . [DISCONTINUED] mometasone-formoterol (DULERA) 200-5 MCG/ACT AERO TAKE 2 PUFFS BY MOUTH EVERY MORNING AND TAKE 2 PUFFS 12 HOURS LATER     .              Past Medical History:  COUGH (ICD-786.2)  RHINITIS, CHRONIC (ICD-472.0)  ASTHMA (ICD-493.90)  ARTHRITIS, RHEUMATOID, SEVERE (ICD-714.0)............................................Marland KitchenSchriver @ [facility]  - Sept 17 2009 started daily prednisone > tapered off        Objective:   Physical Exam  amb obese wf / min hoarse/ vital signs reviewed -   - Note on arrival 02 sats  98% on RA    10/11/2014          141 > 06/03/2015  132 >   06/02/2016   150  > 07/13/2017   147     03/22/12 131 lb 12.8 oz (59.784 kg)  08/19/09 125 lb 2.1 oz (56.759 kg)  11/13/08 122 lb 4 oz (55.452 kg)    GEN: A/Ox3; pleasant , NAD, well nourished    HEENT:  [place]/AT,  EACs-clear, TMs-wnl, NOSE-clear, THROAT-clear, no lesions, no postnasal drip or exudate noted.   NECK:  Supple w/ fair ROM; no JVD; normal carotid impulses w/o  bruits; no thyromegaly or nodules palpated; no lymphadenopathy.    RESP  Distant bs bilaterally but   No wheezes/ rales/ or rhonchi. no accessory muscle use, no dullness to percussion  CARD:  RRR, no m/r/g  , no peripheral edema, pulses intact, no cyanosis or clubbing.  GI:   Soft & nt; nml bowel sounds; no organomegaly or masses detected.   Musco: Warm bil, severe ra changes bilaterally both hands   Neuro: alert, no focal deficits noted.    Skin: Warm, no lesions or rashes         Assessment & Plan:

## 2017-07-13 NOTE — Patient Instructions (Addendum)
In event of a flare of coughing >  nexium 40 mg Take 30- 60 min before your first and last meals of the day but ok to leave off if not coughing    Continue dulera 200 Take 2 puffs first thing in am and then another 2 puffs about 12 hours later.    Please schedule a follow up visit in 12  months but call sooner if needed with pfts on return   with all medications /inhalers/ solutions in hand so we can verify exactly what you are taking. This includes all medications from all doctors and over the counters

## 2017-07-13 NOTE — Progress Notes (Signed)
PFT done today. 

## 2017-07-14 ENCOUNTER — Encounter: Payer: Self-pay | Admitting: Internal Medicine

## 2017-07-14 NOTE — Assessment & Plan Note (Signed)
PFTs 01/28/06          FEV1 44%  ratio 39% diffusing capacity  116% with 8% improvement after B2 - PFTs 06/25/11        FEV1 0.96 (40%)   Ratio 37  - PFT's  05/30/2015  FEV1 0.90 (38 % ) ratio 48  No % improvement from saba with DLCO  127 %    p dulera 200 that am    - 06/02/2016 p extensive coaching HFA effectiveness =  90% - rec add pepcid ac 20 mg hs to see if helps am "wheeze/sob      - PFT's  07/13/2017  FEV1 0.85 (36 % ) ratio 44   p 9 % improvement from saba p dulera 200 prior to study with DLCO  114/113c % corrects to 148  % for alv volume    - 07/13/2017  After extensive coaching HFA effectiveness =    90%    Concerned about decline in fev1 and could consider adding qvar if starts having more limiting symptoms or flares but pt content with present rx / control and not enthusiastic about any changes   I had an extended discussion with the patient reviewing all relevant studies completed to date and  lasting 15 to 20 minutes of a 25 minute visit    Each maintenance medication was reviewed in detail including most importantly the difference between maintenance and prns and under what circumstances the prns are to be triggered using an action plan format that is not reflected in the computer generated alphabetically organized AVS.    Please see AVS for specific instructions unique to this visit that I personally wrote and verbalized to the the pt in detail and then reviewed with pt  by my nurse highlighting any  changes in therapy recommended at today's visit to their plan of care.

## 2017-10-12 DIAGNOSIS — H4042X3 Glaucoma secondary to eye inflammation, left eye, severe stage: Secondary | ICD-10-CM | POA: Diagnosis not present

## 2017-10-12 DIAGNOSIS — H2703 Aphakia, bilateral: Secondary | ICD-10-CM | POA: Diagnosis not present

## 2017-10-19 DIAGNOSIS — Z01419 Encounter for gynecological examination (general) (routine) without abnormal findings: Secondary | ICD-10-CM | POA: Diagnosis not present

## 2017-10-19 DIAGNOSIS — Z1231 Encounter for screening mammogram for malignant neoplasm of breast: Secondary | ICD-10-CM | POA: Diagnosis not present

## 2017-10-19 DIAGNOSIS — Z6831 Body mass index (BMI) 31.0-31.9, adult: Secondary | ICD-10-CM | POA: Diagnosis not present

## 2017-10-19 DIAGNOSIS — N951 Menopausal and female climacteric states: Secondary | ICD-10-CM | POA: Diagnosis not present

## 2017-10-19 DIAGNOSIS — Z124 Encounter for screening for malignant neoplasm of cervix: Secondary | ICD-10-CM | POA: Diagnosis not present

## 2017-11-14 DIAGNOSIS — H4042X3 Glaucoma secondary to eye inflammation, left eye, severe stage: Secondary | ICD-10-CM | POA: Diagnosis not present

## 2017-11-14 DIAGNOSIS — H40019 Open angle with borderline findings, low risk, unspecified eye: Secondary | ICD-10-CM | POA: Diagnosis not present

## 2017-11-28 DIAGNOSIS — H40011 Open angle with borderline findings, low risk, right eye: Secondary | ICD-10-CM | POA: Diagnosis not present

## 2017-11-28 DIAGNOSIS — H4042X3 Glaucoma secondary to eye inflammation, left eye, severe stage: Secondary | ICD-10-CM | POA: Diagnosis not present

## 2017-12-05 DIAGNOSIS — H40019 Open angle with borderline findings, low risk, unspecified eye: Secondary | ICD-10-CM | POA: Diagnosis not present

## 2017-12-05 DIAGNOSIS — H4042X3 Glaucoma secondary to eye inflammation, left eye, severe stage: Secondary | ICD-10-CM | POA: Diagnosis not present

## 2018-01-06 DIAGNOSIS — J4530 Mild persistent asthma, uncomplicated: Secondary | ICD-10-CM | POA: Diagnosis not present

## 2018-01-06 DIAGNOSIS — M069 Rheumatoid arthritis, unspecified: Secondary | ICD-10-CM | POA: Diagnosis not present

## 2018-01-06 DIAGNOSIS — E669 Obesity, unspecified: Secondary | ICD-10-CM | POA: Diagnosis not present

## 2018-01-06 DIAGNOSIS — F419 Anxiety disorder, unspecified: Secondary | ICD-10-CM | POA: Diagnosis not present

## 2018-01-25 DIAGNOSIS — H40019 Open angle with borderline findings, low risk, unspecified eye: Secondary | ICD-10-CM | POA: Diagnosis not present

## 2018-01-25 DIAGNOSIS — H4042X3 Glaucoma secondary to eye inflammation, left eye, severe stage: Secondary | ICD-10-CM | POA: Diagnosis not present

## 2018-03-01 DIAGNOSIS — H2703 Aphakia, bilateral: Secondary | ICD-10-CM | POA: Diagnosis not present

## 2018-03-01 DIAGNOSIS — H4042X3 Glaucoma secondary to eye inflammation, left eye, severe stage: Secondary | ICD-10-CM | POA: Diagnosis not present

## 2018-03-16 DIAGNOSIS — R42 Dizziness and giddiness: Secondary | ICD-10-CM | POA: Diagnosis not present

## 2018-03-31 DIAGNOSIS — D126 Benign neoplasm of colon, unspecified: Secondary | ICD-10-CM | POA: Diagnosis not present

## 2018-03-31 DIAGNOSIS — Z1211 Encounter for screening for malignant neoplasm of colon: Secondary | ICD-10-CM | POA: Diagnosis not present

## 2018-03-31 DIAGNOSIS — K5730 Diverticulosis of large intestine without perforation or abscess without bleeding: Secondary | ICD-10-CM | POA: Diagnosis not present

## 2018-03-31 LAB — HM COLONOSCOPY

## 2018-04-03 DIAGNOSIS — D126 Benign neoplasm of colon, unspecified: Secondary | ICD-10-CM | POA: Diagnosis not present

## 2018-04-03 DIAGNOSIS — Z1211 Encounter for screening for malignant neoplasm of colon: Secondary | ICD-10-CM | POA: Diagnosis not present

## 2018-05-01 ENCOUNTER — Encounter: Payer: Self-pay | Admitting: Primary Care

## 2018-05-01 ENCOUNTER — Telehealth: Payer: Self-pay | Admitting: Primary Care

## 2018-05-01 ENCOUNTER — Ambulatory Visit: Payer: Medicare Other | Admitting: Primary Care

## 2018-05-01 VITALS — BP 118/82 | HR 76 | Wt 149.0 lb

## 2018-05-01 DIAGNOSIS — J4550 Severe persistent asthma, uncomplicated: Secondary | ICD-10-CM | POA: Diagnosis not present

## 2018-05-01 LAB — NITRIC OXIDE: Nitric Oxide: 11

## 2018-05-01 MED ORDER — BECLOMETHASONE DIPROP HFA 40 MCG/ACT IN AERB: 1.0000 | INHALATION_SPRAY | Freq: Two times a day (BID) | RESPIRATORY_TRACT | 5 refills | Status: DC

## 2018-05-01 MED ORDER — FIRST-DUKES MOUTHWASH MT SUSP: OROMUCOSAL | 0 refills | Status: DC

## 2018-05-01 NOTE — Progress Notes (Signed)
@Patient  ID: Kayla [name], female    DOB: 1966-07-14, 52 y.o.   MRN: [id number]  Chief Complaint  Patient presents with  . Acute Visit    Reports increased DOE for the past few weeks. Denies chest tighntess, wheezing or coughing at this time.    Referring provider: Lavone Orn, MD  HPI: 52 year old female, never smoked.  History severe chronic asthma, rheumatoid arthritis (weaned off prednisone in 2009).  Patient of Dr. Melvyn Novas, last seen in October 2018.  Continues on Dulera 200, 2 puffs twice daily. On orencia for RA. Does not require SABA.  Sleeping okay, no nocturnal symptoms.   Tests: - PFTs 01/28/06- FEV1 44%  ratio 39% diffusing capacity  116% with 8% improvement after B2 - PFTs 06/25/11 - FEV1 0.96 (40%) Ratio 37  - PFT's  05/30/2015 - FEV1 0.90 (38 %) ratio 48  No % improvement from saba with DLCO  127 %  (p dulera 200 that am) - PFT's  07/13/2017  FEV1 0.85 (36 % ) ratio 44   p 9 % improvement from saba p dulera 200 prior to study with DLCO  114/113c % corrects to 148  % for alv volume      05/01/2018  Complains of some shortness of breath while walking in park with friends. She is aware that her breathing will never be normal but she is frustrated with her lack of stamina. States that she "generally feels like shit" this summer. This time of year is particularly harder. RA has been acting up. Denies flares of symptoms, fever, chills or cough.   Allergies  Allergen Reactions  . Prednisone Other (See Comments)    SEVERE PANIC ATTACK  . Sulfa Antibiotics Rash    Immunization History  Administered Date(s) Administered  . Influenza Split 08/04/2014  . Influenza Whole 07/05/2011  . Influenza,inj,Quad PF,6+ Mos 06/02/2016  . Influenza-Unspecified 09/06/2011, 07/04/2014, 08/19/2015, 07/04/2016  . Pneumococcal Polysaccharide-23 10/05/2007    Past Medical History:  Diagnosis Date  . Asthma    daily inhaler  . Dental crowns present   . GERD (gastroesophageal reflux disease)    . Glaucoma   . History of kidney stones   . Limited joint range of motion    cervical spine and jaw - due to RA  . Rheumatoid arthritis(714.0)   . Scalp cyst 10/2016    Tobacco History: Social History   Tobacco Use  Smoking Status Never Smoker  Smokeless Tobacco Never Used   Counseling given: Not Answered   Outpatient Medications Prior to Visit  Medication Sig Dispense Refill  . abatacept (ORENCIA) 250 MG injection Inject into the vein once a week.     . brimonidine (ALPHAGAN P) 0.1 % SOLN Place 1 drop into the left eye 2 (two) times daily.    . celecoxib (CELEBREX) 200 MG capsule Take 200 mg by mouth daily. Take 1 tablet every other day    . loteprednol (LOTEMAX) 0.5 % ophthalmic suspension Place 1 drop into the left eye 2 (two) times daily.     . mometasone-formoterol (DULERA) 200-5 MCG/ACT AERO TAKE 2 PUFFS BY MOUTH EVERY MORNING AND TAKE 2 PUFFS 12 HOURS LATER 1 Inhaler 11  . venlafaxine (EFFEXOR) 37.5 MG tablet Take 37.5 mg by mouth daily.    Marland Kitchen esomeprazole (NEXIUM) 40 MG capsule Take 40 mg by mouth 2 (two) times daily before a meal.     No facility-administered medications prior to visit.     Review of Systems  Review of  Systems  Constitutional: Positive for fatigue.  HENT: Negative.   Respiratory: Positive for shortness of breath. Negative for cough, choking, chest tightness, wheezing and stridor.        Sob with activity   Cardiovascular: Negative.   Skin: Negative.     Physical Exam  BP 118/82 (BP Location: Left Arm, Cuff Size: Normal)   Pulse 76   Wt 149 lb (67.6 kg)   SpO2 96%   BMI 30.09 kg/m  Physical Exam  Constitutional: She is oriented to person, place, and time. She appears well-developed and well-nourished.  Small frame, adult female. No acute distress.   HENT:  Head: Normocephalic and atraumatic.  Eyes: Pupils are equal, round, and reactive to light. EOM are normal.  Neck: Normal range of motion. Neck supple.  Cardiovascular: Normal rate,  regular rhythm and normal heart sounds.  No murmur heard. Pulmonary/Chest: Effort normal and breath sounds normal. No respiratory distress. She has no wheezes.  LSC, dim. NO resp distress or use of accessory muscles.   Abdominal: Soft. Bowel sounds are normal. There is no tenderness.  Musculoskeletal:  RA deformity in bilateral hands   Neurological: She is alert and oriented to person, place, and time.  Skin: Skin is warm and dry. No rash noted. No erythema.  Psychiatric: She has a normal mood and affect. Her behavior is normal. Judgment normal.     Lab Results:  CBC    Component Value Date/Time   HGB 13.6 04/17/2008 1546   HCT 40.0 04/17/2008 1546    BMET    Component Value Date/Time   NA 142 04/17/2008 1546   K 3.9 04/17/2008 1546   CL 107 04/17/2008 1546   GLUCOSE 88 04/17/2008 1546   BUN 22 04/17/2008 1546   CREATININE 0.8 04/17/2008 1546    BNP No results found for: BNP  ProBNP No results found for: PROBNP  Imaging: No results found.   Assessment & Plan:   Severe persistent asthma in adult without complication - Worsening dyspnea on long flat walks and lack of stamina - Decline in FEV1 on most recent PFTs - Discussed with Dr. Melvyn Novas, could consider adding qvar if starts having more limiting symptoms or flares  - Plan add Qvar 24mcq 1 puff BID (if ineffective could increase to 2 puffs BID) - FU in 3 months  - Refer to pulmonary rehab      Martyn Ehrich, NP 05/01/2018

## 2018-05-01 NOTE — Telephone Encounter (Signed)
Called patient unable to reach left message to call back. Per Eustaquio Maize she is sending in prescription for the magic mouth wash.

## 2018-05-01 NOTE — Patient Instructions (Addendum)
Pulmonary rehab referral Qvar 1 puff BID  FU in 3 months with Dr. Melvyn Novas or Eustaquio Maize NP

## 2018-05-01 NOTE — Assessment & Plan Note (Addendum)
-   Worsening dyspnea on long flat walks and lack of stamina - Decline in FEV1 on most recent PFTs - Discussed with Dr. Melvyn Novas, could consider adding qvar if starts having more limiting symptoms or flares  - Plan add Qvar 53mcq 1 puff BID (if ineffective could increase to 2 puffs BID) - FU in 3 months  - Refer to pulmonary rehab

## 2018-05-01 NOTE — Addendum Note (Signed)
Addended by: Martyn Ehrich on: 05/01/2018 04:50 PM   Modules accepted: Orders

## 2018-05-02 ENCOUNTER — Other Ambulatory Visit: Payer: Self-pay | Admitting: Primary Care

## 2018-05-02 MED ORDER — FIRST-DUKES MOUTHWASH MT SUSP: OROMUCOSAL | 0 refills | Status: DC

## 2018-05-02 NOTE — Progress Notes (Signed)
Qvar not covered by insurance. Could look into getting a sample or using GoodRX. Taken off med list for now. Would recommend pulmonary rehab for stamina.

## 2018-05-02 NOTE — Telephone Encounter (Signed)
Patient is returning call. CB is [phone]

## 2018-05-02 NOTE — Telephone Encounter (Signed)
Please call patient, let her know there is no equivalent that Dr. Melvyn Novas would recommend to replace Qvar. We can look into getting a sample and calling her if they come in. Maybe ask Lattie Haw. Otherwise magic mouth wash not needed right now (I did send it in to [facility] and will try resending). Pulmonary rehab is the next best option for her to help with stamina.

## 2018-05-02 NOTE — Telephone Encounter (Signed)
Pt states the Rx for inhaler Qvar was not covered by her insurance.  Is there another inhaler she could try? Pt states Rx for mouth wash was not received by her pharmacy.  Beth please advise.

## 2018-05-02 NOTE — Telephone Encounter (Signed)
LMTCB 05/02/18

## 2018-05-02 NOTE — Telephone Encounter (Signed)
Kayla [name] from [facility] [phone]   Kayla [name] needs clarification on Magic mouthwash.

## 2018-05-02 NOTE — Telephone Encounter (Signed)
[place] and spoke with Estill Bamberg who stated she wanted to confirm the mouthwash. Clarified the information Estill Bamberg was needing. Nothing further needed with the pharmacy.  Attempted to call pt to let her know the information stated per Derl Barrow, NP and also in regards to the mouthwash Rx but unable to reach her. Left a message for pt to return call.

## 2018-05-02 NOTE — Telephone Encounter (Signed)
[facility], is calling to confirmation on the Rx for mouth wash. Cb is [phone].

## 2018-05-03 NOTE — Telephone Encounter (Signed)
Spoke with pt. She is aware that [facility] should get her prescription ready. Nothing further was needed.

## 2018-05-03 NOTE — Telephone Encounter (Signed)
Pt returning call. Cb is [phone].

## 2018-05-08 DIAGNOSIS — H40019 Open angle with borderline findings, low risk, unspecified eye: Secondary | ICD-10-CM | POA: Diagnosis not present

## 2018-05-08 DIAGNOSIS — H4042X3 Glaucoma secondary to eye inflammation, left eye, severe stage: Secondary | ICD-10-CM | POA: Diagnosis not present

## 2018-05-08 NOTE — Progress Notes (Signed)
Chart and office note reviewed in detail  > agree with a/p as outlined    

## 2018-05-10 DIAGNOSIS — Z79899 Other long term (current) drug therapy: Secondary | ICD-10-CM | POA: Diagnosis not present

## 2018-05-10 DIAGNOSIS — M0800 Unspecified juvenile rheumatoid arthritis of unspecified site: Secondary | ICD-10-CM | POA: Diagnosis not present

## 2018-05-15 DIAGNOSIS — H6123 Impacted cerumen, bilateral: Secondary | ICD-10-CM | POA: Diagnosis not present

## 2018-07-18 ENCOUNTER — Other Ambulatory Visit: Payer: Self-pay | Admitting: Internal Medicine

## 2018-07-18 DIAGNOSIS — R0600 Dyspnea, unspecified: Secondary | ICD-10-CM

## 2018-07-19 ENCOUNTER — Ambulatory Visit: Payer: Medicare Other | Admitting: Internal Medicine

## 2018-08-14 DIAGNOSIS — H4042X3 Glaucoma secondary to eye inflammation, left eye, severe stage: Secondary | ICD-10-CM | POA: Diagnosis not present

## 2018-08-14 DIAGNOSIS — H40019 Open angle with borderline findings, low risk, unspecified eye: Secondary | ICD-10-CM | POA: Diagnosis not present

## 2018-08-15 ENCOUNTER — Other Ambulatory Visit: Payer: Self-pay | Admitting: Internal Medicine

## 2018-08-18 DIAGNOSIS — N281 Cyst of kidney, acquired: Secondary | ICD-10-CM | POA: Diagnosis not present

## 2018-08-18 DIAGNOSIS — N393 Stress incontinence (female) (male): Secondary | ICD-10-CM | POA: Diagnosis not present

## 2018-08-18 DIAGNOSIS — N200 Calculus of kidney: Secondary | ICD-10-CM | POA: Diagnosis not present

## 2018-09-05 ENCOUNTER — Other Ambulatory Visit: Payer: Self-pay | Admitting: Urology

## 2018-09-05 DIAGNOSIS — N281 Cyst of kidney, acquired: Secondary | ICD-10-CM

## 2018-09-11 DIAGNOSIS — I499 Cardiac arrhythmia, unspecified: Secondary | ICD-10-CM | POA: Diagnosis not present

## 2018-09-13 ENCOUNTER — Ambulatory Visit: Payer: Medicare Other | Admitting: Internal Medicine

## 2018-09-13 ENCOUNTER — Ambulatory Visit (INDEPENDENT_AMBULATORY_CARE_PROVIDER_SITE_OTHER): Payer: Medicare Other | Admitting: Internal Medicine

## 2018-09-13 ENCOUNTER — Encounter: Payer: Self-pay | Admitting: Internal Medicine

## 2018-09-13 VITALS — BP 132/84 | HR 72 | Ht 59.0 in | Wt 150.0 lb

## 2018-09-13 DIAGNOSIS — J4550 Severe persistent asthma, uncomplicated: Secondary | ICD-10-CM

## 2018-09-13 DIAGNOSIS — R0600 Dyspnea, unspecified: Secondary | ICD-10-CM

## 2018-09-13 LAB — PULMONARY FUNCTION TEST
DL/VA % pred: 145 %
DL/VA: 5.93 ml/min/mmHg/L
DLCO UNC % PRED: 112 %
DLCO UNC: 19.8 ml/min/mmHg
FEF 25-75 Post: 0.38 L/sec
FEF 25-75 Pre: 0.36 L/sec
FEF2575-%Change-Post: 4 %
FEF2575-%PRED-PRE: 15 %
FEF2575-%Pred-Post: 15 %
FEV1-%Change-Post: 3 %
FEV1-%Pred-Post: 38 %
FEV1-%Pred-Pre: 37 %
FEV1-PRE: 0.85 L
FEV1-Post: 0.88 L
FEV1FVC-%Change-Post: 2 %
FEV1FVC-%Pred-Pre: 58 %
FEV6-%CHANGE-POST: 1 %
FEV6-%Pred-Post: 63 %
FEV6-%Pred-Pre: 62 %
FEV6-PRE: 1.75 L
FEV6-Post: 1.78 L
FEV6FVC-%Change-Post: 0 %
FEV6FVC-%Pred-Post: 99 %
FEV6FVC-%Pred-Pre: 99 %
FVC-%Change-Post: 0 %
FVC-%Pred-Post: 63 %
FVC-%Pred-Pre: 63 %
FVC-POST: 1.84 L
FVC-PRE: 1.83 L
POST FEV1/FVC RATIO: 48 %
POST FEV6/FVC RATIO: 97 %
Pre FEV1/FVC ratio: 47 %
Pre FEV6/FVC Ratio: 96 %
RV % PRED: 170 %
RV: 2.72 L
TLC % pred: 112 %
TLC: 4.83 L

## 2018-09-13 MED ORDER — MOMETASONE FURO-FORMOTEROL FUM 200-5 MCG/ACT IN AERO: INHALATION_SPRAY | RESPIRATORY_TRACT | 11 refills | Status: DC

## 2018-09-13 MED ORDER — BECLOMETHASONE DIPROP HFA 40 MCG/ACT IN AERB: INHALATION_SPRAY | RESPIRATORY_TRACT | 11 refills | Status: DC

## 2018-09-13 NOTE — Patient Instructions (Addendum)
Dulera 200 each am followed by Qvar 40 2 pffs and repeat 12 hours  Please see patient coordinator before you leave today  to schedule pulmonary rehab  Please schedule a follow up visit in 3 months but call sooner if needed  - check re timolol rx  And consider adding spiriva trial if not done / prn saba next

## 2018-09-13 NOTE — Progress Notes (Signed)
PFT done today. 

## 2018-09-13 NOTE — Progress Notes (Signed)
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  She weaned herself off of prednisone successfully   in 11/2007     History of Present Illness  03/22/2012 f/u ov/Kayla [name] no longer on prednisone at all for RA cc persistent chronic doe x walking dog x sev minutes, worse with hills, does fine at rest, using saba sev times per day with some improvement but not relief of sob despite advair 500 rec Dulera 200 Take 2 puffs first thing in am and then another 2 puffs about 12 hours later.  Only use your albuterol (maxair) as a rescue medication   Work on inhaler technique:   10/11/2014 f/u ov/Kayla [name] re: asthma/  dulera 200 2bid maint / prn [place] for RA per [facility] Chief Complaint  Patient presents with  . Follow-up    Pt states that her breathing "is crappy as it has always been". She is using rescue inhaler very rarely.   using recumbent bike x 30 min daily s stopping    Sleeping fine, cough not an issue No longer walking dog  rec Continue dulera 200 Take 2 puffs first thing in am and then another 2 puffs about 12 hours later.  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 Follow-up    CXR done today. Pt states her breathing is currently stable. No new co's today.   does ok on ex bike/ Not limited by breathing from desired activities  But by arthritis  rec Continue dulera 200 Take 2 puffs first thing in am and then another 2 puffs about 12 hours later.  Only use your albuterol as a rescue medication   06/02/2016   f/u ov/Kayla [name] re: RA / ? RA bronchiolitis on dulera 200 2bid / rare saba  Chief Complaint  Patient presents with  . Follow-up    Pt states she feels her baseline has changed since her last visit. She is unsure if her breathing is any worse "this may be the new normal".   main problem is first thing in am needs to grab dulera to catch her breath every day even though using it < 10h before that dose Doe = MMRC2 = can't walk a nl pace on a flat grade s sob but does fine slow and flat eg [employer] slowly  Works out 3 x weekly x 30 min but doesn't get sob  rec Weight control is simply a matter of calorie balance    Add pepcid 20 mg at bedtime for now GERD diet  Please schedule a follow up visit in 3 months but call sooner if needed with pfts > did not return as req     09/13/2018  f/u ov/Kayla [name] re: RA bronchiolitis vs asthma ? Related to gold exp on dulera 200 bid and qvar 1 pff daily but hfa poor and on timolol eyedrops  Chief Complaint  Patient presents with  . Follow-up    Breathing is unchanged since the  last visit. She does not have a rescue inhaler.    Dyspnea:  X 50 ft -note this was not reproduced at the office visit Cough: dry/ daytime Sleeping: ok flat bed SABA use: none 02: none   No obvious day to day or daytime variability or assoc excess/ purulent sputum or mucus plugs or hemoptysis or cp or chest tightness, subjective wheeze or overt sinus or hb symptoms.   Sleeping ok  without nocturnal  or early am exacerbation  of respiratory  c/o's or need for noct saba. Also denies any obvious fluctuation of symptoms with weather or environmental changes or other aggravating or alleviating factors except as outlined above   No unusual exposure hx or h/o childhood pna/ asthma or knowledge of premature birth.  Current Allergies, Complete Past Medical History, Past Surgical History, Family History, and Social History were reviewed in [employer] record.  ROS  The  following are not active complaints unless bolded Hoarseness, sore throat, dysphagia, dental problems, itching, sneezing,  nasal congestion or discharge of excess mucus or purulent secretions, ear ache,   fever, chills, sweats, unintended wt loss or wt gain, classically pleuritic or exertional cp,  orthopnea pnd or arm/hand swelling  or leg swelling, presyncope, palpitations, abdominal pain, anorexia, nausea, vomiting, diarrhea  or change in bowel habits or change in bladder habits, change in stools or change in urine, dysuria, hematuria,  rash, arthralgias better, visual complaints, headache, numbness, weakness or ataxia or problems with walking or coordination,  change in mood or  memory.        Current Meds  Medication Sig  . abatacept (ORENCIA) 250 MG injection Inject into the vein once a week.   . beclomethasone (QVAR REDIHALER) 40 MCG/ACT inhaler Take 2 puffs first thing in am and then another 2 puffs about 12 hours later.  . bimatoprost (LUMIGAN) 0.01 % SOLN As directed  . brimonidine (ALPHAGAN P) 0.1 % SOLN Place 1 drop into the left eye 2 (two) times daily.  . celecoxib (CELEBREX) 200 MG capsule Take 200 mg by mouth daily. Take 1 tablet every other day  . mometasone-formoterol (DULERA) 200-5 MCG/ACT AERO INHALE TWO PUFFS BY MOUTH EVERY MORNING AND INHALE TWO PUFFS BY MOUTH 12 HOURS LATER  . venlafaxine (EFFEXOR) 37.5 MG tablet Take 37.5 mg by mouth daily.  . [DISCONTINUED] beclomethasone (QVAR REDIHALER) 40 MCG/ACT inhaler Inhale 1 puff into the lungs daily.  . [DISCONTINUED] Diphenhyd-Hydrocort-Nystatin (FIRST-DUKES MOUTHWASH) SUSP 5 ml swish and spit qid prn thrush  . [DISCONTINUED] loteprednol (LOTEMAX) 0.5 % ophthalmic suspension Place 1 drop into the left eye 2 (two) times daily.   . [DISCONTINUED] mometasone-formoterol (DULERA) 200-5 MCG/ACT AERO INHALE TWO PUFFS BY MOUTH EVERY MORNING AND INHALE TWO PUFFS BY MOUTH 12 HOURS LATER            Past Medical History:  COUGH  (ICD-786.2)  RHINITIS, CHRONIC (ICD-472.0)  ASTHMA (ICD-493.90)  ARTHRITIS, RHEUMATOID, SEVERE (ICD-714.0)............................................Marland KitchenSchriver @ [facility]  - Sept 17 2009 started daily prednisone > tapered off        Objective:   Physical Exam    10/11/2014          141 > 06/03/2015  132 >  06/02/2016   150  > 07/13/2017   147 > 09/13/2018   150     03/22/12 131 lb 12.8 oz (59.784 kg)  08/19/09 125 lb 2.1 oz (56.759 kg)  11/13/08 122 lb 4 oz (55.452 kg)     Vital signs reviewed -  Note on arrival 02 sats  94% on RA  HEENT: nl dentition, turbinates bilaterally, and oropharynx. Nl external ear canals without cough reflex   NECK :  without JVD/Nodes/TM/ nl carotid upstrokes bilaterally   LUNGS: no acc muscle use,  kyphotic chest with minimal insp/exp rhonchi  bilaterally without cough on insp or exp maneuvers   CV:  RRR  no s3 or murmur or increase in P2, and no edema   ABD:  soft and nontender with nl inspiratory excursion in the supine position. No bruits or organomegaly appreciated, bowel sounds nl  MS:  slt waddling gait/  ext warm with classic severe RA changes both hands s calf tenderness, cyanosis or clubbing    SKIN: warm and dry without lesions    NEURO:  alert, approp, nl sensorium with  no motor or cerebellar deficits apparent.           Assessment & Plan:

## 2018-09-13 NOTE — Progress Notes (Signed)
Subjective:    Patient ID: Kayla [name], female    DOB: Sep 17, 1966    MRN: [id number]   Brief patient profile:  50yowf never smoker with a history of chronic asthma ? As child age 52  p gold exp?  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  She weaned herself off of prednisone successfully   in 11/2007     History of Present Illness  03/22/2012 f/u ov/Kayla [name] no longer on prednisone at all for RA cc persistent chronic doe x walking dog x sev minutes, worse with hills, does fine at rest, using saba sev times per day with some improvement but not relief of sob despite advair 500 rec Dulera 200 Take 2 puffs first thing in am and then another 2 puffs about 12 hours later.  Only use your albuterol (maxair) as a rescue medication   Work on inhaler technique:   10/11/2014 f/u ov/Kayla [name] re: asthma/  dulera 200 2bid maint / prn [place] for RA per [facility] Chief Complaint  Patient presents with  . Follow-up    Pt states that her breathing "is crappy as it has always been". She is using rescue inhaler very rarely.   using recumbent bike x 30 min daily s stopping    Sleeping fine, cough not an issue No longer walking dog  rec Continue dulera 200 Take 2 puffs first thing in am and then another 2 puffs about 12 hours later.  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 Follow-up    CXR done today. Pt states her breathing is currently stable. No new co's today.   does ok on ex bike/ Not limited by breathing from desired activities  But by arthritis  rec Continue dulera 200 Take 2 puffs first thing in am and then another 2 puffs about 12 hours later.  Only use your albuterol as a rescue  medication   06/02/2016  f/u ov/Kayla [name] re: RA / ? RA bronchiolitis on dulera 200 2bid / rare saba  Chief Complaint  Patient presents with  . Follow-up    Pt states she feels her baseline has changed since her last visit. She is unsure if her breathing is any worse "this may be the new normal".   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 RA bronchiolitis  On dulera 200 Take 2 puffs first thing in am and then another 2 puffs about 12 hours later.  Chief Complaint  Patient presents with  . Follow-up  needs inhalers   says RA better on orencia/ never needs saba / never flare MMRC2 = can't walk a nl pace on a flat grade s sob but does fine slow and flat eg [employer] does fine  Sleeps fine not taking hs h2  rec In event of a flare of coughing >  nexium 40 mg Take 30- 60 min before your first and last meals of the day but ok to leave off if not coughing  Continue dulera 200 Take 2 puffs first thing in am and then another 2 puffs about 12 hours later.  Please schedule a follow up visit in 12  months but call sooner if needed with pfts on return   with all medications /inhalers/ solutions in hand so we can verify exactly what you are taking. This includes all medications from all doctors and over the counters   09/13/2018  f/u ov/Kayla [name] re: RA/ RA lung dz  Chief Complaint  Patient presents with  . Follow-up    Breathing is unchanged since the last visit. She does not have a rescue inhaler.    Dyspnea:  50 ft  slow pace Cough: dry sporadic, doesn't wak her up  Sleeping: bed flat/ 1 pillow  SABA use: doesn't have one  02: none           Past Medical History:  COUGH (ICD-786.2)  RHINITIS, CHRONIC (ICD-472.0)  ASTHMA (ICD-493.90)  ARTHRITIS, RHEUMATOID, SEVERE (ICD-714.0)............................................Marland KitchenSchriver @ [facility]  - Sept 17 2009 started daily prednisone > tapered off        Objective:   Physical Exam  amb wf nad   Vital signs reviewed - Note on arrival 02 sats  94% on RA     10/11/2014          141 > 06/03/2015  132 >  06/02/2016   150  > 07/13/2017   147 > 09/13/2018  150     03/22/12 131 lb 12.8 oz (59.784 kg)  08/19/09 125 lb 2.1 oz (56.759 kg)  11/13/08 122 lb 4 oz (55.452 kg)             severe ra changes bilaterally both hands            Assessment & Plan:

## 2018-09-14 ENCOUNTER — Encounter: Payer: Self-pay | Admitting: Internal Medicine

## 2018-09-14 NOTE — Assessment & Plan Note (Addendum)
PFTs 01/28/06          FEV1 44%  ratio 39% diffusing capacity  116% with 8% improvement after B2 - PFTs 06/25/11        FEV1 0.96 (40%)   Ratio 37  - PFT's  05/30/2015  FEV1 0.90 (38 % ) ratio 48  No % improvement from saba with DLCO  127 %    p dulera 200 that am  - 06/02/2016 p extensive coaching HFA effectiveness =  90% - rec add pepcid ac 20 mg hs to see if helps am "wheeze/sob      - PFT's  07/13/2017  FEV1 0.85 (36 % ) ratio 44   p 9 % improvement from saba p dulera 200 prior to study with DLCO  114/113c % corrects to 148  % for alv volume      - .PFT's  09/13/2018  FEV1 0.88 (38 % ) ratio 48  p 3 % improvement from saba p qvar and symb prior to study with DLCO  112 % corrects to 145  % for alv volume  - 09/13/2018  After extensive coaching inhaler device,  effectiveness =    75% (short Ti)  - 09/13/2018   Walked 750 ft  stopped due to  End of study, slt reduced pace s sob  no desats   I was not able to reproduce her 50 foot dyspnea and she says it is because she took the albuterol before she walked but note there was minimal improvement after albuterol documented and I suspect some of this is placebo effect as she never reported any improvement previously from using albuterol and was very negative about response to any therapy we have offered to date in fact.  What is clear from the walking study and the PFTs is that she has no parenchymal lung disease related to rheumatoid arthritis or the medication she has taken for in the past and we can focus on treating the airway problem as follows:  1) she needs to work harder on [facility] technique.   2) if she is going to use Qvar she might as well use a maximum therapeutic dose at 2 puffs every 12 hours and then return here to regroup with all medications and hand. 3) I strongly think she would also benefit from pulmonary rehabilitation if she is willing to go > referred  4) I note that she is now on timolol eyedrops but there is been no change in her lung  function from previous studies and I doubt it is what is causing her level of dyspnea now especially since she reports so much improvement after albuterol symptomatically. 5) consider adding Spiriva trial next visit.   >> f/u in 3 m with all meds in hand using a trust but verify approach to confirm accurate Medication  Reconciliation The principal here is that until we are certain that the  patients are doing what we've asked, it makes no sense to ask them to do more.     I had an extended discussion with the patient reviewing all relevant studies completed to date and  lasting 15 to 20 minutes of a 25 minute visit    See device teaching which extended face to face time for this visit.  Each maintenance medication was reviewed in detail including emphasizing most importantly the difference between maintenance and prns and under what circumstances the prns are to be triggered using an action plan format that is not reflected in the computer  generated alphabetically organized AVS which I have not found useful in most complex patients, especially with respiratory illnesses  Please see AVS for specific instructions unique to this visit that I personally wrote and verbalized to the the pt in detail and then reviewed with pt  by my nurse highlighting any  changes in therapy recommended at today's visit to their plan of care.

## 2018-09-18 ENCOUNTER — Other Ambulatory Visit: Payer: Medicare Other

## 2018-09-20 ENCOUNTER — Ambulatory Visit
Admission: RE | Admit: 2018-09-20 | Discharge: 2018-09-20 | Disposition: A | Discharge status: Home / Self Care | Payer: Medicare Other | Source: Ambulatory Visit | Attending: Urology | Admitting: Urology

## 2018-09-20 ENCOUNTER — Other Ambulatory Visit: Payer: Medicare Other

## 2018-09-20 DIAGNOSIS — N289 Disorder of kidney and ureter, unspecified: Secondary | ICD-10-CM | POA: Diagnosis not present

## 2018-09-20 DIAGNOSIS — N281 Cyst of kidney, acquired: Secondary | ICD-10-CM

## 2018-09-20 MED ORDER — GADOBENATE DIMEGLUMINE 529 MG/ML IV SOLN
14.0000 mL | Freq: Once | INTRAVENOUS | Status: AC | PRN
  Administered 2018-09-20: 14 mL via INTRAVENOUS

## 2018-09-21 ENCOUNTER — Telehealth (HOSPITAL_COMMUNITY): Payer: Self-pay

## 2018-09-21 NOTE — Telephone Encounter (Signed)
Attempted to call patient in regards to Pulmonary Rehab - LM on VM °

## 2018-09-21 NOTE — Telephone Encounter (Signed)
Pt insurance is active and benefits verified through [place]. Co-pay $0.00, DED $0.00/$0.00 met, out of pocket $5,000.00/$702.83 met, co-insurance 20%. No pre-authorization required. [place]/[employer], 09/21/18 @ 12:31PM, REF# [id number]

## 2018-10-09 DIAGNOSIS — N200 Calculus of kidney: Secondary | ICD-10-CM | POA: Diagnosis not present

## 2018-10-09 DIAGNOSIS — N281 Cyst of kidney, acquired: Secondary | ICD-10-CM | POA: Diagnosis not present

## 2018-10-10 ENCOUNTER — Telehealth (HOSPITAL_COMMUNITY): Payer: Self-pay

## 2018-10-13 ENCOUNTER — Encounter (HOSPITAL_COMMUNITY): Payer: Self-pay

## 2018-10-13 ENCOUNTER — Encounter (HOSPITAL_COMMUNITY)
Admission: RE | Admit: 2018-10-13 | Discharge: 2018-10-13 | Disposition: A | Discharge status: Home / Self Care | Payer: Medicare Other | Source: Ambulatory Visit | Attending: Internal Medicine | Admitting: Internal Medicine

## 2018-10-13 VITALS — BP 115/72 | HR 85 | Temp 98.6°F | Resp 16 | Ht 60.0 in | Wt 151.7 lb

## 2018-10-13 DIAGNOSIS — J4550 Severe persistent asthma, uncomplicated: Secondary | ICD-10-CM

## 2018-10-13 NOTE — Progress Notes (Signed)
Kayla [name] 53 y.o. female Pulmonary Rehab Orientation Note Kayla [name] referred to pulmonary rehab from [facility] for sever persistent asthmas without complications. Patient arrived today in Cardiac and Pulmonary Rehab for orientation to Pulmonary Rehab. She walked in from parking area with no issues. She does not carry portable oxygen. Per pt, she uses oxygen never. Color good, skin warm and dry. Patient is oriented to time and place. Patient's medical history, psychosocial health, and medications reviewed. Psychosocial assessment reveals pt lies with her teenage daughter. Pt is currently working part time as a Transport planner specifically working with anxiety, depression, and bipolar adults and teenagers . Pt hobbies include meeting up with friends as well as a book club . Pt reports her stress level is moderate with levels fluctuating based on what is going on in her life. Areas of stress/anxiety include her health. She states she get very stressed when she has health "storms." She states she feels like a lot of things happen with her health at the same time and that is when she gets stressed.  Pt does not exhibit signs of depression. She states that she does have a history of depression, which she is currently on medication (Effexor) for. PHQ2/9 score 0/6. Pt shows fair  coping skills. She does state that she gets easily "annoyed," especially when it comes to getting short of breath, but does have a positive outlook. Her daughter does have some learning disabilities, but pt reports she is in a good school and reports no stress with the issue. Pt also states she is divorced, but her relationship with her ex- husband is very good and they co-parent as well. Will continue to monitor and evaluate progress toward psychosocial goal(s) of maintaining a positive outlook and remaining hopefully and , per pt, wanting to "make health more of a priority". Physical assessment reveals heart rate is normal, breath sounds clear to  auscultation, no wheezes, rales, or rhonchi. Grip strength equal, strong. Patient reports she does take medications as prescribed. Patient states she follows a Regular diet. Pt states she does wish to lose weight, but did not report any active efforts.. Patient's weight will be monitored closely. Demonstration and practice of PLB. Patient able to return demonstration satisfactorily. Safety and hand hygiene in the exercise area reviewed with patient. Patient voices understanding of the information reviewed. Department expectations discussed with patient and achievable goals were set. The patient shows enthusiasm about attending the program and we look forward to working with this nice lady. The patient is scheduled for a 6 min walk test on 10/17/2018 and to begin exercise on 10/24/2018.   0930-1100  Kayla Man RN, BSN Cardiac and Pulmonary Rehab RN

## 2018-10-17 ENCOUNTER — Encounter (HOSPITAL_COMMUNITY)
Admission: RE | Admit: 2018-10-17 | Discharge: 2018-10-17 | Disposition: A | Discharge status: Home / Self Care | Payer: Medicare Other | Source: Ambulatory Visit | Attending: Internal Medicine | Admitting: Internal Medicine

## 2018-10-17 ENCOUNTER — Other Ambulatory Visit: Payer: Self-pay | Admitting: Obstetrics and Gynecology

## 2018-10-17 DIAGNOSIS — J4550 Severe persistent asthma, uncomplicated: Secondary | ICD-10-CM

## 2018-10-17 DIAGNOSIS — N644 Mastodynia: Secondary | ICD-10-CM

## 2018-10-17 NOTE — Progress Notes (Signed)
Pulmonary Individual Treatment Plan  Patient Details  Name: Kayla [name] MRN: [id number] Date of Birth: 08-May-1966 Referring Provider:     Pulmonary Rehab Walk Test from 10/17/2018 in [place]  Referring Provider  Dr. Melvyn Novas      Initial Encounter Date:    Pulmonary Rehab Walk Test from 10/17/2018 in [place]  Date  10/17/18      Visit Diagnosis: Severe persistent asthma without complication  Patient's Home Medications on Admission:   Current Outpatient Medications:  .  abatacept (ORENCIA) 250 MG injection, Inject into the vein once a week. , Disp: , Rfl:  .  beclomethasone (QVAR REDIHALER) 40 MCG/ACT inhaler, Take 2 puffs first thing in am and then another 2 puffs about 12 hours later., Disp: 1 Inhaler, Rfl: 11 .  bimatoprost (LUMIGAN) 0.01 % SOLN, As directed, Disp: , Rfl:  .  brimonidine (ALPHAGAN P) 0.1 % SOLN, Place 1 drop into the left eye 2 (two) times daily., Disp: , Rfl:  .  celecoxib (CELEBREX) 200 MG capsule, Take 200 mg by mouth daily. Take 1 tablet every other day, Disp: , Rfl:  .  mometasone-formoterol (DULERA) 200-5 MCG/ACT AERO, INHALE TWO PUFFS BY MOUTH EVERY MORNING AND INHALE TWO PUFFS BY MOUTH 12 HOURS LATER, Disp: 1 Inhaler, Rfl: 11 .  venlafaxine (EFFEXOR) 37.5 MG tablet, Take 37.5 mg by mouth daily., Disp: , Rfl:   Past Medical History: Past Medical History:  Diagnosis Date  . Asthma    daily inhaler  . Dental crowns present   . GERD (gastroesophageal reflux disease)   . Glaucoma   . History of kidney stones   . Limited joint range of motion    cervical spine and jaw - due to RA  . Rheumatoid arthritis(714.0)   . Scalp cyst 10/2016    Tobacco Use: Social History   Tobacco Use  Smoking Status Never Smoker  Smokeless Tobacco Never Used    Labs: Recent Review Flowsheet Data    Labs for ITP Cardiac and Pulmonary Rehab 04/17/2008   TCO2 27      Capillary Blood Glucose: No  results found for: GLUCAP   Pulmonary Assessment Scores: Pulmonary Assessment Scores    Row Name 10/13/18 1033         ADL UCSD   SOB Score total  46       CAT Score   CAT Score  12        Pulmonary Function Assessment: Pulmonary Function Assessment - 10/13/18 1028      Breath   Bilateral Breath Sounds  Clear    Shortness of Breath  Limiting activity   "annoyed"      Exercise Target Goals: Exercise Program Goal: Individual exercise prescription set using results from initial 6 min walk test and THRR while considering  patient's activity barriers and safety.   Exercise Prescription Goal: Initial exercise prescription builds to 30-45 minutes a day of aerobic activity, 2-3 days per week.  Home exercise guidelines will be given to patient during program as part of exercise prescription that the participant will acknowledge.  Activity Barriers & Risk Stratification: Activity Barriers & Cardiac Risk Stratification - 10/13/18 1020      Activity Barriers & Cardiac Risk Stratification   Activity Barriers  Arthritis;Right Knee Replacement;Left Knee Replacement;Right Hip Replacement;Joint Problems;Balance Concerns;Neck/Spine Problems;Shortness of Breath       6 Minute Walk: 6 Minute Walk    Row Name 10/17/18 1308  6 Minute Walk   Phase  Initial     Distance  1148 feet     Walk Time  6 minutes     # of Rest Breaks  0     MPH  2.17     METS  2.68     RPE  13     Perceived Dyspnea   1     Symptoms  Yes (comment)     Comments  neck pain 5/10, hip and lower back pain 4/10     Resting HR  85 bpm     Resting BP  106/68     Resting Oxygen Saturation   97 %     Exercise Oxygen Saturation  during 6 min walk  93 %     Max Ex. HR  111 bpm     Max Ex. BP  128/72     2 Minute Post BP  108/68       Interval HR   1 Minute HR  97     2 Minute HR  109     3 Minute HR  107     4 Minute HR  108     5 Minute HR  109     6 Minute HR  111     2 Minute Post HR  83      Interval Heart Rate?  Yes       Interval Oxygen   Interval Oxygen?  Yes     Baseline Oxygen Saturation %  97 %     1 Minute Oxygen Saturation %  93 %     1 Minute Liters of Oxygen  0 L     2 Minute Oxygen Saturation %  92 %     2 Minute Liters of Oxygen  0 L     3 Minute Oxygen Saturation %  93 %     3 Minute Liters of Oxygen  0 L     4 Minute Oxygen Saturation %  93 %     4 Minute Liters of Oxygen  0 L     5 Minute Oxygen Saturation %  94 %     5 Minute Liters of Oxygen  0 L     6 Minute Oxygen Saturation %  94 %     6 Minute Liters of Oxygen  0 L     2 Minute Post Oxygen Saturation %  96 %     2 Minute Post Liters of Oxygen  0 L        Oxygen Initial Assessment: Oxygen Initial Assessment - 10/13/18 1027      Home Oxygen   Home Oxygen Device  None    Sleep Oxygen Prescription  None    Home Exercise Oxygen Prescription  None    Home at Rest Exercise Oxygen Prescription  None       Oxygen Re-Evaluation:   Oxygen Discharge (Final Oxygen Re-Evaluation):   Initial Exercise Prescription: Initial Exercise Prescription - 10/17/18 1300      Date of Initial Exercise RX and Referring Provider   Date  10/17/18    Referring Provider  Dr. Melvyn Novas      Recumbant Bike   Level  1.5    Watts  10    Minutes  17      NuStep   Level  2    SPM  80    Minutes  19      Track  Laps  10    Minutes  17      Prescription Details   Frequency (times per week)  2    Duration  Progress to 45 minutes of aerobic exercise without signs/symptoms of physical distress      Intensity   THRR 40-80% of Max Heartrate  67-134    Ratings of Perceived Exertion  11-13    Perceived Dyspnea  0-4      Progression   Progression  Continue to progress workloads to maintain intensity without signs/symptoms of physical distress.      Resistance Training   Training Prescription  Yes    Weight  orange bands    Reps  10-15       Perform Capillary Blood Glucose checks as needed.  Exercise  Prescription Changes:   Exercise Comments:   Exercise Goals and Review: Exercise Goals    Row Name 10/13/18 1022             Exercise Goals   Increase Physical Activity  Yes       Intervention  Provide advice, education, support and counseling about physical activity/exercise needs.;Develop an individualized exercise prescription for aerobic and resistive training based on initial evaluation findings, risk stratification, comorbidities and participant's personal goals.       Expected Outcomes  Short Term: Attend rehab on a regular basis to increase amount of physical activity.;Long Term: Add in home exercise to make exercise part of routine and to increase amount of physical activity.;Long Term: Exercising regularly at least 3-5 days a week.       Increase Strength and Stamina  Yes       Intervention  Provide advice, education, support and counseling about physical activity/exercise needs.;Develop an individualized exercise prescription for aerobic and resistive training based on initial evaluation findings, risk stratification, comorbidities and participant's personal goals.       Expected Outcomes  Short Term: Increase workloads from initial exercise prescription for resistance, speed, and METs.       Able to understand and use rate of perceived exertion (RPE) scale  Yes       Intervention  Provide education and explanation on how to use RPE scale       Expected Outcomes  Short Term: Able to use RPE daily in rehab to express subjective intensity level;Long Term:  Able to use RPE to guide intensity level when exercising independently       Able to understand and use Dyspnea scale  Yes       Intervention  Provide education and explanation on how to use Dyspnea scale       Expected Outcomes  Short Term: Able to use Dyspnea scale daily in rehab to express subjective sense of shortness of breath during exertion;Long Term: Able to use Dyspnea scale to guide intensity level when exercising  independently       Knowledge and understanding of Target Heart Rate Range (THRR)  Yes       Intervention  Provide education and explanation of THRR including how the numbers were predicted and where they are located for reference       Expected Outcomes  Short Term: Able to state/look up THRR;Long Term: Able to use THRR to govern intensity when exercising independently;Short Term: Able to use daily as guideline for intensity in rehab       Understanding of Exercise Prescription  Yes       Intervention  Provide education, explanation, and written materials on patient's individual  exercise prescription       Expected Outcomes  Short Term: Able to explain program exercise prescription;Long Term: Able to explain home exercise prescription to exercise independently          Exercise Goals Re-Evaluation :   Discharge Exercise Prescription (Final Exercise Prescription Changes):   Nutrition:  Target Goals: Understanding of nutrition guidelines, daily intake of sodium 1500mg , cholesterol 200mg , calories 30% from fat and 7% or less from saturated fats, daily to have 5 or more servings of fruits and vegetables.  Biometrics: Pre Biometrics - 10/13/18 1021      Pre Biometrics   Grip Strength  15 kg        Nutrition Therapy Plan and Nutrition Goals:   Nutrition Assessments:   Nutrition Goals Re-Evaluation:   Nutrition Goals Discharge (Final Nutrition Goals Re-Evaluation):   Psychosocial: Target Goals: Acknowledge presence or absence of significant depression and/or stress, maximize coping skills, provide positive support system. Participant is able to verbalize types and ability to use techniques and skills needed for reducing stress and depression.  Initial Review & Psychosocial Screening: Initial Psych Review & Screening - 10/13/18 1047      Initial Review   Current issues with  History of Depression;Current Anxiety/Panic      Family Dynamics   Good Support System?  Yes       Barriers   Psychosocial barriers to participate in program  The patient should benefit from training in stress management and relaxation.      Screening Interventions   Interventions  Encouraged to exercise    Expected Outcomes  Long Term Goal: Stressors or current issues are controlled or eliminated.;Short Term goal: Utilizing psychosocial counselor, staff and physician to assist with identification of specific Stressors or current issues interfering with healing process. Setting desired goal for each stressor or current issue identified.       Quality of Life Scores:  Scores of 19 and below usually indicate a poorer quality of life in these areas.  A difference of  2-3 points is a clinically meaningful difference.  A difference of 2-3 points in the total score of the Quality of Life Index has been associated with significant improvement in overall quality of life, self-image, physical symptoms, and general health in studies assessing change in quality of life.   PHQ-9: Recent Review Flowsheet Data    Depression screen [facility] 2/9 10/13/2018   Decreased Interest 0   Down, Depressed, Hopeless 0   PHQ - 2 Score 0   Altered sleeping 0   Tired, decreased energy 3   Change in appetite 3   Feeling bad or failure about yourself  0   Trouble concentrating 0   Moving slowly or fidgety/restless 0   Suicidal thoughts 0   PHQ-9 Score 6   Difficult doing work/chores Not difficult at all     Interpretation of Total Score  Total Score Depression Severity:  1-4 = Minimal depression, 5-9 = Mild depression, 10-14 = Moderate depression, 15-19 = Moderately severe depression, 20-27 = Severe depression   Psychosocial Evaluation and Intervention: Psychosocial Evaluation - 10/13/18 1050      Psychosocial Evaluation & Interventions   Interventions  Stress management education;Relaxation education;Encouraged to exercise with the program and follow exercise prescription    Comments  Pt is on Effexor and  has successful therapy. Pt has a history of depression, but states she feels it is well controlled. She states that she has stress related to health "storms" where she feels "  old" and "discouraged. She states she is hopeful    Expected Outcomes  pt will continue to remain hopeful and maintain a positive outlook    Continue Psychosocial Services   Follow up required by staff       Psychosocial Re-Evaluation:   Psychosocial Discharge (Final Psychosocial Re-Evaluation):    Education: Education Goals: Education classes will be provided on a weekly basis, covering required topics. Participant will state understanding/return demonstration of topics presented.  Learning Barriers/Preferences:   Education Topics: How Lungs Work and Diseases: - Discuss the anatomy of the lungs and diseases that can affect the lungs, such as COPD.   Exercise: -Discuss the importance of exercise, FITT principles of exercise, normal and abnormal responses to exercise, and how to exercise safely.   Environmental Irritants: -Discuss types of environmental irritants and how to limit exposure to environmental irritants.   Meds/Inhalers and oxygen: - Discuss respiratory medications, definition of an inhaler and oxygen, and the proper way to use an inhaler and oxygen.   Energy Saving Techniques: - Discuss methods to conserve energy and decrease shortness of breath when performing activities of daily living.    Bronchial Hygiene / Breathing Techniques: - Discuss breathing mechanics, pursed-lip breathing technique,  proper posture, effective ways to clear airways, and other functional breathing techniques   Cleaning Equipment: - Provides group verbal and written instruction about the health risks of elevated stress, cause of high stress, and healthy ways to reduce stress.   Nutrition I: Fats: - Discuss the types of cholesterol, what cholesterol does to the body, and how cholesterol levels can be  controlled.   Nutrition II: Labels: -Discuss the different components of food labels and how to read food labels.   Respiratory Infections: - Discuss the signs and symptoms of respiratory infections, ways to prevent respiratory infections, and the importance of seeking medical treatment when having a respiratory infection.   Stress I: Signs and Symptoms: - Discuss the causes of stress, how stress may lead to anxiety and depression, and ways to limit stress.   Stress II: Relaxation: -Discuss relaxation techniques to limit stress.   Oxygen for Home/Travel: - Discuss how to prepare for travel when on oxygen and proper ways to transport and store oxygen to ensure safety.   Knowledge Questionnaire Score: Knowledge Questionnaire Score - 10/13/18 1043      Knowledge Questionnaire Score   Pre Score  12/18       Core Components/Risk Factors/Patient Goals at Admission: Personal Goals and Risk Factors at Admission - 10/13/18 1053      Core Components/Risk Factors/Patient Goals on Admission    Weight Management  Weight Loss;Yes    Intervention  Weight Management: Develop a combined nutrition and exercise program designed to reach desired caloric intake, while maintaining appropriate intake of nutrient and fiber, sodium and fats, and appropriate energy expenditure required for the weight goal.    Admit Weight  151 lb 10.8 oz (68.8 kg)    Goal Weight: Short Term  141 lb (64 kg)    Expected Outcomes  Short Term: Continue to assess and modify interventions until short term weight is achieved;Long Term: Adherence to nutrition and physical activity/exercise program aimed toward attainment of established weight goal    Improve shortness of breath with ADL's  Yes    Intervention  Provide education, individualized exercise plan and daily activity instruction to help decrease symptoms of SOB with activities of daily living.    Expected Outcomes  Short Term: Improve cardiorespiratory fitness to  achieve a reduction of symptoms when performing ADLs;Long Term: Be able to perform more ADLs without symptoms or delay the onset of symptoms    Stress  Yes    Intervention  Offer individual and/or small group education and counseling on adjustment to heart disease, stress management and health-related lifestyle change. Teach and support self-help strategies.;Refer participants experiencing significant psychosocial distress to appropriate mental health specialists for further evaluation and treatment. When possible, include family members and significant others in education/counseling sessions.    Expected Outcomes  Short Term: Participant demonstrates changes in health-related behavior, relaxation and other stress management skills, ability to obtain effective social support, and compliance with psychotropic medications if prescribed.;Long Term: Emotional wellbeing is indicated by absence of clinically significant psychosocial distress or social isolation.       Core Components/Risk Factors/Patient Goals Review:  Goals and Risk Factor Review    Row Name 10/13/18 1055             Core Components/Risk Factors/Patient Goals Review   Personal Goals Review  Weight Management/Obesity;Improve shortness of breath with ADL's;Develop more efficient breathing techniques such as purse lipped breathing and diaphragmatic breathing and practicing self-pacing with activity.;Increase knowledge of respiratory medications and ability to use respiratory devices properly.;Stress          Core Components/Risk Factors/Patient Goals at Discharge (Final Review):  Goals and Risk Factor Review - 10/13/18 1055      Core Components/Risk Factors/Patient Goals Review   Personal Goals Review  Weight Management/Obesity;Improve shortness of breath with ADL's;Develop more efficient breathing techniques such as purse lipped breathing and diaphragmatic breathing and practicing self-pacing with activity.;Increase knowledge of  respiratory medications and ability to use respiratory devices properly.;Stress       ITP Comments: ITP Comments    Row Name 10/13/18 0956           ITP Comments  Dr. Jennet Maduro, medical director, Pulmonary Rehab          Comments:

## 2018-10-24 ENCOUNTER — Encounter (HOSPITAL_COMMUNITY)
Admission: RE | Admit: 2018-10-24 | Discharge: 2018-10-24 | Disposition: A | Discharge status: Home / Self Care | Payer: Medicare Other | Source: Ambulatory Visit | Attending: Internal Medicine | Admitting: Internal Medicine

## 2018-10-24 VITALS — Wt 151.7 lb

## 2018-10-24 DIAGNOSIS — J4550 Severe persistent asthma, uncomplicated: Secondary | ICD-10-CM

## 2018-10-24 NOTE — Progress Notes (Signed)
Daily Session Note  Patient Details  Name: Kayla [name] MRN: [id number] Date of Birth: 08-Nov-1965 Referring Provider:     Pulmonary Rehab Walk Test from 10/17/2018 in [place]  Referring Provider  Dr. Melvyn Novas      Encounter Date: 10/24/2018  Check In: Session Check In - 10/24/18 1330      Check-In   Supervising physician immediately available to respond to emergencies  [facility] immediately available    Physician(s)  Dr. Waldron Labs    Location  [facility]    Staff Present  [name] RN, BSN;Carlette Wilber Oliphant, RN, Bjorn Loser, MS, Exercise Physiologist;Chancellor Vanderloop Leonia Reeves, RN, BSN    Medication changes reported      No    Fall or balance concerns reported     No    Tobacco Cessation  No Change    Resistance Training Performed  Yes    VAD Patient?  No    PAD/SET Patient?  No      Pain Assessment   Multiple Pain Sites  No       Capillary Blood Glucose: No results found for this or any previous visit (from the past 24 hour(s)).  Exercise Prescription Changes - 10/24/18 1600      Response to Exercise   Blood Pressure (Admit)  118/78    Blood Pressure (Exercise)  132/70    Blood Pressure (Exit)  98/72    Heart Rate (Admit)  106 bpm    Heart Rate (Exercise)  133 bpm    Heart Rate (Exit)  107 bpm    Oxygen Saturation (Admit)  95 %    Oxygen Saturation (Exercise)  95 %    Oxygen Saturation (Exit)  95 %    Rating of Perceived Exertion (Exercise)  13    Perceived Dyspnea (Exercise)  2    Duration  Progress to 45 minutes of aerobic exercise without signs/symptoms of physical distress    Intensity  --   40-80% HRR     Progression   Progression  Continue to progress workloads to maintain intensity without signs/symptoms of physical distress.      Resistance Training   Training Prescription  Yes    Weight  orange bands    Reps  10-15    Time  10 Minutes      Interval Training   Interval Training  No      Recumbant Bike   Level  2    Watts  10    Minutes  17      NuStep   Level  2    SPM  80    Minutes  17    METs  1.7       Social History   Tobacco Use  Smoking Status Never Smoker  Smokeless Tobacco Never Used    Goals Met:  Proper associated with RPD/PD & O2 Sat Exercise tolerated well Strength training completed today  Goals Unmet:  Not Applicable  Comments: Service time is from 1330 to 1500    Dr. Rush Farmer is Medical Director for Pulmonary Rehab at [facility].

## 2018-10-25 ENCOUNTER — Ambulatory Visit
Admission: RE | Admit: 2018-10-25 | Discharge: 2018-10-25 | Disposition: A | Discharge status: Home / Self Care | Payer: Medicare Other | Source: Ambulatory Visit | Attending: Obstetrics and Gynecology | Admitting: Obstetrics and Gynecology

## 2018-10-25 DIAGNOSIS — N644 Mastodynia: Secondary | ICD-10-CM

## 2018-10-25 DIAGNOSIS — N6489 Other specified disorders of breast: Secondary | ICD-10-CM | POA: Diagnosis not present

## 2018-10-25 DIAGNOSIS — R922 Inconclusive mammogram: Secondary | ICD-10-CM | POA: Diagnosis not present

## 2018-10-26 ENCOUNTER — Encounter (HOSPITAL_COMMUNITY)
Admission: RE | Admit: 2018-10-26 | Discharge: 2018-10-26 | Disposition: A | Discharge status: Home / Self Care | Payer: Medicare Other | Source: Ambulatory Visit | Attending: Internal Medicine | Admitting: Internal Medicine

## 2018-10-26 DIAGNOSIS — J4550 Severe persistent asthma, uncomplicated: Secondary | ICD-10-CM

## 2018-10-26 NOTE — Progress Notes (Signed)
Incomplete Session Note  Patient Details  Name: Kayla [name] MRN: [id number] Date of Birth: [birth year] Referring Provider:     Pulmonary Rehab Walk Test from 10/17/2018 in [place]  Referring Provider  Dr. Vanita Ingles did not complete her rehab session.  Pt arrived to pulmonary rehab for her second session. This account is from staff and was not observed by the Probation officer.  Pt was asked to weigh along with vital signs.  Pt refused to get up to weigh. In a effort to help the scale was brought to her for her to weigh.  Pt again refused for a second time.  Staff talked to her regarding the rationale and reasoning for weights each session.  At this point, Pt became very angry and verbally abusive to staff and pulmonary rehab volunteer.  Pt used profanity toward Kayla [name] EP in front of the other participants for exercise.  Pt stated that the pulmonary rehab staff was being "disrespectful". Kayla Lipps RN replied that "respect is a two way street". I observed pt heading toward the department lobby with coat in hand leaving.  Asked to speak to pt in private.  Led pt to the treatment room. Sat with pt.  Pt visually upset with tears and taking her hair in her hand and using her fingers to comb through.  Pt verbalized anger that she was having a "bad day" and she did not understand why her request to not weigh was not honored. Asked Kayla [name] what about getting weighed upset her.  Kayla [name] could not verbalize why she didn't want to weigh.  Offered her reasons why she may not want to weigh. She denied feeling self conscious about her weight in front of others or herself.  She kept repeating that she just didn't want to weigh today. Talked to her about why we have our patients weigh.  At first she stated angrily that  none of those reasons has anything to do with me. After pt cried more she conceded that she understood now why she needed to weigh. Kayla [name] kept talking about her bad day  she was having. Asked her what was bad about today.  Kayla [name] stated that she just found out her daughter who is a [place] has a eating disorder and her ex husband was not being supportive. She was having a conversation with a fellow participant and she was interrupted. 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 Pt complained of feeling sore and in pain from the previous exercise session. Asked Kayla [name] did she feel helpless or out of control. 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 Offered pt continues  encouragement and support.  Asked if she could reschedule her two clients to tomorrow.  She felt she could and thought that would be best. 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  Advised her to see if the school has resources. Pt becoming less agitated toward me and laughed about how ridiculous she reacted.  I told pt that this would not be a good day to exercise and that she should  take some time for herself to practice some self care so that she could be a help to her daughter and clients.  Kayla [name] agreed that this would be a good idea.  Kayla [name] plans to do her bands and exercise on the bike at home. Advised pt that her behavior toward staff was inappropriate and a second offense would not be tolerated. If she becomes verbally abusive with staff and/or volunteers she would be dismissed from the program.  Kayla [name] understood and plans to return to exercise on Tuesday.  Will follow up with her by phone on tomorrow.  Kayla [name], BSN Cardiac and Training and development officer

## 2018-10-31 ENCOUNTER — Encounter (HOSPITAL_COMMUNITY)
Admission: RE | Admit: 2018-10-31 | Discharge: 2018-10-31 | Disposition: A | Discharge status: Home / Self Care | Payer: Medicare Other | Source: Ambulatory Visit | Attending: Internal Medicine | Admitting: Internal Medicine

## 2018-10-31 DIAGNOSIS — J4550 Severe persistent asthma, uncomplicated: Secondary | ICD-10-CM

## 2018-10-31 NOTE — Progress Notes (Signed)
Daily Session Note  Patient Details  Name: Kayla [name] MRN: [id number] Date of Birth: 1966-09-06 Referring Provider:     Pulmonary Rehab Walk Test from 10/17/2018 in [place]  Referring Provider  Dr. Melvyn Novas      Encounter Date: 10/31/2018  Check In: Session Check In - 10/31/18 1515      Check-In   Supervising physician immediately available to respond to emergencies  [facility] immediately available    Physician(s)  Dr. Teryl Lucy    Location  [facility]    Staff Present  [name] RN, Maxcine Ham, RN, Bjorn Loser, MS, Exercise Physiologist;Molly DiVincenzo, MS, ACSM RCEP, Exercise Physiologist    Medication changes reported      No    Fall or balance concerns reported     No    Tobacco Cessation  No Change    Warm-up and Cool-down  Performed as group-led instruction    Resistance Training Performed  Yes    VAD Patient?  No    PAD/SET Patient?  No      Pain Assessment   Currently in Pain?  No/denies    Pain Score  0-No pain    Multiple Pain Sites  No       Capillary Blood Glucose: No results found for this or any previous visit (from the past 24 hour(s)).    Social History   Tobacco Use  Smoking Status Never Smoker  Smokeless Tobacco Never Used    Goals Met:  Proper associated with RPD/PD & O2 Sat Exercise tolerated well Strength training completed today  Goals Unmet:  Not Applicable  Comments: Service time is from 1330 to 1500    Dr. Rush Farmer is Medical Director for Pulmonary Rehab at [facility].

## 2018-11-02 ENCOUNTER — Encounter (HOSPITAL_COMMUNITY)
Admission: RE | Admit: 2018-11-02 | Discharge: 2018-11-02 | Disposition: A | Discharge status: Home / Self Care | Payer: Medicare Other | Source: Ambulatory Visit | Attending: Internal Medicine | Admitting: Internal Medicine

## 2018-11-02 DIAGNOSIS — J4550 Severe persistent asthma, uncomplicated: Secondary | ICD-10-CM | POA: Diagnosis not present

## 2018-11-02 NOTE — Progress Notes (Signed)
Daily Session Note  Patient Details  Name: Kayla [name] MRN: [id number] Date of Birth: 1966/06/08 Referring Provider:     Pulmonary Rehab Walk Test from 10/17/2018 in [place]  Referring Provider  Dr. Melvyn Novas      Encounter Date: 11/02/2018  Check In: Session Check In - 11/02/18 1334      Check-In   Supervising physician immediately available to respond to emergencies  [facility] immediately available    Physician(s)  Dr. Waldron Labs    Location  [facility]    Staff Present  [name] RN, BSN;Carlette Wilber Oliphant, RN, Bjorn Loser, MS, Exercise Physiologist;Briar Sword Ysidro Evert, RN;Molly DiVincenzo, MS, ACSM RCEP, Exercise Physiologist    Medication changes reported      No    Fall or balance concerns reported     No    Tobacco Cessation  No Change    Warm-up and Cool-down  Performed as group-led instruction    Resistance Training Performed  Yes    VAD Patient?  No    PAD/SET Patient?  No      Pain Assessment   Currently in Pain?  No/denies    Pain Score  0-No pain    Multiple Pain Sites  No       Capillary Blood Glucose: No results found for this or any previous visit (from the past 24 hour(s)).    Social History   Tobacco Use  Smoking Status Never Smoker  Smokeless Tobacco Never Used    Goals Met:  Exercise tolerated well No report of cardiac concerns or symptoms Strength training completed today  Goals Unmet:  Not Applicable  Comments: Service time is from 1330 to 1530    Dr. Rush Farmer is Medical Director for Pulmonary Rehab at [facility].

## 2018-11-07 ENCOUNTER — Encounter (HOSPITAL_COMMUNITY)
Admission: RE | Admit: 2018-11-07 | Discharge: 2018-11-07 | Disposition: A | Discharge status: Home / Self Care | Payer: Medicare Other | Source: Ambulatory Visit | Attending: Internal Medicine | Admitting: Internal Medicine

## 2018-11-07 VITALS — Wt 150.6 lb

## 2018-11-07 DIAGNOSIS — J4550 Severe persistent asthma, uncomplicated: Secondary | ICD-10-CM | POA: Diagnosis not present

## 2018-11-07 NOTE — Progress Notes (Signed)
Pulmonary Individual Treatment Plan  Patient Details  Name: Kayla [name] MRN: [id number] Date of Birth: 08-May-1966 Referring Provider:     Pulmonary Rehab Walk Test from 10/17/2018 in [place]  Referring Provider  Dr. Melvyn Novas      Initial Encounter Date:    Pulmonary Rehab Walk Test from 10/17/2018 in [place]  Date  10/17/18      Visit Diagnosis: Severe persistent asthma without complication  Patient's Home Medications on Admission:   Current Outpatient Medications:  .  abatacept (ORENCIA) 250 MG injection, Inject into the vein once a week. , Disp: , Rfl:  .  beclomethasone (QVAR REDIHALER) 40 MCG/ACT inhaler, Take 2 puffs first thing in am and then another 2 puffs about 12 hours later., Disp: 1 Inhaler, Rfl: 11 .  bimatoprost (LUMIGAN) 0.01 % SOLN, As directed, Disp: , Rfl:  .  brimonidine (ALPHAGAN P) 0.1 % SOLN, Place 1 drop into the left eye 2 (two) times daily., Disp: , Rfl:  .  celecoxib (CELEBREX) 200 MG capsule, Take 200 mg by mouth daily. Take 1 tablet every other day, Disp: , Rfl:  .  mometasone-formoterol (DULERA) 200-5 MCG/ACT AERO, INHALE TWO PUFFS BY MOUTH EVERY MORNING AND INHALE TWO PUFFS BY MOUTH 12 HOURS LATER, Disp: 1 Inhaler, Rfl: 11 .  venlafaxine (EFFEXOR) 37.5 MG tablet, Take 37.5 mg by mouth daily., Disp: , Rfl:   Past Medical History: Past Medical History:  Diagnosis Date  . Asthma    daily inhaler  . Dental crowns present   . GERD (gastroesophageal reflux disease)   . Glaucoma   . History of kidney stones   . Limited joint range of motion    cervical spine and jaw - due to RA  . Rheumatoid arthritis(714.0)   . Scalp cyst 10/2016    Tobacco Use: Social History   Tobacco Use  Smoking Status Never Smoker  Smokeless Tobacco Never Used    Labs: Recent Review Flowsheet Data    Labs for ITP Cardiac and Pulmonary Rehab 04/17/2008   TCO2 27      Capillary Blood Glucose: No  results found for: GLUCAP   Pulmonary Assessment Scores: Pulmonary Assessment Scores    Row Name 10/13/18 1033         ADL UCSD   SOB Score total  46       CAT Score   CAT Score  12        Pulmonary Function Assessment: Pulmonary Function Assessment - 10/13/18 1028      Breath   Bilateral Breath Sounds  Clear    Shortness of Breath  Limiting activity   "annoyed"      Exercise Target Goals: Exercise Program Goal: Individual exercise prescription set using results from initial 6 min walk test and THRR while considering  patient's activity barriers and safety.   Exercise Prescription Goal: Initial exercise prescription builds to 30-45 minutes a day of aerobic activity, 2-3 days per week.  Home exercise guidelines will be given to patient during program as part of exercise prescription that the participant will acknowledge.  Activity Barriers & Risk Stratification: Activity Barriers & Cardiac Risk Stratification - 10/13/18 1020      Activity Barriers & Cardiac Risk Stratification   Activity Barriers  Arthritis;Right Knee Replacement;Left Knee Replacement;Right Hip Replacement;Joint Problems;Balance Concerns;Neck/Spine Problems;Shortness of Breath       6 Minute Walk: 6 Minute Walk    Row Name 10/17/18 1308  6 Minute Walk   Phase  Initial     Distance  1148 feet     Walk Time  6 minutes     # of Rest Breaks  0     MPH  2.17     METS  2.68     RPE  13     Perceived Dyspnea   1     Symptoms  Yes (comment)     Comments  neck pain 5/10, hip and lower back pain 4/10     Resting HR  85 bpm     Resting BP  106/68     Resting Oxygen Saturation   97 %     Exercise Oxygen Saturation  during 6 min walk  93 %     Max Ex. HR  111 bpm     Max Ex. BP  128/72     2 Minute Post BP  108/68       Interval HR   1 Minute HR  97     2 Minute HR  109     3 Minute HR  107     4 Minute HR  108     5 Minute HR  109     6 Minute HR  111     2 Minute Post HR  83      Interval Heart Rate?  Yes       Interval Oxygen   Interval Oxygen?  Yes     Baseline Oxygen Saturation %  97 %     1 Minute Oxygen Saturation %  93 %     1 Minute Liters of Oxygen  0 L     2 Minute Oxygen Saturation %  92 %     2 Minute Liters of Oxygen  0 L     3 Minute Oxygen Saturation %  93 %     3 Minute Liters of Oxygen  0 L     4 Minute Oxygen Saturation %  93 %     4 Minute Liters of Oxygen  0 L     5 Minute Oxygen Saturation %  94 %     5 Minute Liters of Oxygen  0 L     6 Minute Oxygen Saturation %  94 %     6 Minute Liters of Oxygen  0 L     2 Minute Post Oxygen Saturation %  96 %     2 Minute Post Liters of Oxygen  0 L        Oxygen Initial Assessment: Oxygen Initial Assessment - 11/06/18 0731      Home Oxygen   Home Oxygen Device  None    Sleep Oxygen Prescription  None    Home Exercise Oxygen Prescription  None    Home at Rest Exercise Oxygen Prescription  None       Oxygen Re-Evaluation:   Oxygen Discharge (Final Oxygen Re-Evaluation):   Initial Exercise Prescription: Initial Exercise Prescription - 10/17/18 1300      Date of Initial Exercise RX and Referring Provider   Date  10/17/18    Referring Provider  Dr. Melvyn Novas      Recumbant Bike   Level  1.5    Watts  10    Minutes  17      NuStep   Level  2    SPM  80    Minutes  57      Track  Laps  10    Minutes  17      Prescription Details   Frequency (times per week)  2    Duration  Progress to 45 minutes of aerobic exercise without signs/symptoms of physical distress      Intensity   THRR 40-80% of Max Heartrate  67-134    Ratings of Perceived Exertion  11-13    Perceived Dyspnea  0-4      Progression   Progression  Continue to progress workloads to maintain intensity without signs/symptoms of physical distress.      Resistance Training   Training Prescription  Yes    Weight  orange bands    Reps  10-15       Perform Capillary Blood Glucose checks as needed.  Exercise  Prescription Changes: Exercise Prescription Changes    Row Name 10/24/18 1600 11/07/18 1500           Response to Exercise   Blood Pressure (Admit)  118/78  110/70      Blood Pressure (Exercise)  132/70  130/70      Blood Pressure (Exit)  98/72  114/70      Heart Rate (Admit)  106 bpm  93 bpm      Heart Rate (Exercise)  133 bpm  115 bpm      Heart Rate (Exit)  107 bpm  96 bpm      Oxygen Saturation (Admit)  95 %  95 %      Oxygen Saturation (Exercise)  95 %  94 %      Oxygen Saturation (Exit)  95 %  98 %      Rating of Perceived Exertion (Exercise)  13  17      Perceived Dyspnea (Exercise)  2  2      Duration  Progress to 45 minutes of aerobic exercise without signs/symptoms of physical distress  Progress to 45 minutes of aerobic exercise without signs/symptoms of physical distress      Intensity  - 40-80% HRR  THRR unchanged        Progression   Progression  Continue to progress workloads to maintain intensity without signs/symptoms of physical distress.  Continue to progress workloads to maintain intensity without signs/symptoms of physical distress.        Resistance Training   Training Prescription  Yes  Yes      Weight  orange bands  orange bands      Reps  10-15  10-15      Time  10 Minutes  10 Minutes        Interval Training   Interval Training  No  No        Recumbant Bike   Level  2  2      Watts  10  10      Minutes  17  17        NuStep   Level  2  2      SPM  80  80      Minutes  17  17      METs  1.7  1.9        Track   Laps  -  10      Minutes  -  17         Exercise Comments:   Exercise Goals and Review: Exercise Goals    Row Name 10/13/18 1022             Exercise  Goals   Increase Physical Activity  Yes       Intervention  Provide advice, education, support and counseling about physical activity/exercise needs.;Develop an individualized exercise prescription for aerobic and resistive training based on initial evaluation findings, risk  stratification, comorbidities and participant's personal goals.       Expected Outcomes  Short Term: Attend rehab on a regular basis to increase amount of physical activity.;Long Term: Add in home exercise to make exercise part of routine and to increase amount of physical activity.;Long Term: Exercising regularly at least 3-5 days a week.       Increase Strength and Stamina  Yes       Intervention  Provide advice, education, support and counseling about physical activity/exercise needs.;Develop an individualized exercise prescription for aerobic and resistive training based on initial evaluation findings, risk stratification, comorbidities and participant's personal goals.       Expected Outcomes  Short Term: Increase workloads from initial exercise prescription for resistance, speed, and METs.       Able to understand and use rate of perceived exertion (RPE) scale  Yes       Intervention  Provide education and explanation on how to use RPE scale       Expected Outcomes  Short Term: Able to use RPE daily in rehab to express subjective intensity level;Long Term:  Able to use RPE to guide intensity level when exercising independently       Able to understand and use Dyspnea scale  Yes       Intervention  Provide education and explanation on how to use Dyspnea scale       Expected Outcomes  Short Term: Able to use Dyspnea scale daily in rehab to express subjective sense of shortness of breath during exertion;Long Term: Able to use Dyspnea scale to guide intensity level when exercising independently       Knowledge and understanding of Target Heart Rate Range (THRR)  Yes       Intervention  Provide education and explanation of THRR including how the numbers were predicted and where they are located for reference       Expected Outcomes  Short Term: Able to state/look up THRR;Long Term: Able to use THRR to govern intensity when exercising independently;Short Term: Able to use daily as guideline for  intensity in rehab       Understanding of Exercise Prescription  Yes       Intervention  Provide education, explanation, and written materials on patient's individual exercise prescription       Expected Outcomes  Short Term: Able to explain program exercise prescription;Long Term: Able to explain home exercise prescription to exercise independently          Exercise Goals Re-Evaluation : Exercise Goals Re-Evaluation    Row Name 11/06/18 0712             Exercise Goal Re-Evaluation   Exercise Goals Review  Increase Physical Activity;Increase Strength and Stamina;Able to understand and use rate of perceived exertion (RPE) scale;Able to understand and use Dyspnea scale;Knowledge and understanding of Target Heart Rate Range (THRR);Understanding of Exercise Prescription       Comments  Patient has only completed 3 rehab sessions. Patient's MET level falls in a low level. Will continue to monitor patient and progress as able.       Expected Outcomes  Through exercise at rehab, patient will increase physical capacity, decrease shortness of breath, and feel confident in implementing and exercise routine at  home. Also, when patient completes their graduation 6MWT they will increase their walk test distance by atleast 100 feet.           Discharge Exercise Prescription (Final Exercise Prescription Changes): Exercise Prescription Changes - 11/07/18 1500      Response to Exercise   Blood Pressure (Admit)  110/70    Blood Pressure (Exercise)  130/70    Blood Pressure (Exit)  114/70    Heart Rate (Admit)  93 bpm    Heart Rate (Exercise)  115 bpm    Heart Rate (Exit)  96 bpm    Oxygen Saturation (Admit)  95 %    Oxygen Saturation (Exercise)  94 %    Oxygen Saturation (Exit)  98 %    Rating of Perceived Exertion (Exercise)  17    Perceived Dyspnea (Exercise)  2    Duration  Progress to 45 minutes of aerobic exercise without signs/symptoms of physical distress    Intensity  THRR unchanged       Progression   Progression  Continue to progress workloads to maintain intensity without signs/symptoms of physical distress.      Resistance Training   Training Prescription  Yes    Weight  orange bands    Reps  10-15    Time  10 Minutes      Interval Training   Interval Training  No      Recumbant Bike   Level  2    Watts  10    Minutes  17      NuStep   Level  2    SPM  80    Minutes  17    METs  1.9      Track   Laps  10    Minutes  17       Nutrition:  Target Goals: Understanding of nutrition guidelines, daily intake of sodium '1500mg'$ , cholesterol '200mg'$ , calories 30% from fat and 7% or less from saturated fats, daily to have 5 or more servings of fruits and vegetables.  Biometrics: Pre Biometrics - 10/13/18 1021      Pre Biometrics   Grip Strength  15 kg        Nutrition Therapy Plan and Nutrition Goals:   Nutrition Assessments:   Nutrition Goals Re-Evaluation:   Nutrition Goals Discharge (Final Nutrition Goals Re-Evaluation):   Psychosocial: Target Goals: Acknowledge presence or absence of significant depression and/or stress, maximize coping skills, provide positive support system. Participant is able to verbalize types and ability to use techniques and skills needed for reducing stress and depression.  Initial Review & Psychosocial Screening: Initial Psych Review & Screening - 10/13/18 1047      Initial Review   Current issues with  History of Depression;Current Anxiety/Panic      Family Dynamics   Good Support System?  Yes      Barriers   Psychosocial barriers to participate in program  The patient should benefit from training in stress management and relaxation.      Screening Interventions   Interventions  Encouraged to exercise    Expected Outcomes  Long Term Goal: Stressors or current issues are controlled or eliminated.;Short Term goal: Utilizing psychosocial counselor, staff and physician to assist with identification of specific  Stressors or current issues interfering with healing process. Setting desired goal for each stressor or current issue identified.       Quality of Life Scores:  Scores of 19 and below usually indicate a poorer quality of  life in these areas.  A difference of  2-3 points is a clinically meaningful difference.  A difference of 2-3 points in the total score of the Quality of Life Index has been associated with significant improvement in overall quality of life, self-image, physical symptoms, and general health in studies assessing change in quality of life.  PHQ-9: Recent Review Flowsheet Data    Depression screen [facility] 2/9 10/13/2018   Decreased Interest 0   Down, Depressed, Hopeless 0   PHQ - 2 Score 0   Altered sleeping 0   Tired, decreased energy 3   Change in appetite 3   Feeling bad or failure about yourself  0   Trouble concentrating 0   Moving slowly or fidgety/restless 0   Suicidal thoughts 0   PHQ-9 Score 6   Difficult doing work/chores Not difficult at all     Interpretation of Total Score  Total Score Depression Severity:  1-4 = Minimal depression, 5-9 = Mild depression, 10-14 = Moderate depression, 15-19 = Moderately severe depression, 20-27 = Severe depression   Psychosocial Evaluation and Intervention: Psychosocial Evaluation - 10/13/18 1050      Psychosocial Evaluation & Interventions   Interventions  Stress management education;Relaxation education;Encouraged to exercise with the program and follow exercise prescription    Comments  Pt is on Effexor and has successful therapy. Pt has a history of depression, but states she feels it is well controlled. She states that she has stress related to health "storms" where she feels "old" and "discouraged. She states she is hopeful    Expected Outcomes  pt will continue to remain hopeful and maintain a positive outlook    Continue Psychosocial Services   Follow up required by staff       Psychosocial  Re-Evaluation: Psychosocial Re-Evaluation    [place] Name 11/07/18 1614             Psychosocial Re-Evaluation   Current issues with  History of Depression;Current Stress Concerns       Comments  Patient had verbal outbursts with hostility in the first 2 exercise sessions, she was counselled that this is not appropriate behavior and if it happened again she would be discharged from the program.       Expected Outcomes  Appropriate behavior while in pulmonary rehab       Interventions  Encouraged to attend Pulmonary Rehabilitation for the exercise;Relaxation education;Stress management education       Continue Psychosocial Services   Follow up required by staff       Comments  Confided that her teenage daughter causes much stress in her life and she has difficulty dealing with chronic illness         Initial Review   Source of Stress Concerns  Chronic Illness;Family          Psychosocial Discharge (Final Psychosocial Re-Evaluation): Psychosocial Re-Evaluation - 11/07/18 1614      Psychosocial Re-Evaluation   Current issues with  History of Depression;Current Stress Concerns    Comments  Patient had verbal outbursts with hostility in the first 2 exercise sessions, she was counselled that this is not appropriate behavior and if it happened again she would be discharged from the program.    Expected Outcomes  Appropriate behavior while in pulmonary rehab    Interventions  Encouraged to attend Pulmonary Rehabilitation for the exercise;Relaxation education;Stress management education    Continue Psychosocial Services   Follow up required by staff    Comments  Confided that  her teenage daughter causes much stress in her life and she has difficulty dealing with chronic illness      Initial Review   Source of Stress Concerns  Chronic Illness;Family       Education: Education Goals: Education classes will be provided on a weekly basis, covering required topics. Participant will state  understanding/return demonstration of topics presented.  Learning Barriers/Preferences:   Education Topics: Risk Factor Reduction:  -Group instruction that is supported by a PowerPoint presentation. Instructor discusses the definition of a risk factor, different risk factors for pulmonary disease, and how the heart and lungs work together.     Nutrition for Pulmonary Patient:  -Group instruction provided by PowerPoint slides, verbal discussion, and written materials to support subject matter. The instructor gives an explanation and review of healthy diet recommendations, which includes a discussion on weight management, recommendations for fruit and vegetable consumption, as well as protein, fluid, caffeine, fiber, sodium, sugar, and alcohol. Tips for eating when patients are short of breath are discussed.   Pursed Lip Breathing:  -Group instruction that is supported by demonstration and informational handouts. Instructor discusses the benefits of pursed lip and diaphragmatic breathing and detailed demonstration on how to preform both.     Oxygen Safety:  -Group instruction provided by PowerPoint, verbal discussion, and written material to support subject matter. There is an overview of "What is Oxygen" and "Why do we need it".  Instructor also reviews how to create a safe environment for oxygen use, the importance of using oxygen as prescribed, and the risks of noncompliance. There is a brief discussion on traveling with oxygen and resources the patient may utilize.   Oxygen Equipment:  -Group instruction provided by [facility] Staff utilizing handouts, written materials, and equipment demonstrations.   Signs and Symptoms:  -Group instruction provided by written material and verbal discussion to support subject matter. Warning signs and symptoms of infection, stroke, and heart attack are reviewed and when to call the physician/911 reinforced. Tips for preventing the spread of infection  discussed.   Advanced Directives:  -Group instruction provided by verbal instruction and written material to support subject matter. Instructor reviews Advanced Directive laws and proper instruction for filling out document.   Pulmonary Video:  -Group video education that reviews the importance of medication and oxygen compliance, exercise, good nutrition, pulmonary hygiene, and pursed lip and diaphragmatic breathing for the pulmonary patient.   Exercise for the Pulmonary Patient:  -Group instruction that is supported by a PowerPoint presentation. Instructor discusses benefits of exercise, core components of exercise, frequency, duration, and intensity of an exercise routine, importance of utilizing pulse oximetry during exercise, safety while exercising, and options of places to exercise outside of rehab.     Pulmonary Medications:  -Verbally interactive group education provided by instructor with focus on inhaled medications and proper administration.   PULMONARY REHAB OTHER RESPIRATORY from 11/02/2018 in [place]  Date  10/24/18  Educator  Pharmacist  Instruction Review Code  2- Demonstrated Understanding      Anatomy and Physiology of the Respiratory System and Intimacy:  -Group instruction provided by PowerPoint, verbal discussion, and written material to support subject matter. Instructor reviews respiratory cycle and anatomical components of the respiratory system and their functions. Instructor also reviews differences in obstructive and restrictive respiratory diseases with examples of each. Intimacy, Sex, and Sexuality differences are reviewed with a discussion on how relationships can change when diagnosed with pulmonary disease. Common sexual concerns are reviewed.  PULMONARY REHAB OTHER RESPIRATORY from 11/02/2018 in [place]  Date  11/02/18  Educator  RN  Instruction Review Code  1- Verbalizes Understanding       MD DAY -A group question and answer session with a medical doctor that allows participants to ask questions that relate to their pulmonary disease state.   OTHER EDUCATION -Group or individual verbal, written, or video instructions that support the educational goals of the pulmonary rehab program.   Holiday Eating Survival Tips:  -Group instruction provided by PowerPoint slides, verbal discussion, and written materials to support subject matter. The instructor gives patients tips, tricks, and techniques to help them not only survive but enjoy the holidays despite the onslaught of food that accompanies the holidays.   Knowledge Questionnaire Score: Knowledge Questionnaire Score - 10/13/18 1043      Knowledge Questionnaire Score   Pre Score  12/18       Core Components/Risk Factors/Patient Goals at Admission: Personal Goals and Risk Factors at Admission - 10/13/18 1053      Core Components/Risk Factors/Patient Goals on Admission    Weight Management  Weight Loss;Yes    Intervention  Weight Management: Develop a combined nutrition and exercise program designed to reach desired caloric intake, while maintaining appropriate intake of nutrient and fiber, sodium and fats, and appropriate energy expenditure required for the weight goal.    Admit Weight  151 lb 10.8 oz (68.8 kg)    Goal Weight: Short Term  141 lb (64 kg)    Expected Outcomes  Short Term: Continue to assess and modify interventions until short term weight is achieved;Long Term: Adherence to nutrition and physical activity/exercise program aimed toward attainment of established weight goal    Improve shortness of breath with ADL's  Yes    Intervention  Provide education, individualized exercise plan and daily activity instruction to help decrease symptoms of SOB with activities of daily living.    Expected Outcomes  Short Term: Improve cardiorespiratory fitness to achieve a reduction of symptoms when performing ADLs;Long  Term: Be able to perform more ADLs without symptoms or delay the onset of symptoms    Stress  Yes    Intervention  Offer individual and/or small group education and counseling on adjustment to heart disease, stress management and health-related lifestyle change. Teach and support self-help strategies.;Refer participants experiencing significant psychosocial distress to appropriate mental health specialists for further evaluation and treatment. When possible, include family members and significant others in education/counseling sessions.    Expected Outcomes  Short Term: Participant demonstrates changes in health-related behavior, relaxation and other stress management skills, ability to obtain effective social support, and compliance with psychotropic medications if prescribed.;Long Term: Emotional wellbeing is indicated by absence of clinically significant psychosocial distress or social isolation.       Core Components/Risk Factors/Patient Goals Review:  Goals and Risk Factor Review    Row Name 10/13/18 1055 11/07/18 1620           Core Components/Risk Factors/Patient Goals Review   Personal Goals Review  Weight Management/Obesity;Improve shortness of breath with ADL's;Develop more efficient breathing techniques such as purse lipped breathing and diaphragmatic breathing and practicing self-pacing with activity.;Increase knowledge of respiratory medications and ability to use respiratory devices properly.;Stress  Weight Management/Obesity;Improve shortness of breath with ADL's;Stress      Review  -  Just started program, too early to have met any goals, the last 3 sessions her mood was positive and in control, seems to be  enjoying the program.      Expected Outcomes  -  See admission goals         Core Components/Risk Factors/Patient Goals at Discharge (Final Review):  Goals and Risk Factor Review - 11/07/18 1620      Core Components/Risk Factors/Patient Goals Review   Personal Goals Review   Weight Management/Obesity;Improve shortness of breath with ADL's;Stress    Review  Just started program, too early to have met any goals, the last 3 sessions her mood was positive and in control, seems to be enjoying the program.    Expected Outcomes  See admission goals       ITP Comments: ITP Comments    Row Name 10/13/18 0956           ITP Comments  Dr. Jennet Maduro, medical director, Pulmonary Rehab          Comments: ITP REVIEW Pt is making expected progress toward pulmonary rehab goals after completing 5 sessions. Recommend continued exercise, life style modification, education, and utilization of breathing techniques to increase stamina and strength and decrease shortness of breath with exertion.

## 2018-11-07 NOTE — Progress Notes (Signed)
Daily Session Note  Patient Details  Name: Kayla [name] MRN: [id number] Date of Birth: 10/10/1965 Referring Provider:     Pulmonary Rehab Walk Test from 10/17/2018 in [place]  Referring Provider  Dr. Melvyn Novas      Encounter Date: 11/07/2018  Check In: Session Check In - 11/07/18 1330      Check-In   Supervising physician immediately available to respond to emergencies  [facility] immediately available    Physician(s)  Dr. Tawanna Solo    Location  [facility]    Staff Present  [name] RN, BSN;Dalton Kris Mouton, MS, Exercise Physiologist;Nellene Courtois Ysidro Evert, RN;Joan Leonia Reeves, RN, BSN    Medication changes reported      No    Fall or balance concerns reported     No    Tobacco Cessation  No Change    Warm-up and Cool-down  Performed as group-led instruction    Resistance Training Performed  Yes    VAD Patient?  No    PAD/SET Patient?  No      Pain Assessment   Currently in Pain?  No/denies    Multiple Pain Sites  No       Capillary Blood Glucose: No results found for this or any previous visit (from the past 24 hour(s)).  Exercise Prescription Changes - 11/07/18 1500      Response to Exercise   Blood Pressure (Admit)  110/70    Blood Pressure (Exercise)  130/70    Blood Pressure (Exit)  114/70    Heart Rate (Admit)  93 bpm    Heart Rate (Exercise)  115 bpm    Heart Rate (Exit)  96 bpm    Oxygen Saturation (Admit)  95 %    Oxygen Saturation (Exercise)  94 %    Oxygen Saturation (Exit)  98 %    Rating of Perceived Exertion (Exercise)  17    Perceived Dyspnea (Exercise)  2    Duration  Progress to 45 minutes of aerobic exercise without signs/symptoms of physical distress    Intensity  THRR unchanged      Progression   Progression  Continue to progress workloads to maintain intensity without signs/symptoms of physical distress.      Resistance Training   Training Prescription  Yes    Weight  orange bands    Reps  10-15     Time  10 Minutes      Interval Training   Interval Training  No      Recumbant Bike   Level  2    Watts  10    Minutes  17      NuStep   Level  2    SPM  80    Minutes  17    METs  1.9      Track   Laps  10    Minutes  17       Social History   Tobacco Use  Smoking Status Never Smoker  Smokeless Tobacco Never Used    Goals Met:  Exercise tolerated well No report of cardiac concerns or symptoms Strength training completed today  Goals Unmet:  Not Applicable  Comments: Service time is from 1330 to 1500    Dr. Rush Farmer is Medical Director for Pulmonary Rehab at [name].

## 2018-11-09 ENCOUNTER — Telehealth (HOSPITAL_COMMUNITY): Payer: Self-pay | Admitting: Internal Medicine

## 2018-11-09 ENCOUNTER — Encounter (HOSPITAL_COMMUNITY): Payer: Medicare Other

## 2018-11-14 ENCOUNTER — Encounter (HOSPITAL_COMMUNITY)
Admission: RE | Admit: 2018-11-14 | Discharge: 2018-11-14 | Disposition: A | Discharge status: Home / Self Care | Payer: Medicare Other | Source: Ambulatory Visit | Attending: Internal Medicine | Admitting: Internal Medicine

## 2018-11-14 DIAGNOSIS — J4550 Severe persistent asthma, uncomplicated: Secondary | ICD-10-CM | POA: Diagnosis not present

## 2018-11-14 DIAGNOSIS — Z124 Encounter for screening for malignant neoplasm of cervix: Secondary | ICD-10-CM | POA: Diagnosis not present

## 2018-11-14 DIAGNOSIS — Z01419 Encounter for gynecological examination (general) (routine) without abnormal findings: Secondary | ICD-10-CM | POA: Diagnosis not present

## 2018-11-14 NOTE — Progress Notes (Signed)
Daily Session Note  Patient Details  Name: Kayla [name] MRN: [id number] Date of Birth: 1966/09/12 Referring Provider:     Pulmonary Rehab Walk Test from 10/17/2018 in [place]  Referring Provider  Dr. Melvyn Novas      Encounter Date: 11/14/2018  Check In: Session Check In - 11/14/18 1330      Check-In   Supervising physician immediately available to respond to emergencies  [facility] immediately available    Physician(s)  Dr. Broadus John    Location  [facility]    Staff Present  Rosebud Poles, RN, BSN;Molly DiVincenzo, MS, ACSM RCEP, Exercise Physiologist;Dalton Kris Mouton, MS, Exercise Physiologist;Lisa Ysidro Evert, RN    Medication changes reported      No    Fall or balance concerns reported     No    Tobacco Cessation  No Change    Warm-up and Cool-down  Performed as group-led instruction    Resistance Training Performed  Yes    VAD Patient?  No    PAD/SET Patient?  No      Pain Assessment   Currently in Pain?  No/denies    Multiple Pain Sites  No       Capillary Blood Glucose: No results found for this or any previous visit (from the past 24 hour(s)).    Social History   Tobacco Use  Smoking Status Never Smoker  Smokeless Tobacco Never Used    Goals Met:  Proper associated with RPD/PD & O2 Sat Exercise tolerated well Strength training completed today  Goals Unmet:  Not Applicable  Comments: Service time is from 1330 to 1500    Dr. Rush Farmer is Medical Director for Pulmonary Rehab at [facility].

## 2018-11-16 ENCOUNTER — Encounter (HOSPITAL_COMMUNITY)
Admission: RE | Admit: 2018-11-16 | Discharge: 2018-11-16 | Disposition: A | Discharge status: Home / Self Care | Payer: Medicare Other | Source: Ambulatory Visit | Attending: Internal Medicine | Admitting: Internal Medicine

## 2018-11-16 DIAGNOSIS — J4550 Severe persistent asthma, uncomplicated: Secondary | ICD-10-CM | POA: Diagnosis not present

## 2018-11-16 NOTE — Progress Notes (Signed)
Daily Session Note  Patient Details  Name: Kayla [name] MRN: [id number] Date of Birth: 1966-03-06 Referring Provider:     Pulmonary Rehab Walk Test from 10/17/2018 in [place]  Referring Provider  Dr. Melvyn Novas      Encounter Date: 11/16/2018  Check In: Session Check In - 11/16/18 1356      Check-In   Supervising physician immediately available to respond to emergencies  [facility] immediately available    Physician(s)  Dr. Berle Mull    Location  [facility]    Staff Present  Rosebud Poles, RN, BSN;Molly DiVincenzo, MS, ACSM RCEP, Exercise Physiologist;Dalton Kris Mouton, MS, Exercise Physiologist;Lisa Ysidro Evert, RN;Carlette Carlton, RN, BSN    Medication changes reported      No    Fall or balance concerns reported     No    Tobacco Cessation  No Change    Warm-up and Cool-down  Performed as group-led instruction    Resistance Training Performed  Yes    VAD Patient?  No    PAD/SET Patient?  No      Pain Assessment   Currently in Pain?  No/denies    Multiple Pain Sites  No       Capillary Blood Glucose: No results found for this or any previous visit (from the past 24 hour(s)).    Social History   Tobacco Use  Smoking Status Never Smoker  Smokeless Tobacco Never Used    Goals Met:  Proper associated with RPD/PD & O2 Sat Exercise tolerated well Strength training completed today  Goals Unmet:  Not Applicable  Comments: Service time is from 1330 to 1525.   Dr. Rush Farmer is Medical Director for Pulmonary Rehab at [facility].

## 2018-11-17 NOTE — Progress Notes (Signed)
Kayla [name] 53 y.o. female  DOB: Jun 10, 1966 MRN: [id number]           Nutrition Note 1. Severe persistent asthma without complication    Past Medical History:  Diagnosis Date  . Asthma    daily inhaler  . Dental crowns present   . GERD (gastroesophageal reflux disease)   . Glaucoma   . History of kidney stones   . Limited joint range of motion    cervical spine and jaw - due to RA  . Rheumatoid arthritis(714.0)   . Scalp cyst 10/2016   Meds reviewed.     Current Outpatient Medications (Respiratory):  .  beclomethasone (QVAR REDIHALER) 40 MCG/ACT inhaler, Take 2 puffs first thing in am and then another 2 puffs about 12 hours later. .  mometasone-formoterol (DULERA) 200-5 MCG/ACT AERO, INHALE TWO PUFFS BY MOUTH EVERY MORNING AND INHALE TWO PUFFS BY MOUTH 12 HOURS LATER  Current Outpatient Medications (Analgesics):  .  abatacept (ORENCIA) 250 MG injection, Inject into the vein once a week.  .  celecoxib (CELEBREX) 200 MG capsule, Take 200 mg by mouth daily. Take 1 tablet every other day   Current Outpatient Medications (Other):  .  bimatoprost (LUMIGAN) 0.01 % SOLN, As directed .  brimonidine (ALPHAGAN P) 0.1 % SOLN, Place 1 drop into the left eye 2 (two) times daily. Marland Kitchen  venlafaxine (EFFEXOR) 37.5 MG tablet, Take 37.5 mg by mouth daily.   Ht: Ht Readings from Last 1 Encounters:  10/13/18 5' (1.524 m)     Wt:  Wt Readings from Last 6 Encounters:  11/07/18 150 lb 9.2 oz (68.3 kg)  10/24/18 151 lb 10.8 oz (68.8 kg)  10/13/18 151 lb 10.8 oz (68.8 kg)  09/13/18 150 lb (68 kg)  05/01/18 149 lb (67.6 kg)  07/13/17 147 lb (66.7 kg)     BMI = 29.62 (10/13/18)    Current tobacco use? No    Labs:  Lipid Panel  No results found for: CHOL, TRIG, HDL, CHOLHDL, VLDL, LDLCALC, LDLDIRECT  No results found for: HGBA1C  Nutrition Diagnosis ? Food-and nutrition-related knowledge deficit related to lack of exposure to information as related to diagnosis of pulmonary  disease ? Overweight/obesity related to excessive energy intake as evidenced by a BMI of 29.62 (10/13/18)  Goal(s) 1. Pt to build a healthy plate including fruits, vegetables, whole grains, lean protein, healthy fat and low fat dairy 2. Weigh and measure portions for accuracy 3. Practice mindful eating exercises  Plan:  Pt to attend Pulmonary Nutrition class Will provide client-centered nutrition education as part of interdisciplinary care.    Monitor and Evaluate progress toward nutrition goal with team.   Laurina Bustle, MS, RD, LDN 11/17/2018 8:36 AM

## 2018-11-21 ENCOUNTER — Encounter (HOSPITAL_COMMUNITY)
Admission: RE | Admit: 2018-11-21 | Discharge: 2018-11-21 | Disposition: A | Discharge status: Home / Self Care | Payer: Medicare Other | Source: Ambulatory Visit | Attending: Internal Medicine | Admitting: Internal Medicine

## 2018-11-21 VITALS — Wt 147.5 lb

## 2018-11-21 DIAGNOSIS — J4550 Severe persistent asthma, uncomplicated: Secondary | ICD-10-CM

## 2018-11-21 NOTE — Progress Notes (Signed)
Kayla [name] 53 y.o. female   DOB: 1966-07-04 MRN: [id number]          Nutrition 1. Severe persistent asthma without complication    Past Medical History:  Diagnosis Date  . Asthma    daily inhaler  . Dental crowns present   . GERD (gastroesophageal reflux disease)   . Glaucoma   . History of kidney stones   . Limited joint range of motion    cervical spine and jaw - due to RA  . Rheumatoid arthritis(714.0)   . Scalp cyst 10/2016     Meds reviewed.    Current Outpatient Medications (Respiratory):  .  beclomethasone (QVAR REDIHALER) 40 MCG/ACT inhaler, Take 2 puffs first thing in am and then another 2 puffs about 12 hours later. .  mometasone-formoterol (DULERA) 200-5 MCG/ACT AERO, INHALE TWO PUFFS BY MOUTH EVERY MORNING AND INHALE TWO PUFFS BY MOUTH 12 HOURS LATER  Current Outpatient Medications (Analgesics):  .  abatacept (ORENCIA) 250 MG injection, Inject into the vein once a week.  .  celecoxib (CELEBREX) 200 MG capsule, Take 200 mg by mouth daily. Take 1 tablet every other day   Current Outpatient Medications (Other):  .  bimatoprost (LUMIGAN) 0.01 % SOLN, As directed .  brimonidine (ALPHAGAN P) 0.1 % SOLN, Place 1 drop into the left eye 2 (two) times daily. Marland Kitchen  venlafaxine (EFFEXOR) 37.5 MG tablet, Take 37.5 mg by mouth daily.  Ht: Ht Readings from Last 1 Encounters:  10/13/18 5' (1.524 m)     Wt:  Wt Readings from Last 3 Encounters:  11/07/18 150 lb 9.2 oz (68.3 kg)  10/24/18 151 lb 10.8 oz (68.8 kg)  10/13/18 151 lb 10.8 oz (68.8 kg)     BMI: There is no height or weight on file to calculate BMI.     Current tobacco use? No  Labs:  Lipid Panel  No results found for: CHOL, TRIG, HDL, CHOLHDL, VLDL, LDLCALC, LDLDIRECT  No results found for: HGBA1C Note Spoke with pt. Pt is overweight, and would like to lose 15-20 lbs.  Pt eats 3 meals a day; most prepared at home/ through a meal service. Pt is able to get low sodium heart healthy options for herself. Pt  also has a focus on eating lean protein and increasing her vegetables. Dicussed with patient that this is great but to also consider including complex carbs/whole grains, non-fat dairy, and fruit in with this meal plan. Pt had rigid ideas about eating, but is willing to practice mindful eating exercises suggested by dietitian today. Making healthy food choices the majority of the time.  Pt's Rate Your Plate results reviewed with pt. Pt avoids salty food; doesn't use canned/ convenience food.  Pt does not add salt to food.  The role of sodium in lung disease reviewed with pt. Pt expressed understanding of the information reviewed.  Nutrition Diagnosis ? Food-and nutrition-related knowledge deficit related to lack of exposure to information as related to diagnosis of pulmonary disease ? Overweight/obesity related to excessive energy intake as evidenced by a BMI of 29.41  Nutrition Intervention ? Pt's individual nutrition plan and goals reviewed with pt. ? Benefits of adopting healthy eating habits discussed when pt's Rate Your Plate reviewed.  Goal(s) 1. Identify food quantities necessary to achieve wt loss of  -2# per week to a goal wt loss of 6-24 lb at graduation from pulmonary rehab. 2. Pt to weigh and measure portions for accuracy 3. Describe the benefit of including fruits,  vegetables, whole grains, and low-fat dairy products in a healthy meal plan. 4. Pt to practice mindful eating exercises  Plan:  Pt to attend Pulmonary Nutrition class Will provide client-centered nutrition education as part of interdisciplinary care.    Monitor and Evaluate progress toward nutrition goal with team.   Laurina Bustle, MS, RD, LDN 11/21/2018 2:55 PM

## 2018-11-21 NOTE — Progress Notes (Signed)
I have reviewed a Home Exercise Prescription with Kayla [name] . Kayla [name] is currently exercising at home.  The patient was advised to walk 2 days a week for 30-45 minutes.  Kayla [name] and I discussed how to progress their exercise prescription.  The patient stated that their goals were to lose 35 pounds and establish an exercise routine.  The patient stated that they understand the exercise prescription.  We reviewed exercise guidelines, target heart rate during exercise, RPE Scale, weather conditions, NTG use, endpoints for exercise, warmup and cool down.  Patient is encouraged to come to me with any questions. I will continue to follow up with the patient to assist them with progression and safety.

## 2018-11-21 NOTE — Progress Notes (Signed)
Daily Session Note  Patient Details  Name: LATRICE STORLIE MRN: [id number] Date of Birth: [birth year] Referring Provider:     Pulmonary Rehab Walk Test from 10/17/2018 in [place]  Referring Provider  Dr. Melvyn Novas      Encounter Date: 11/21/2018  Check In: Session Check In - 11/21/18 1405      Check-In   Supervising physician immediately available to respond to emergencies  [facility] immediately available    Physician(s)  Dr. Berle Mull    Location  [facility]    Staff Present  Rosebud Poles, RN, BSN;Carlette Wilber Oliphant, RN, Bjorn Loser, MS, Exercise Physiologist;Lisa Ysidro Evert, RN;Molly DiVincenzo, MS, ACSM RCEP, Exercise Physiologist    Medication changes reported      No    Fall or balance concerns reported     No    Tobacco Cessation  No Change    Warm-up and Cool-down  Performed as group-led instruction    Resistance Training Performed  Yes    VAD Patient?  No    PAD/SET Patient?  No      Pain Assessment   Currently in Pain?  No/denies    Pain Score  0-No pain    Multiple Pain Sites  No       Capillary Blood Glucose: No results found for this or any previous visit (from the past 24 hour(s)).  Exercise Prescription Changes - 11/21/18 1600      Response to Exercise   Blood Pressure (Admit)  116/70    Blood Pressure (Exercise)  132/78    Blood Pressure (Exit)  108/70    Heart Rate (Admit)  80 bpm    Heart Rate (Exercise)  108 bpm    Heart Rate (Exit)  97 bpm    Oxygen Saturation (Admit)  96 %    Oxygen Saturation (Exercise)  94 %    Oxygen Saturation (Exit)  92 %    Rating of Perceived Exertion (Exercise)  14    Perceived Dyspnea (Exercise)  2    Duration  Progress to 45 minutes of aerobic exercise without signs/symptoms of physical distress    Intensity  THRR unchanged      Progression   Progression  Continue to progress workloads to maintain intensity without signs/symptoms of physical distress.      Resistance Training   Training Prescription  Yes    Weight  orange bands    Reps  10-15    Time  10 Minutes      Interval Training   Interval Training  No      Recumbant Bike   Level  2    Watts  10    Minutes  17      NuStep   Level  3    SPM  80    Minutes  17    METs  2      Track   Laps  13    Minutes  17       Social History   Tobacco Use  Smoking Status Never Smoker  Smokeless Tobacco Never Used    Goals Met:  Proper associated with RPD/PD & O2 Sat Exercise tolerated well Strength training completed today  Goals Unmet:  Not Applicable  Comments: Service time is from 1330 to 1500    Dr. Rush Farmer is Medical Director for Pulmonary Rehab at [facility].

## 2018-11-23 ENCOUNTER — Encounter (HOSPITAL_COMMUNITY): Payer: Medicare Other

## 2018-11-23 ENCOUNTER — Telehealth (HOSPITAL_COMMUNITY): Payer: Self-pay | Admitting: Internal Medicine

## 2018-11-28 ENCOUNTER — Encounter (HOSPITAL_COMMUNITY)
Admission: RE | Admit: 2018-11-28 | Discharge: 2018-11-28 | Disposition: A | Discharge status: Home / Self Care | Payer: Medicare Other | Source: Ambulatory Visit | Attending: Internal Medicine | Admitting: Internal Medicine

## 2018-11-28 DIAGNOSIS — J4550 Severe persistent asthma, uncomplicated: Secondary | ICD-10-CM | POA: Diagnosis not present

## 2018-11-29 DIAGNOSIS — D2239 Melanocytic nevi of other parts of face: Secondary | ICD-10-CM | POA: Diagnosis not present

## 2018-11-29 DIAGNOSIS — D225 Melanocytic nevi of trunk: Secondary | ICD-10-CM | POA: Diagnosis not present

## 2018-11-29 DIAGNOSIS — L814 Other melanin hyperpigmentation: Secondary | ICD-10-CM | POA: Diagnosis not present

## 2018-11-29 DIAGNOSIS — Z23 Encounter for immunization: Secondary | ICD-10-CM | POA: Diagnosis not present

## 2018-11-30 ENCOUNTER — Encounter (HOSPITAL_COMMUNITY)
Admission: RE | Admit: 2018-11-30 | Discharge: 2018-11-30 | Disposition: A | Discharge status: Home / Self Care | Payer: Medicare Other | Source: Ambulatory Visit | Attending: Internal Medicine | Admitting: Internal Medicine

## 2018-11-30 DIAGNOSIS — J4550 Severe persistent asthma, uncomplicated: Secondary | ICD-10-CM | POA: Diagnosis not present

## 2018-11-30 NOTE — Progress Notes (Signed)
Daily Session Note  Patient Details  Name: Kayla [name] MRN: [id number] Date of Birth: March 21, 1966 Referring Provider:     Pulmonary Rehab Walk Test from 10/17/2018 in [place]  Referring Provider  Dr. Melvyn Novas      Encounter Date: 11/30/2018  Check In: Session Check In - 11/30/18 1345      Check-In   Supervising physician immediately available to respond to emergencies  [facility] immediately available    Physician(s)  Dr. Verlon Au    Location  [facility]    Staff Present  Rosebud Poles, RN, BSN;Molly DiVincenzo, MS, ACSM RCEP, Exercise Physiologist;Dalton Kris Mouton, MS, Exercise Physiologist;Lisa Ysidro Evert, RN    Medication changes reported      No    Fall or balance concerns reported     No    Tobacco Cessation  No Change    Warm-up and Cool-down  Performed as group-led instruction    Resistance Training Performed  Yes    VAD Patient?  No    PAD/SET Patient?  No      Pain Assessment   Currently in Pain?  No/denies    Multiple Pain Sites  No       Capillary Blood Glucose: No results found for this or any previous visit (from the past 24 hour(s)).    Social History   Tobacco Use  Smoking Status Never Smoker  Smokeless Tobacco Never Used    Goals Met:  Proper associated with RPD/PD & O2 Sat Exercise tolerated well Strength training completed today  Goals Unmet:  Not Applicable  Comments: Service time is from 1330 to [place]    Dr. Rush Farmer is Medical Director for Pulmonary Rehab at [facility].

## 2018-12-05 ENCOUNTER — Encounter (HOSPITAL_COMMUNITY)
Admission: RE | Admit: 2018-12-05 | Discharge: 2018-12-05 | Disposition: A | Discharge status: Home / Self Care | Payer: Medicare Other | Source: Ambulatory Visit | Attending: Internal Medicine | Admitting: Internal Medicine

## 2018-12-05 VITALS — Wt 147.0 lb

## 2018-12-05 DIAGNOSIS — J4550 Severe persistent asthma, uncomplicated: Secondary | ICD-10-CM | POA: Insufficient documentation

## 2018-12-05 NOTE — Progress Notes (Signed)
Daily Session Note  Patient Details  Name: Kayla [name] MRN: [id number] Date of Birth: [birth year] Referring Provider:     Pulmonary Rehab Walk Test from 10/17/2018 in [place]  Referring Provider  Dr. Melvyn Novas      Encounter Date: 12/05/2018  Check In: Session Check In - 12/05/18 1355      Check-In   Supervising physician immediately available to respond to emergencies  [facility] immediately available    Physician(s)  Dr. Maylene Roes    Location  [facility]    Staff Present  [name] RN, BSN;Joan Leonia Reeves, RN, BSN;Lisa Ysidro Evert, RN;Olinty Scars[place], MS, ACSM CEP, Exercise Physiologist    Medication changes reported      No    Fall or balance concerns reported     No    Tobacco Cessation  No Change    Warm-up and Cool-down  Performed as group-led instruction    Resistance Training Performed  Yes    VAD Patient?  No    PAD/SET Patient?  No      Pain Assessment   Currently in Pain?  No/denies    Pain Score  0-No pain    Multiple Pain Sites  No       Capillary Blood Glucose: No results found for this or any previous visit (from the past 24 hour(s)).  Exercise Prescription Changes - 12/05/18 1500      Response to Exercise   Blood Pressure (Admit)  120/78    Blood Pressure (Exercise)  106/70    Blood Pressure (Exit)  104/70    Heart Rate (Admit)  92 bpm    Heart Rate (Exercise)  121 bpm    Heart Rate (Exit)  92 bpm    Oxygen Saturation (Admit)  95 %    Oxygen Saturation (Exercise)  91 %    Oxygen Saturation (Exit)  92 %    Rating of Perceived Exertion (Exercise)  18    Perceived Dyspnea (Exercise)  1    Duration  Progress to 45 minutes of aerobic exercise without signs/symptoms of physical distress    Intensity  THRR unchanged      Progression   Progression  Continue to progress workloads to maintain intensity without signs/symptoms of physical distress.      Resistance Training   Training Prescription  Yes    Weight   orange bands    Reps  10-15    Time  10 Minutes      Recumbant Bike   Level  2    Watts  10    Minutes  17      NuStep   Level  3    SPM  80    Minutes  17      Track   Laps  14    Minutes  17       Social History   Tobacco Use  Smoking Status Never Smoker  Smokeless Tobacco Never Used    Goals Met:  Proper associated with RPD/PD & O2 Sat Exercise tolerated well Strength training completed today  Goals Unmet:  Not Applicable  Comments: Service time is from 1330 to 1510    Dr. Rush Farmer is Medical Director for Pulmonary Rehab at [facility].

## 2018-12-07 ENCOUNTER — Encounter (HOSPITAL_COMMUNITY)
Admission: RE | Admit: 2018-12-07 | Discharge: 2018-12-07 | Disposition: A | Discharge status: Home / Self Care | Payer: Medicare Other | Source: Ambulatory Visit | Attending: Internal Medicine | Admitting: Internal Medicine

## 2018-12-07 DIAGNOSIS — J4550 Severe persistent asthma, uncomplicated: Secondary | ICD-10-CM

## 2018-12-07 NOTE — Progress Notes (Signed)
Pulmonary Individual Treatment Plan  Patient Details  Name: Kayla [name] MRN: [id number] Date of Birth: 08/29/1966 Referring Provider:     Pulmonary Rehab Walk Test from 10/17/2018 in [place]  Referring Provider  Dr. Melvyn Novas      Initial Encounter Date:    Pulmonary Rehab Walk Test from 10/17/2018 in [place]  Date  10/17/18      Visit Diagnosis: Severe persistent asthma without complication  Patient's Home Medications on Admission:   Current Outpatient Medications:  .  abatacept (ORENCIA) 250 MG injection, Inject into the vein once a week. , Disp: , Rfl:  .  beclomethasone (QVAR REDIHALER) 40 MCG/ACT inhaler, Take 2 puffs first thing in am and then another 2 puffs about 12 hours later., Disp: 1 Inhaler, Rfl: 11 .  bimatoprost (LUMIGAN) 0.01 % SOLN, As directed, Disp: , Rfl:  .  brimonidine (ALPHAGAN P) 0.1 % SOLN, Place 1 drop into the left eye 2 (two) times daily., Disp: , Rfl:  .  celecoxib (CELEBREX) 200 MG capsule, Take 200 mg by mouth daily. Take 1 tablet every other day, Disp: , Rfl:  .  mometasone-formoterol (DULERA) 200-5 MCG/ACT AERO, INHALE TWO PUFFS BY MOUTH EVERY MORNING AND INHALE TWO PUFFS BY MOUTH 12 HOURS LATER, Disp: 1 Inhaler, Rfl: 11 .  venlafaxine (EFFEXOR) 37.5 MG tablet, Take 37.5 mg by mouth daily., Disp: , Rfl:   Past Medical History: Past Medical History:  Diagnosis Date  . Asthma    daily inhaler  . Dental crowns present   . GERD (gastroesophageal reflux disease)   . Glaucoma   . History of kidney stones   . Limited joint range of motion    cervical spine and jaw - due to RA  . Rheumatoid arthritis(714.0)   . Scalp cyst 10/2016    Tobacco Use: Social History   Tobacco Use  Smoking Status Never Smoker  Smokeless Tobacco Never Used    Labs: Recent Review Flowsheet Data    Labs for ITP Cardiac and Pulmonary Rehab 04/17/2008   TCO2 27      Capillary Blood Glucose: No  results found for: GLUCAP   Pulmonary Assessment Scores: Pulmonary Assessment Scores    Row Name 10/13/18 1033         ADL UCSD   SOB Score total  46       CAT Score   CAT Score  12        Pulmonary Function Assessment: Pulmonary Function Assessment - 10/13/18 1028      Breath   Bilateral Breath Sounds  Clear    Shortness of Breath  Limiting activity   "annoyed"      Exercise Target Goals: Exercise Program Goal: Individual exercise prescription set using results from initial 6 min walk test and THRR while considering  patient's activity barriers and safety.   Exercise Prescription Goal: Initial exercise prescription builds to 30-45 minutes a day of aerobic activity, 2-3 days per week.  Home exercise guidelines will be given to patient during program as part of exercise prescription that the participant will acknowledge.  Activity Barriers & Risk Stratification: Activity Barriers & Cardiac Risk Stratification - 10/13/18 1020      Activity Barriers & Cardiac Risk Stratification   Activity Barriers  Arthritis;Right Knee Replacement;Left Knee Replacement;Right Hip Replacement;Joint Problems;Balance Concerns;Neck/Spine Problems;Shortness of Breath       6 Minute Walk: 6 Minute Walk    Row Name 10/17/18 1308  6 Minute Walk   Phase  Initial     Distance  1148 feet     Walk Time  6 minutes     # of Rest Breaks  0     MPH  2.17     METS  2.68     RPE  13     Perceived Dyspnea   1     Symptoms  Yes (comment)     Comments  neck pain 5/10, hip and lower back pain 4/10     Resting HR  85 bpm     Resting BP  106/68     Resting Oxygen Saturation   97 %     Exercise Oxygen Saturation  during 6 min walk  93 %     Max Ex. HR  111 bpm     Max Ex. BP  128/72     2 Minute Post BP  108/68       Interval HR   1 Minute HR  97     2 Minute HR  109     3 Minute HR  107     4 Minute HR  108     5 Minute HR  109     6 Minute HR  111     2 Minute Post HR  83      Interval Heart Rate?  Yes       Interval Oxygen   Interval Oxygen?  Yes     Baseline Oxygen Saturation %  97 %     1 Minute Oxygen Saturation %  93 %     1 Minute Liters of Oxygen  0 L     2 Minute Oxygen Saturation %  92 %     2 Minute Liters of Oxygen  0 L     3 Minute Oxygen Saturation %  93 %     3 Minute Liters of Oxygen  0 L     4 Minute Oxygen Saturation %  93 %     4 Minute Liters of Oxygen  0 L     5 Minute Oxygen Saturation %  94 %     5 Minute Liters of Oxygen  0 L     6 Minute Oxygen Saturation %  94 %     6 Minute Liters of Oxygen  0 L     2 Minute Post Oxygen Saturation %  96 %     2 Minute Post Liters of Oxygen  0 L        Oxygen Initial Assessment: Oxygen Initial Assessment - 11/06/18 0731      Home Oxygen   Home Oxygen Device  None    Sleep Oxygen Prescription  None    Home Exercise Oxygen Prescription  None    Home at Rest Exercise Oxygen Prescription  None       Oxygen Re-Evaluation: Oxygen Re-Evaluation    Row Name 12/05/18 0855             Program Oxygen Prescription   Program Oxygen Prescription  None         Home Oxygen   Home Oxygen Device  None       Sleep Oxygen Prescription  None       Home Exercise Oxygen Prescription  None       Home at Rest Exercise Oxygen Prescription  None          Oxygen Discharge (Final Oxygen Re-Evaluation):  Oxygen Re-Evaluation - 12/05/18 0855      Program Oxygen Prescription   Program Oxygen Prescription  None      Home Oxygen   Home Oxygen Device  None    Sleep Oxygen Prescription  None    Home Exercise Oxygen Prescription  None    Home at Rest Exercise Oxygen Prescription  None       Initial Exercise Prescription: Initial Exercise Prescription - 10/17/18 1300      Date of Initial Exercise RX and Referring Provider   Date  10/17/18    Referring Provider  Dr. Melvyn Novas      Recumbant Bike   Level  1.5    Watts  10    Minutes  17      NuStep   Level  2    SPM  80    Minutes  17       Track   Laps  10    Minutes  17      Prescription Details   Frequency (times per week)  2    Duration  Progress to 45 minutes of aerobic exercise without signs/symptoms of physical distress      Intensity   THRR 40-80% of Max Heartrate  67-134    Ratings of Perceived Exertion  11-13    Perceived Dyspnea  0-4      Progression   Progression  Continue to progress workloads to maintain intensity without signs/symptoms of physical distress.      Resistance Training   Training Prescription  Yes    Weight  orange bands    Reps  10-15       Perform Capillary Blood Glucose checks as needed.  Exercise Prescription Changes: Exercise Prescription Changes    Row Name 10/24/18 1600 11/07/18 1500 11/21/18 1600 12/05/18 1500       Response to Exercise   Blood Pressure (Admit)  118/78  110/70  116/70  120/78    Blood Pressure (Exercise)  132/70  130/70  132/78  106/70    Blood Pressure (Exit)  98/72  114/70  108/70  104/70    Heart Rate (Admit)  106 bpm  93 bpm  80 bpm  92 bpm    Heart Rate (Exercise)  133 bpm  115 bpm  108 bpm  121 bpm    Heart Rate (Exit)  107 bpm  96 bpm  97 bpm  92 bpm    Oxygen Saturation (Admit)  95 %  95 %  96 %  95 %    Oxygen Saturation (Exercise)  95 %  94 %  94 %  91 %    Oxygen Saturation (Exit)  95 %  98 %  92 %  92 %    Rating of Perceived Exertion (Exercise)  '13  17  14  18    '$ Perceived Dyspnea (Exercise)  '2  2  2  1    '$ Duration  Progress to 45 minutes of aerobic exercise without signs/symptoms of physical distress  Progress to 45 minutes of aerobic exercise without signs/symptoms of physical distress  Progress to 45 minutes of aerobic exercise without signs/symptoms of physical distress  Progress to 45 minutes of aerobic exercise without signs/symptoms of physical distress    Intensity  - 40-80% HRR  THRR unchanged  THRR unchanged  THRR unchanged      Progression   Progression  Continue to progress workloads to maintain intensity without signs/symptoms of  physical distress.  Continue to progress  workloads to maintain intensity without signs/symptoms of physical distress.  Continue to progress workloads to maintain intensity without signs/symptoms of physical distress.  Continue to progress workloads to maintain intensity without signs/symptoms of physical distress.      Resistance Training   Training Prescription  Yes  Yes  Yes  Yes    Weight  orange bands  orange bands  orange bands  orange bands    Reps  10-15  10-15  10-15  10-15    Time  10 Minutes  10 Minutes  10 Minutes  10 Minutes      Interval Training   Interval Training  No  No  No  -      Recumbant Bike   Level  '2  2  2  2    '$ Watts  '10  10  10  10    '$ Minutes  '17  17  17  17      '$ NuStep   Level  '2  2  3  3    '$ SPM  80  80  80  80    Minutes  '17  17  17  17    '$ METs  1.7  1.9  2  -      Track   Laps  -  '10  13  14    '$ Minutes  -  '17  17  17       '$ Exercise Comments:   Exercise Goals and Review: Exercise Goals    Row Name 10/13/18 1022             Exercise Goals   Increase Physical Activity  Yes       Intervention  Provide advice, education, support and counseling about physical activity/exercise needs.;Develop an individualized exercise prescription for aerobic and resistive training based on initial evaluation findings, risk stratification, comorbidities and participant's personal goals.       Expected Outcomes  Short Term: Attend rehab on a regular basis to increase amount of physical activity.;Long Term: Add in home exercise to make exercise part of routine and to increase amount of physical activity.;Long Term: Exercising regularly at least 3-5 days a week.       Increase Strength and Stamina  Yes       Intervention  Provide advice, education, support and counseling about physical activity/exercise needs.;Develop an individualized exercise prescription for aerobic and resistive training based on initial evaluation findings, risk stratification, comorbidities and  participant's personal goals.       Expected Outcomes  Short Term: Increase workloads from initial exercise prescription for resistance, speed, and METs.       Able to understand and use rate of perceived exertion (RPE) scale  Yes       Intervention  Provide education and explanation on how to use RPE scale       Expected Outcomes  Short Term: Able to use RPE daily in rehab to express subjective intensity level;Long Term:  Able to use RPE to guide intensity level when exercising independently       Able to understand and use Dyspnea scale  Yes       Intervention  Provide education and explanation on how to use Dyspnea scale       Expected Outcomes  Short Term: Able to use Dyspnea scale daily in rehab to express subjective sense of shortness of breath during exertion;Long Term: Able to use Dyspnea scale to guide intensity level when exercising independently  Knowledge and understanding of Target Heart Rate Range (THRR)  Yes       Intervention  Provide education and explanation of THRR including how the numbers were predicted and where they are located for reference       Expected Outcomes  Short Term: Able to state/look up THRR;Long Term: Able to use THRR to govern intensity when exercising independently;Short Term: Able to use daily as guideline for intensity in rehab       Understanding of Exercise Prescription  Yes       Intervention  Provide education, explanation, and written materials on patient's individual exercise prescription       Expected Outcomes  Short Term: Able to explain program exercise prescription;Long Term: Able to explain home exercise prescription to exercise independently          Exercise Goals Re-Evaluation : Exercise Goals Re-Evaluation    Row Name 11/06/18 6270 12/05/18 0853           Exercise Goal Re-Evaluation   Exercise Goals Review  Increase Physical Activity;Increase Strength and Stamina;Able to understand and use rate of perceived exertion (RPE)  scale;Able to understand and use Dyspnea scale;Knowledge and understanding of Target Heart Rate Range (THRR);Understanding of Exercise Prescription  Increase Physical Activity;Increase Strength and Stamina;Able to understand and use rate of perceived exertion (RPE) scale;Able to understand and use Dyspnea scale;Knowledge and understanding of Target Heart Rate Range (THRR);Understanding of Exercise Prescription      Comments  Patient has only completed 3 rehab sessions. Patient's MET level falls in a low level. Will continue to monitor patient and progress as able.  Patient is progressing well in program. She is motivated to make lifestyle chances and lose weight. She is able to walk 15 laps (200 ft) in 15 minutes. Her MET level average places her in a low level. Will continue to monitor patient and emphasize the importance of home exercise.      Expected Outcomes  Through exercise at rehab, patient will increase physical capacity, decrease shortness of breath, and feel confident in implementing and exercise routine at home. Also, when patient completes their graduation 6MWT they will increase their walk test distance by atleast 100 feet.   Through exercise at rehab, patient will increase physical capacity, decrease shortness of breath, and feel confident in implementing and exercise routine at home. Also, when patient completes their graduation 6MWT they will increase their walk test distance by atleast 100 feet.          Discharge Exercise Prescription (Final Exercise Prescription Changes): Exercise Prescription Changes - 12/05/18 1500      Response to Exercise   Blood Pressure (Admit)  120/78    Blood Pressure (Exercise)  106/70    Blood Pressure (Exit)  104/70    Heart Rate (Admit)  92 bpm    Heart Rate (Exercise)  121 bpm    Heart Rate (Exit)  92 bpm    Oxygen Saturation (Admit)  95 %    Oxygen Saturation (Exercise)  91 %    Oxygen Saturation (Exit)  92 %    Rating of Perceived Exertion  (Exercise)  18    Perceived Dyspnea (Exercise)  1    Duration  Progress to 45 minutes of aerobic exercise without signs/symptoms of physical distress    Intensity  THRR unchanged      Progression   Progression  Continue to progress workloads to maintain intensity without signs/symptoms of physical distress.      Resistance Training  Training Prescription  Yes    Weight  orange bands    Reps  10-15    Time  10 Minutes      Recumbant Bike   Level  2    Watts  10    Minutes  17      NuStep   Level  3    SPM  80    Minutes  17      Track   Laps  14    Minutes  17       Nutrition:  Target Goals: Understanding of nutrition guidelines, daily intake of sodium '1500mg'$ , cholesterol '200mg'$ , calories 30% from fat and 7% or less from saturated fats, daily to have 5 or more servings of fruits and vegetables.  Biometrics: Pre Biometrics - 10/13/18 1021      Pre Biometrics   Grip Strength  15 kg        Nutrition Therapy Plan and Nutrition Goals: Nutrition Therapy & Goals - 11/21/18 1457      Nutrition Therapy   Diet  general healthful      Personal Nutrition Goals   Nutrition Goal  Pt to build a healthy plate including fruits, vegetables, whole grains, lean protein, healthy fat and low fat dairy    Personal Goal #2  Weigh and measure portions for accuracy    Personal Goal #3  Practice mindful eating exercises    Personal Goal #4  Identify food quantities necessary to achieve wt loss of  -2# per week to a goal wt loss of 6-24 lb at graduation from pulmonary rehab      [place], educate and counsel regarding individualized specific dietary modifications aiming towards targeted core components such as weight, hypertension, lipid management, diabetes, heart failure and other comorbidities.    Expected Outcomes  Short Term Goal: Understand basic principles of dietary content, such as calories, fat, sodium, cholesterol and nutrients.;Long Term  Goal: Adherence to prescribed nutrition plan.       Nutrition Assessments: Nutrition Assessments - 11/17/18 0859      MEDFICTS Scores   Pre Score  54       Nutrition Goals Re-Evaluation:   Nutrition Goals Discharge (Final Nutrition Goals Re-Evaluation):   Psychosocial: Target Goals: Acknowledge presence or absence of significant depression and/or stress, maximize coping skills, provide positive support system. Participant is able to verbalize types and ability to use techniques and skills needed for reducing stress and depression.  Initial Review & Psychosocial Screening: Initial Psych Review & Screening - 10/13/18 1047      Initial Review   Current issues with  History of Depression;Current Anxiety/Panic      Family Dynamics   Good Support System?  Yes      Barriers   Psychosocial barriers to participate in program  The patient should benefit from training in stress management and relaxation.      Screening Interventions   Interventions  Encouraged to exercise    Expected Outcomes  Long Term Goal: Stressors or current issues are controlled or eliminated.;Short Term goal: Utilizing psychosocial counselor, staff and physician to assist with identification of specific Stressors or current issues interfering with healing process. Setting desired goal for each stressor or current issue identified.       Quality of Life Scores:  Scores of 19 and below usually indicate a poorer quality of life in these areas.  A difference of  2-3 points is a clinically meaningful difference.  A  difference of 2-3 points in the total score of the Quality of Life Index has been associated with significant improvement in overall quality of life, self-image, physical symptoms, and general health in studies assessing change in quality of life.  PHQ-9: Recent Review Flowsheet Data    Depression screen [facility] 2/9 10/13/2018   Decreased Interest 0   Down, Depressed, Hopeless 0   PHQ - 2 Score 0    Altered sleeping 0   Tired, decreased energy 3   Change in appetite 3   Feeling bad or failure about yourself  0   Trouble concentrating 0   Moving slowly or fidgety/restless 0   Suicidal thoughts 0   PHQ-9 Score 6   Difficult doing work/chores Not difficult at all     Interpretation of Total Score  Total Score Depression Severity:  1-4 = Minimal depression, 5-9 = Mild depression, 10-14 = Moderate depression, 15-19 = Moderately severe depression, 20-27 = Severe depression   Psychosocial Evaluation and Intervention: Psychosocial Evaluation - 10/13/18 1050      Psychosocial Evaluation & Interventions   Interventions  Stress management education;Relaxation education;Encouraged to exercise with the program and follow exercise prescription    Comments  Pt is on Effexor and has successful therapy. Pt has a history of depression, but states she feels it is well controlled. She states that she has stress related to health "storms" where she feels "old" and "discouraged. She states she is hopeful    Expected Outcomes  pt will continue to remain hopeful and maintain a positive outlook    Continue Psychosocial Services   Follow up required by staff       Psychosocial Re-Evaluation: Psychosocial Re-Evaluation    [place] Name 11/07/18 1614 12/04/18 1354           Psychosocial Re-Evaluation   Current issues with  History of Depression;Current Stress Concerns  History of Depression;Current Stress Concerns Teenage daughter is a stressor      Comments  Patient had verbal outbursts with hostility in the first 2 exercise sessions, she was counselled that this is not appropriate behavior and if it happened again she would be discharged from the program.  Her demeanor and attitude have been positive after discussing verbal outburst the first 2 exercise sessions.      Expected Outcomes  Appropriate behavior while in pulmonary rehab  Appropriate behavior while in pulmonary rehab      Interventions   Encouraged to attend Pulmonary Rehabilitation for the exercise;Relaxation education;Stress management education  Encouraged to attend Pulmonary Rehabilitation for the exercise;Relaxation education;Stress management education      Continue Psychosocial Services   Follow up required by staff  Follow up required by staff      Comments  Confided that her teenage daughter causes much stress in her life and she has difficulty dealing with chronic illness  Is enjoying the benefits of exercise, wants to graduate at the end of March d/t hectic work schedule and juggling exercising in pulmonary rehab.  Will exercise at the [facility] after graduation while her daughter plays volleyball.        Initial Review   Source of Stress Concerns  Chronic Illness;Family  Chronic Illness;Family         Psychosocial Discharge (Final Psychosocial Re-Evaluation): Psychosocial Re-Evaluation - 12/04/18 1354      Psychosocial Re-Evaluation   Current issues with  History of Depression;Current Stress Concerns   Teenage daughter is a stressor   Comments  Her demeanor and attitude have  been positive after discussing verbal outburst the first 2 exercise sessions.    Expected Outcomes  Appropriate behavior while in pulmonary rehab    Interventions  Encouraged to attend Pulmonary Rehabilitation for the exercise;Relaxation education;Stress management education    Continue Psychosocial Services   Follow up required by staff    Comments  Is enjoying the benefits of exercise, wants to graduate at the end of March d/t hectic work schedule and juggling exercising in pulmonary rehab.  Will exercise at the [facility] after graduation while her daughter plays volleyball.      Initial Review   Source of Stress Concerns  Chronic Illness;Family       Education: Education Goals: Education classes will be provided on a weekly basis, covering required topics. Participant will state understanding/return demonstration of topics presented.  Learning  Barriers/Preferences:   Education Topics: Risk Factor Reduction:  -Group instruction that is supported by a PowerPoint presentation. Instructor discusses the definition of a risk factor, different risk factors for pulmonary disease, and how the heart and lungs work together.     Nutrition for Pulmonary Patient:  -Group instruction provided by PowerPoint slides, verbal discussion, and written materials to support subject matter. The instructor gives an explanation and review of healthy diet recommendations, which includes a discussion on weight management, recommendations for fruit and vegetable consumption, as well as protein, fluid, caffeine, fiber, sodium, sugar, and alcohol. Tips for eating when patients are short of breath are discussed.   Pursed Lip Breathing:  -Group instruction that is supported by demonstration and informational handouts. Instructor discusses the benefits of pursed lip and diaphragmatic breathing and detailed demonstration on how to preform both.     Oxygen Safety:  -Group instruction provided by PowerPoint, verbal discussion, and written material to support subject matter. There is an overview of "What is Oxygen" and "Why do we need it".  Instructor also reviews how to create a safe environment for oxygen use, the importance of using oxygen as prescribed, and the risks of noncompliance. There is a brief discussion on traveling with oxygen and resources the patient may utilize.   Oxygen Equipment:  -Group instruction provided by [facility] Staff utilizing handouts, written materials, and equipment demonstrations.   PULMONARY REHAB OTHER RESPIRATORY from 11/30/2018 in [place]  Date  11/16/18  Educator  Ace Gins Rep  Instruction Review Code  1- Verbalizes Understanding      Signs and Symptoms:  -Group instruction provided by written material and verbal discussion to support subject matter. Warning signs and symptoms of infection,  stroke, and heart attack are reviewed and when to call the physician/911 reinforced. Tips for preventing the spread of infection discussed.   PULMONARY REHAB OTHER RESPIRATORY from 11/30/2018 in [place]  Date  11/30/18  Educator  [name]  Instruction Review Code  1- Verbalizes Understanding      Advanced Directives:  -Group instruction provided by verbal instruction and written material to support subject matter. Instructor reviews Advanced Directive laws and proper instruction for filling out document.   Pulmonary Video:  -Group video education that reviews the importance of medication and oxygen compliance, exercise, good nutrition, pulmonary hygiene, and pursed lip and diaphragmatic breathing for the pulmonary patient.   Exercise for the Pulmonary Patient:  -Group instruction that is supported by a PowerPoint presentation. Instructor discusses benefits of exercise, core components of exercise, frequency, duration, and intensity of an exercise routine, importance of utilizing pulse oximetry during exercise, safety  while exercising, and options of places to exercise outside of rehab.     Pulmonary Medications:  -Verbally interactive group education provided by instructor with focus on inhaled medications and proper administration.   PULMONARY REHAB OTHER RESPIRATORY from 11/30/2018 in [facility]  Date  10/24/18  Educator  Pharmacist  Instruction Review Code  2- Demonstrated Understanding      Anatomy and Physiology of the Respiratory System and Intimacy:  -Group instruction provided by PowerPoint, verbal discussion, and written material to support subject matter. Instructor reviews respiratory cycle and anatomical components of the respiratory system and their functions. Instructor also reviews differences in obstructive and restrictive respiratory diseases with examples of each. Intimacy, Sex, and Sexuality differences are  reviewed with a discussion on how relationships can change when diagnosed with pulmonary disease. Common sexual concerns are reviewed.   PULMONARY REHAB OTHER RESPIRATORY from 11/30/2018 in [facility]  Date  11/02/18  Educator  RN  Instruction Review Code  1- Verbalizes Understanding      MD DAY -A group question and answer session with a medical doctor that allows participants to ask questions that relate to their pulmonary disease state.   OTHER EDUCATION -Group or individual verbal, written, or video instructions that support the educational goals of the pulmonary rehab program.   Holiday Eating Survival Tips:  -Group instruction provided by PowerPoint slides, verbal discussion, and written materials to support subject matter. The instructor gives patients tips, tricks, and techniques to help them not only survive but enjoy the holidays despite the onslaught of food that accompanies the holidays.   Knowledge Questionnaire Score: Knowledge Questionnaire Score - 10/13/18 1043      Knowledge Questionnaire Score   Pre Score  12/18       Core Components/Risk Factors/Patient Goals at Admission: Personal Goals and Risk Factors at Admission - 10/13/18 1053      Core Components/Risk Factors/Patient Goals on Admission    Weight Management  Weight Loss;Yes    Intervention  Weight Management: Develop a combined nutrition and exercise program designed to reach desired caloric intake, while maintaining appropriate intake of nutrient and fiber, sodium and fats, and appropriate energy expenditure required for the weight goal.    Admit Weight  151 lb 10.8 oz (68.8 kg)    Goal Weight: Short Term  141 lb (64 kg)    Expected Outcomes  Short Term: Continue to assess and modify interventions until short term weight is achieved;Long Term: Adherence to nutrition and physical activity/exercise program aimed toward attainment of established weight goal    Improve  shortness of breath with ADL's  Yes    Intervention  Provide education, individualized exercise plan and daily activity instruction to help decrease symptoms of SOB with activities of daily living.    Expected Outcomes  Short Term: Improve cardiorespiratory fitness to achieve a reduction of symptoms when performing ADLs;Long Term: Be able to perform more ADLs without symptoms or delay the onset of symptoms    Stress  Yes    Intervention  Offer individual and/or small group education and counseling on adjustment to heart disease, stress management and health-related lifestyle change. Teach and support self-help strategies.;Refer participants experiencing significant psychosocial distress to appropriate mental health specialists for further evaluation and treatment. When possible, include family members and significant others in education/counseling sessions.    Expected Outcomes  Short Term: Participant demonstrates changes in health-related behavior, relaxation and other stress management skills, ability to obtain  effective social support, and compliance with psychotropic medications if prescribed.;Long Term: Emotional wellbeing is indicated by absence of clinically significant psychosocial distress or social isolation.       Core Components/Risk Factors/Patient Goals Review:  Goals and Risk Factor Review    Row Name 10/13/18 1055 11/07/18 1620 12/04/18 1359         Core Components/Risk Factors/Patient Goals Review   Personal Goals Review  Weight Management/Obesity;Improve shortness of breath with ADL's;Develop more efficient breathing techniques such as purse lipped breathing and diaphragmatic breathing and practicing self-pacing with activity.;Increase knowledge of respiratory medications and ability to use respiratory devices properly.;Stress  Weight Management/Obesity;Improve shortness of breath with ADL's;Stress  Weight Management/Obesity;Improve shortness of breath with ADL's;Increase knowledge  of respiratory medications and ability to use respiratory devices properly.;Develop more efficient breathing techniques such as purse lipped breathing and diaphragmatic breathing and practicing self-pacing with activity.     Review  -  Just started program, too early to have met any goals, the last 3 sessions her mood was positive and in control, seems to be enjoying the program.  Has lost 2 kg and keeping it off, walking 15 laps on the track, and level 2 on recumbent bike, and level 3 on nustep.  Has attended 10 exercis sessions, she is working hard while exercicsing     Expected Outcomes  -  See admission goals  See admission goals        Core Components/Risk Factors/Patient Goals at Discharge (Final Review):  Goals and Risk Factor Review - 12/04/18 1359      Core Components/Risk Factors/Patient Goals Review   Personal Goals Review  Weight Management/Obesity;Improve shortness of breath with ADL's;Increase knowledge of respiratory medications and ability to use respiratory devices properly.;Develop more efficient breathing techniques such as purse lipped breathing and diaphragmatic breathing and practicing self-pacing with activity.    Review  Has lost 2 kg and keeping it off, walking 15 laps on the track, and level 2 on recumbent bike, and level 3 on nustep.  Has attended 10 exercis sessions, she is working hard while exercicsing    Expected Outcomes  See admission goals       ITP Comments: ITP Comments    Row Name 10/13/18 0956           ITP Comments  Dr. Jennet Maduro, medical director, Pulmonary Rehab          Comments: ITP REVIEW Pt is making expected progress toward pulmonary rehab goals after completing 11 sessions. Recommend continued exercise, life style modification, education, and utilization of breathing techniques to increase stamina and strength and decrease shortness of breath with exertion.

## 2018-12-12 ENCOUNTER — Telehealth (HOSPITAL_COMMUNITY): Payer: Self-pay | Admitting: Internal Medicine

## 2018-12-12 ENCOUNTER — Encounter (HOSPITAL_COMMUNITY): Payer: Medicare Other

## 2018-12-13 ENCOUNTER — Ambulatory Visit: Payer: Medicare Other | Admitting: Internal Medicine

## 2018-12-14 ENCOUNTER — Encounter (HOSPITAL_COMMUNITY): Payer: Medicare Other

## 2018-12-19 ENCOUNTER — Encounter (HOSPITAL_COMMUNITY): Payer: Medicare Other

## 2018-12-21 ENCOUNTER — Encounter (HOSPITAL_COMMUNITY): Payer: Medicare Other

## 2018-12-25 ENCOUNTER — Telehealth (HOSPITAL_COMMUNITY): Payer: Self-pay

## 2018-12-25 NOTE — Addendum Note (Signed)
Encounter addended by: Gaspar Bidding on: 12/25/2018 7:41 AM  Actions taken: Flowsheet data copied forward, Visit Navigator Flowsheet section accepted

## 2018-12-26 ENCOUNTER — Encounter (HOSPITAL_COMMUNITY): Payer: Medicare Other

## 2018-12-26 NOTE — Progress Notes (Signed)
Pulmonary Individual Treatment Plan  Patient Details  Name: Kayla [name] MRN: [id number] Date of Birth: 1966-08-22 Referring Provider:     Pulmonary Rehab Walk Test from 10/17/2018 in [place]  Referring Provider  Dr. Melvyn Novas      Initial Encounter Date:    Pulmonary Rehab Walk Test from 10/17/2018 in [place]  Date  10/17/18      Visit Diagnosis: Severe persistent asthma without complication  Patient's Home Medications on Admission:   Current Outpatient Medications:  .  abatacept (ORENCIA) 250 MG injection, Inject into the vein once a week. , Disp: , Rfl:  .  beclomethasone (QVAR REDIHALER) 40 MCG/ACT inhaler, Take 2 puffs first thing in am and then another 2 puffs about 12 hours later., Disp: 1 Inhaler, Rfl: 11 .  bimatoprost (LUMIGAN) 0.01 % SOLN, As directed, Disp: , Rfl:  .  brimonidine (ALPHAGAN P) 0.1 % SOLN, Place 1 drop into the left eye 2 (two) times daily., Disp: , Rfl:  .  celecoxib (CELEBREX) 200 MG capsule, Take 200 mg by mouth daily. Take 1 tablet every other day, Disp: , Rfl:  .  mometasone-formoterol (DULERA) 200-5 MCG/ACT AERO, INHALE TWO PUFFS BY MOUTH EVERY MORNING AND INHALE TWO PUFFS BY MOUTH 12 HOURS LATER, Disp: 1 Inhaler, Rfl: 11 .  venlafaxine (EFFEXOR) 37.5 MG tablet, Take 37.5 mg by mouth daily., Disp: , Rfl:   Past Medical History: Past Medical History:  Diagnosis Date  . Asthma    daily inhaler  . Dental crowns present   . GERD (gastroesophageal reflux disease)   . Glaucoma   . History of kidney stones   . Limited joint range of motion    cervical spine and jaw - due to RA  . Rheumatoid arthritis(714.0)   . Scalp cyst 10/2016    Tobacco Use: Social History   Tobacco Use  Smoking Status Never Smoker  Smokeless Tobacco Never Used    Labs: Recent Review Flowsheet Data    Labs for ITP Cardiac and Pulmonary Rehab 04/17/2008   TCO2 27      Capillary Blood Glucose: No  results found for: GLUCAP   Pulmonary Assessment Scores: Pulmonary Assessment Scores    Row Name 10/13/18 1033         ADL UCSD   SOB Score total  46       CAT Score   CAT Score  12        Pulmonary Function Assessment: Pulmonary Function Assessment - 10/13/18 1028      Breath   Bilateral Breath Sounds  Clear    Shortness of Breath  Limiting activity   "annoyed"      Exercise Target Goals: Exercise Program Goal: Individual exercise prescription set using results from initial 6 min walk test and THRR while considering  patient's activity barriers and safety.   Exercise Prescription Goal: Initial exercise prescription builds to 30-45 minutes a day of aerobic activity, 2-3 days per week.  Home exercise guidelines will be given to patient during program as part of exercise prescription that the participant will acknowledge.  Activity Barriers & Risk Stratification: Activity Barriers & Cardiac Risk Stratification - 10/13/18 1020      Activity Barriers & Cardiac Risk Stratification   Activity Barriers  Arthritis;Right Knee Replacement;Left Knee Replacement;Right Hip Replacement;Joint Problems;Balance Concerns;Neck/Spine Problems;Shortness of Breath       6 Minute Walk: 6 Minute Walk    Row Name 10/17/18 1308  6 Minute Walk   Phase  Initial     Distance  1148 feet     Walk Time  6 minutes     # of Rest Breaks  0     MPH  2.17     METS  2.68     RPE  13     Perceived Dyspnea   1     Symptoms  Yes (comment)     Comments  neck pain 5/10, hip and lower back pain 4/10     Resting HR  85 bpm     Resting BP  106/68     Resting Oxygen Saturation   97 %     Exercise Oxygen Saturation  during 6 min walk  93 %     Max Ex. HR  111 bpm     Max Ex. BP  128/72     2 Minute Post BP  108/68       Interval HR   1 Minute HR  97     2 Minute HR  109     3 Minute HR  107     4 Minute HR  108     5 Minute HR  109     6 Minute HR  111     2 Minute Post HR  83      Interval Heart Rate?  Yes       Interval Oxygen   Interval Oxygen?  Yes     Baseline Oxygen Saturation %  97 %     1 Minute Oxygen Saturation %  93 %     1 Minute Liters of Oxygen  0 L     2 Minute Oxygen Saturation %  92 %     2 Minute Liters of Oxygen  0 L     3 Minute Oxygen Saturation %  93 %     3 Minute Liters of Oxygen  0 L     4 Minute Oxygen Saturation %  93 %     4 Minute Liters of Oxygen  0 L     5 Minute Oxygen Saturation %  94 %     5 Minute Liters of Oxygen  0 L     6 Minute Oxygen Saturation %  94 %     6 Minute Liters of Oxygen  0 L     2 Minute Post Oxygen Saturation %  96 %     2 Minute Post Liters of Oxygen  0 L        Oxygen Initial Assessment: Oxygen Initial Assessment - 11/06/18 0731      Home Oxygen   Home Oxygen Device  None    Sleep Oxygen Prescription  None    Home Exercise Oxygen Prescription  None    Home at Rest Exercise Oxygen Prescription  None       Oxygen Re-Evaluation: Oxygen Re-Evaluation    Row Name 12/05/18 0855 12/25/18 0740           Program Oxygen Prescription   Program Oxygen Prescription  None  None        Home Oxygen   Home Oxygen Device  None  None      Sleep Oxygen Prescription  None  None      Home Exercise Oxygen Prescription  None  None      Home at Rest Exercise Oxygen Prescription  None  None  Oxygen Discharge (Final Oxygen Re-Evaluation): Oxygen Re-Evaluation - 12/25/18 0740      Program Oxygen Prescription   Program Oxygen Prescription  None      Home Oxygen   Home Oxygen Device  None    Sleep Oxygen Prescription  None    Home Exercise Oxygen Prescription  None    Home at Rest Exercise Oxygen Prescription  None       Initial Exercise Prescription: Initial Exercise Prescription - 10/17/18 1300      Date of Initial Exercise RX and Referring Provider   Date  10/17/18    Referring Provider  Dr. Melvyn Novas      Recumbant Bike   Level  1.5    Watts  10    Minutes  17      NuStep   Level  2     SPM  80    Minutes  17      Track   Laps  10    Minutes  17      Prescription Details   Frequency (times per week)  2    Duration  Progress to 45 minutes of aerobic exercise without signs/symptoms of physical distress      Intensity   THRR 40-80% of Max Heartrate  67-134    Ratings of Perceived Exertion  11-13    Perceived Dyspnea  0-4      Progression   Progression  Continue to progress workloads to maintain intensity without signs/symptoms of physical distress.      Resistance Training   Training Prescription  Yes    Weight  orange bands    Reps  10-15       Perform Capillary Blood Glucose checks as needed.  Exercise Prescription Changes: Exercise Prescription Changes    Row Name 10/24/18 1600 11/07/18 1500 11/21/18 1600 12/05/18 1500       Response to Exercise   Blood Pressure (Admit)  118/78  110/70  116/70  120/78    Blood Pressure (Exercise)  132/70  130/70  132/78  106/70    Blood Pressure (Exit)  98/72  114/70  108/70  104/70    Heart Rate (Admit)  106 bpm  93 bpm  80 bpm  92 bpm    Heart Rate (Exercise)  133 bpm  115 bpm  108 bpm  121 bpm    Heart Rate (Exit)  107 bpm  96 bpm  97 bpm  92 bpm    Oxygen Saturation (Admit)  95 %  95 %  96 %  95 %    Oxygen Saturation (Exercise)  95 %  94 %  94 %  91 %    Oxygen Saturation (Exit)  95 %  98 %  92 %  92 %    Rating of Perceived Exertion (Exercise)  '13  17  14  18    '$ Perceived Dyspnea (Exercise)  '2  2  2  1    '$ Duration  Progress to 45 minutes of aerobic exercise without signs/symptoms of physical distress  Progress to 45 minutes of aerobic exercise without signs/symptoms of physical distress  Progress to 45 minutes of aerobic exercise without signs/symptoms of physical distress  Progress to 45 minutes of aerobic exercise without signs/symptoms of physical distress    Intensity  - 40-80% HRR  THRR unchanged  THRR unchanged  THRR unchanged      Progression   Progression  Continue to progress workloads to maintain  intensity without signs/symptoms of physical  distress.  Continue to progress workloads to maintain intensity without signs/symptoms of physical distress.  Continue to progress workloads to maintain intensity without signs/symptoms of physical distress.  Continue to progress workloads to maintain intensity without signs/symptoms of physical distress.      Resistance Training   Training Prescription  Yes  Yes  Yes  Yes    Weight  orange bands  orange bands  orange bands  orange bands    Reps  10-15  10-15  10-15  10-15    Time  10 Minutes  10 Minutes  10 Minutes  10 Minutes      Interval Training   Interval Training  No  No  No  -      Recumbant Bike   Level  '2  2  2  2    '$ Watts  '10  10  10  10    '$ Minutes  '17  17  17  17      '$ NuStep   Level  '2  2  3  3    '$ SPM  80  80  80  80    Minutes  '17  17  17  17    '$ METs  1.7  1.9  2  -      Track   Laps  -  '10  13  14    '$ Minutes  -  '17  17  17       '$ Exercise Comments:   Exercise Goals and Review: Exercise Goals    Row Name 10/13/18 1022             Exercise Goals   Increase Physical Activity  Yes       Intervention  Provide advice, education, support and counseling about physical activity/exercise needs.;Develop an individualized exercise prescription for aerobic and resistive training based on initial evaluation findings, risk stratification, comorbidities and participant's personal goals.       Expected Outcomes  Short Term: Attend rehab on a regular basis to increase amount of physical activity.;Long Term: Add in home exercise to make exercise part of routine and to increase amount of physical activity.;Long Term: Exercising regularly at least 3-5 days a week.       Increase Strength and Stamina  Yes       Intervention  Provide advice, education, support and counseling about physical activity/exercise needs.;Develop an individualized exercise prescription for aerobic and resistive training based on initial evaluation findings, risk  stratification, comorbidities and participant's personal goals.       Expected Outcomes  Short Term: Increase workloads from initial exercise prescription for resistance, speed, and METs.       Able to understand and use rate of perceived exertion (RPE) scale  Yes       Intervention  Provide education and explanation on how to use RPE scale       Expected Outcomes  Short Term: Able to use RPE daily in rehab to express subjective intensity level;Long Term:  Able to use RPE to guide intensity level when exercising independently       Able to understand and use Dyspnea scale  Yes       Intervention  Provide education and explanation on how to use Dyspnea scale       Expected Outcomes  Short Term: Able to use Dyspnea scale daily in rehab to express subjective sense of shortness of breath during exertion;Long Term: Able to use Dyspnea scale to guide intensity level when exercising  independently       Knowledge and understanding of Target Heart Rate Range (THRR)  Yes       Intervention  Provide education and explanation of THRR including how the numbers were predicted and where they are located for reference       Expected Outcomes  Short Term: Able to state/look up THRR;Long Term: Able to use THRR to govern intensity when exercising independently;Short Term: Able to use daily as guideline for intensity in rehab       Understanding of Exercise Prescription  Yes       Intervention  Provide education, explanation, and written materials on patient's individual exercise prescription       Expected Outcomes  Short Term: Able to explain program exercise prescription;Long Term: Able to explain home exercise prescription to exercise independently          Exercise Goals Re-Evaluation : Exercise Goals Re-Evaluation    Row Name 11/06/18 8588 12/05/18 0853 12/25/18 0740         Exercise Goal Re-Evaluation   Exercise Goals Review  Increase Physical Activity;Increase Strength and Stamina;Able to understand and  use rate of perceived exertion (RPE) scale;Able to understand and use Dyspnea scale;Knowledge and understanding of Target Heart Rate Range (THRR);Understanding of Exercise Prescription  Increase Physical Activity;Increase Strength and Stamina;Able to understand and use rate of perceived exertion (RPE) scale;Able to understand and use Dyspnea scale;Knowledge and understanding of Target Heart Rate Range (THRR);Understanding of Exercise Prescription  -     Comments  Patient has only completed 3 rehab sessions. Patient's MET level falls in a low level. Will continue to monitor patient and progress as able.  Patient is progressing well in program. She is motivated to make lifestyle chances and lose weight. She is able to walk 15 laps (200 ft) in 15 minutes. Her MET level average places her in a low level. Will continue to monitor patient and emphasize the importance of home exercise.  Pulmonary Rehab is closed (starting 12/18/18) until further notice due to the [facility] Virus.      Expected Outcomes  Through exercise at rehab, patient will increase physical capacity, decrease shortness of breath, and feel confident in implementing and exercise routine at home. Also, when patient completes their graduation 6MWT they will increase their walk test distance by atleast 100 feet.   Through exercise at rehab, patient will increase physical capacity, decrease shortness of breath, and feel confident in implementing and exercise routine at home. Also, when patient completes their graduation 6MWT they will increase their walk test distance by atleast 100 feet.   -        Discharge Exercise Prescription (Final Exercise Prescription Changes): Exercise Prescription Changes - 12/05/18 1500      Response to Exercise   Blood Pressure (Admit)  120/78    Blood Pressure (Exercise)  106/70    Blood Pressure (Exit)  104/70    Heart Rate (Admit)  92 bpm    Heart Rate (Exercise)  121 bpm    Heart Rate (Exit)  92 bpm    Oxygen  Saturation (Admit)  95 %    Oxygen Saturation (Exercise)  91 %    Oxygen Saturation (Exit)  92 %    Rating of Perceived Exertion (Exercise)  18    Perceived Dyspnea (Exercise)  1    Duration  Progress to 45 minutes of aerobic exercise without signs/symptoms of physical distress    Intensity  THRR unchanged      Progression  Progression  Continue to progress workloads to maintain intensity without signs/symptoms of physical distress.      Resistance Training   Training Prescription  Yes    Weight  orange bands    Reps  10-15    Time  10 Minutes      Recumbant Bike   Level  2    Watts  10    Minutes  17      NuStep   Level  3    SPM  80    Minutes  17      Track   Laps  14    Minutes  17       Nutrition:  Target Goals: Understanding of nutrition guidelines, daily intake of sodium '1500mg'$ , cholesterol '200mg'$ , calories 30% from fat and 7% or less from saturated fats, daily to have 5 or more servings of fruits and vegetables.  Biometrics: Pre Biometrics - 10/13/18 1021      Pre Biometrics   Grip Strength  15 kg        Nutrition Therapy Plan and Nutrition Goals: Nutrition Therapy & Goals - 11/21/18 1457      Nutrition Therapy   Diet  general healthful      Personal Nutrition Goals   Nutrition Goal  Pt to build a healthy plate including fruits, vegetables, whole grains, lean protein, healthy fat and low fat dairy    Personal Goal #2  Weigh and measure portions for accuracy    Personal Goal #3  Practice mindful eating exercises    Personal Goal #4  Identify food quantities necessary to achieve wt loss of  -2# per week to a goal wt loss of 6-24 lb at graduation from pulmonary rehab      [place], educate and counsel regarding individualized specific dietary modifications aiming towards targeted core components such as weight, hypertension, lipid management, diabetes, heart failure and other comorbidities.    Expected Outcomes   Short Term Goal: Understand basic principles of dietary content, such as calories, fat, sodium, cholesterol and nutrients.;Long Term Goal: Adherence to prescribed nutrition plan.       Nutrition Assessments: Nutrition Assessments - 11/17/18 0859      MEDFICTS Scores   Pre Score  54       Nutrition Goals Re-Evaluation:   Nutrition Goals Discharge (Final Nutrition Goals Re-Evaluation):   Psychosocial: Target Goals: Acknowledge presence or absence of significant depression and/or stress, maximize coping skills, provide positive support system. Participant is able to verbalize types and ability to use techniques and skills needed for reducing stress and depression.  Initial Review & Psychosocial Screening: Initial Psych Review & Screening - 10/13/18 1047      Initial Review   Current issues with  History of Depression;Current Anxiety/Panic      Family Dynamics   Good Support System?  Yes      Barriers   Psychosocial barriers to participate in program  The patient should benefit from training in stress management and relaxation.      Screening Interventions   Interventions  Encouraged to exercise    Expected Outcomes  Long Term Goal: Stressors or current issues are controlled or eliminated.;Short Term goal: Utilizing psychosocial counselor, staff and physician to assist with identification of specific Stressors or current issues interfering with healing process. Setting desired goal for each stressor or current issue identified.       Quality of Life Scores:  Scores of 19 and below usually  indicate a poorer quality of life in these areas.  A difference of  2-3 points is a clinically meaningful difference.  A difference of 2-3 points in the total score of the Quality of Life Index has been associated with significant improvement in overall quality of life, self-image, physical symptoms, and general health in studies assessing change in quality of life.  PHQ-9: Recent Review  Flowsheet Data    Depression screen [employer] 2/9 10/13/2018   Decreased Interest 0   Down, Depressed, Hopeless 0   PHQ - 2 Score 0   Altered sleeping 0   Tired, decreased energy 3   Change in appetite 3   Feeling bad or failure about yourself  0   Trouble concentrating 0   Moving slowly or fidgety/restless 0   Suicidal thoughts 0   PHQ-9 Score 6   Difficult doing work/chores Not difficult at all     Interpretation of Total Score  Total Score Depression Severity:  1-4 = Minimal depression, 5-9 = Mild depression, 10-14 = Moderate depression, 15-19 = Moderately severe depression, 20-27 = Severe depression   Psychosocial Evaluation and Intervention: Psychosocial Evaluation - 10/13/18 1050      Psychosocial Evaluation & Interventions   Interventions  Stress management education;Relaxation education;Encouraged to exercise with the program and follow exercise prescription    Comments  Pt is on Effexor and has successful therapy. Pt has a history of depression, but states she feels it is well controlled. She states that she has stress related to health "storms" where she feels "old" and "discouraged. She states she is hopeful    Expected Outcomes  pt will continue to remain hopeful and maintain a positive outlook    Continue Psychosocial Services   Follow up required by staff       Psychosocial Re-Evaluation: Psychosocial Re-Evaluation    [place] Name 11/07/18 1614 12/04/18 1354 12/26/18 1523         Psychosocial Re-Evaluation   Current issues with  History of Depression;Current Stress Concerns  History of Depression;Current Stress Concerns Teenage daughter is a stressor  History of Depression;Current Stress Concerns     Comments  Patient had verbal outbursts with hostility in the first 2 exercise sessions, she was counselled that this is not appropriate behavior and if it happened again she would be discharged from the program.  Her demeanor and attitude have been positive after discussing  verbal outburst the first 2 exercise sessions.  Remains positive and works hard while exercising in pulmonary rehab     Expected Outcomes  Appropriate behavior while in pulmonary rehab  Appropriate behavior while in pulmonary rehab  No psychosocial barriers to participation in pulmonary rehab     Interventions  Encouraged to attend Pulmonary Rehabilitation for the exercise;Relaxation education;Stress management education  Encouraged to attend Pulmonary Rehabilitation for the exercise;Relaxation education;Stress management education  Encouraged to attend Pulmonary Rehabilitation for the exercise;Relaxation education;Stress management education     Continue Psychosocial Services   Follow up required by staff  Follow up required by staff  Follow up required by staff     Comments  Confided that her teenage daughter causes much stress in her life and she has difficulty dealing with chronic illness  Is enjoying the benefits of exercise, wants to graduate at the end of March d/t hectic work schedule and juggling exercising in pulmonary rehab.  Will exercise at the [facility] after graduation while her daughter plays volleyball.  Seems to be enjoying the program, is exercising on her  own at the [facility] on her days off from pulmonary rehab.       Initial Review   Source of Stress Concerns  Chronic Illness;Family  Chronic Illness;Family  Chronic Illness;Family        Psychosocial Discharge (Final Psychosocial Re-Evaluation): Psychosocial Re-Evaluation - 12/26/18 1523      Psychosocial Re-Evaluation   Current issues with  History of Depression;Current Stress Concerns    Comments  Remains positive and works hard while exercising in pulmonary rehab    Expected Outcomes  No psychosocial barriers to participation in pulmonary rehab    Interventions  Encouraged to attend Pulmonary Rehabilitation for the exercise;Relaxation education;Stress management education    Continue Psychosocial Services   Follow up required by  staff    Comments  Seems to be enjoying the program, is exercising on her own at the [facility] on her days off from pulmonary rehab.      Initial Review   Source of Stress Concerns  Chronic Illness;Family       Education: Education Goals: Education classes will be provided on a weekly basis, covering required topics. Participant will state understanding/return demonstration of topics presented.  Learning Barriers/Preferences:   Education Topics: Risk Factor Reduction:  -Group instruction that is supported by a PowerPoint presentation. Instructor discusses the definition of a risk factor, different risk factors for pulmonary disease, and how the heart and lungs work together.     Nutrition for Pulmonary Patient:  -Group instruction provided by PowerPoint slides, verbal discussion, and written materials to support subject matter. The instructor gives an explanation and review of healthy diet recommendations, which includes a discussion on weight management, recommendations for fruit and vegetable consumption, as well as protein, fluid, caffeine, fiber, sodium, sugar, and alcohol. Tips for eating when patients are short of breath are discussed.   Pursed Lip Breathing:  -Group instruction that is supported by demonstration and informational handouts. Instructor discusses the benefits of pursed lip and diaphragmatic breathing and detailed demonstration on how to preform both.     Oxygen Safety:  -Group instruction provided by PowerPoint, verbal discussion, and written material to support subject matter. There is an overview of "What is Oxygen" and "Why do we need it".  Instructor also reviews how to create a safe environment for oxygen use, the importance of using oxygen as prescribed, and the risks of noncompliance. There is a brief discussion on traveling with oxygen and resources the patient may utilize.   Oxygen Equipment:  -Group instruction provided by [facility] Staff utilizing  handouts, written materials, and equipment demonstrations.   PULMONARY REHAB OTHER RESPIRATORY from 11/30/2018 in [place]  Date  11/16/18  Educator  Ace Gins Rep  Instruction Review Code  1- Verbalizes Understanding      Signs and Symptoms:  -Group instruction provided by written material and verbal discussion to support subject matter. Warning signs and symptoms of infection, stroke, and heart attack are reviewed and when to call the physician/911 reinforced. Tips for preventing the spread of infection discussed.   PULMONARY REHAB OTHER RESPIRATORY from 11/30/2018 in [place]  Date  11/30/18  Educator  [name]  Instruction Review Code  1- Verbalizes Understanding      Advanced Directives:  -Group instruction provided by verbal instruction and written material to support subject matter. Instructor reviews Advanced Directive laws and proper instruction for filling out document.   Pulmonary Video:  -Group video education that reviews the importance of medication and  oxygen compliance, exercise, good nutrition, pulmonary hygiene, and pursed lip and diaphragmatic breathing for the pulmonary patient.   Exercise for the Pulmonary Patient:  -Group instruction that is supported by a PowerPoint presentation. Instructor discusses benefits of exercise, core components of exercise, frequency, duration, and intensity of an exercise routine, importance of utilizing pulse oximetry during exercise, safety while exercising, and options of places to exercise outside of rehab.     Pulmonary Medications:  -Verbally interactive group education provided by instructor with focus on inhaled medications and proper administration.   PULMONARY REHAB OTHER RESPIRATORY from 11/30/2018 in [place]  Date  10/24/18  Educator  Pharmacist  Instruction Review Code  2- Demonstrated Understanding      Anatomy and  Physiology of the Respiratory System and Intimacy:  -Group instruction provided by PowerPoint, verbal discussion, and written material to support subject matter. Instructor reviews respiratory cycle and anatomical components of the respiratory system and their functions. Instructor also reviews differences in obstructive and restrictive respiratory diseases with examples of each. Intimacy, Sex, and Sexuality differences are reviewed with a discussion on how relationships can change when diagnosed with pulmonary disease. Common sexual concerns are reviewed.   PULMONARY REHAB OTHER RESPIRATORY from 11/30/2018 in [place]  Date  11/02/18  Educator  RN  Instruction Review Code  1- Verbalizes Understanding      MD DAY -A group question and answer session with a medical doctor that allows participants to ask questions that relate to their pulmonary disease state.   OTHER EDUCATION -Group or individual verbal, written, or video instructions that support the educational goals of the pulmonary rehab program.   Holiday Eating Survival Tips:  -Group instruction provided by PowerPoint slides, verbal discussion, and written materials to support subject matter. The instructor gives patients tips, tricks, and techniques to help them not only survive but enjoy the holidays despite the onslaught of food that accompanies the holidays.   Knowledge Questionnaire Score: Knowledge Questionnaire Score - 10/13/18 1043      Knowledge Questionnaire Score   Pre Score  12/18       Core Components/Risk Factors/Patient Goals at Admission: Personal Goals and Risk Factors at Admission - 10/13/18 1053      Core Components/Risk Factors/Patient Goals on Admission    Weight Management  Weight Loss;Yes    Intervention  Weight Management: Develop a combined nutrition and exercise program designed to reach desired caloric intake, while maintaining appropriate intake of nutrient and fiber,  sodium and fats, and appropriate energy expenditure required for the weight goal.    Admit Weight  151 lb 10.8 oz (68.8 kg)    Goal Weight: Short Term  141 lb (64 kg)    Expected Outcomes  Short Term: Continue to assess and modify interventions until short term weight is achieved;Long Term: Adherence to nutrition and physical activity/exercise program aimed toward attainment of established weight goal    Improve shortness of breath with ADL's  Yes    Intervention  Provide education, individualized exercise plan and daily activity instruction to help decrease symptoms of SOB with activities of daily living.    Expected Outcomes  Short Term: Improve cardiorespiratory fitness to achieve a reduction of symptoms when performing ADLs;Long Term: Be able to perform more ADLs without symptoms or delay the onset of symptoms    Stress  Yes    Intervention  Offer individual and/or small group education and counseling on adjustment to heart disease, stress  management and health-related lifestyle change. Teach and support self-help strategies.;Refer participants experiencing significant psychosocial distress to appropriate mental health specialists for further evaluation and treatment. When possible, include family members and significant others in education/counseling sessions.    Expected Outcomes  Short Term: Participant demonstrates changes in health-related behavior, relaxation and other stress management skills, ability to obtain effective social support, and compliance with psychotropic medications if prescribed.;Long Term: Emotional wellbeing is indicated by absence of clinically significant psychosocial distress or social isolation.       Core Components/Risk Factors/Patient Goals Review:  Goals and Risk Factor Review    Row Name 10/13/18 1055 11/07/18 1620 12/04/18 1359 12/26/18 1527       Core Components/Risk Factors/Patient Goals Review   Personal Goals Review  Weight Management/Obesity;Improve  shortness of breath with ADL's;Develop more efficient breathing techniques such as purse lipped breathing and diaphragmatic breathing and practicing self-pacing with activity.;Increase knowledge of respiratory medications and ability to use respiratory devices properly.;Stress  Weight Management/Obesity;Improve shortness of breath with ADL's;Stress  Weight Management/Obesity;Improve shortness of breath with ADL's;Increase knowledge of respiratory medications and ability to use respiratory devices properly.;Develop more efficient breathing techniques such as purse lipped breathing and diaphragmatic breathing and practicing self-pacing with activity.  Weight Management/Obesity;Improve shortness of breath with ADL's;Increase knowledge of respiratory medications and ability to use respiratory devices properly.;Develop more efficient breathing techniques such as purse lipped breathing and diaphragmatic breathing and practicing self-pacing with activity.    Review  -  Just started program, too early to have met any goals, the last 3 sessions her mood was positive and in control, seems to be enjoying the program.  Has lost 2 kg and keeping it off, walking 15 laps on the track, and level 2 on recumbent bike, and level 3 on nustep.  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    Expected Outcomes  -  See admission goals  See admission goals  See admission goals       Core Components/Risk Factors/Patient Goals at Discharge (Final Review):  Goals and Risk Factor Review - 12/26/18 1527      Core Components/Risk Factors/Patient Goals Review   Personal Goals Review  Weight Management/Obesity;Improve shortness of breath with ADL's;Increase knowledge of respiratory medications and ability to use respiratory devices properly.;Develop more efficient breathing techniques such as purse  lipped breathing and diaphragmatic breathing and practicing self-pacing with activity.    Review  Program goals are on hold d/t department closure from the COVID-19 precautions, will hopefully reopen 01/23/2019.    Expected Outcomes  See admission goals       ITP Comments: ITP Comments    Row Name 10/13/18 0956           ITP Comments  Dr. Jennet Maduro, medical director, Pulmonary Rehab          Comments: ITP REVIEW Pt is making expected progress toward pulmonary rehab goals after completing 11 sessions. Recommend continued exercise, life style modification, education, and utilization of breathing techniques to increase stamina and strength and decrease shortness of breath with exertion.

## 2018-12-26 NOTE — Addendum Note (Signed)
Encounter addended by: Lance Morin, RN on: 12/26/2018 3:29 PM  Actions taken: Flowsheet data copied forward, Visit Navigator Flowsheet section accepted, Clinical Note Signed

## 2018-12-28 ENCOUNTER — Encounter (HOSPITAL_COMMUNITY): Payer: Medicare Other

## 2018-12-28 ENCOUNTER — Ambulatory Visit: Payer: Medicare Other | Admitting: Internal Medicine

## 2019-01-02 ENCOUNTER — Encounter (HOSPITAL_COMMUNITY): Payer: Medicare Other

## 2019-01-04 ENCOUNTER — Encounter (HOSPITAL_COMMUNITY): Payer: Medicare Other

## 2019-01-09 ENCOUNTER — Encounter (HOSPITAL_COMMUNITY): Payer: Medicare Other

## 2019-01-11 ENCOUNTER — Encounter (HOSPITAL_COMMUNITY): Payer: Medicare Other

## 2019-01-16 ENCOUNTER — Encounter (HOSPITAL_COMMUNITY): Payer: Medicare Other

## 2019-01-16 ENCOUNTER — Telehealth (HOSPITAL_COMMUNITY): Payer: Self-pay

## 2019-01-18 ENCOUNTER — Encounter (HOSPITAL_COMMUNITY): Payer: Medicare Other

## 2019-01-23 ENCOUNTER — Encounter (HOSPITAL_COMMUNITY): Payer: Medicare Other

## 2019-01-25 ENCOUNTER — Encounter (HOSPITAL_COMMUNITY): Payer: Medicare Other

## 2019-03-08 NOTE — Progress Notes (Signed)
Discharge Progress Report  Patient Details  Name: ASHARI LLEWELLYN MRN: [id number] Date of Birth: 31-Oct-1965 Referring Provider:     Pulmonary Rehab Walk Test from 10/17/2018 in [place]  Referring Provider  Dr. Melvyn Novas       Number of Visits: 11  Reason for Discharge:  Patient independent in their exercise. Patient has met program and personal goals.  Smoking History:  Social History   Tobacco Use  Smoking Status Never Smoker  Smokeless Tobacco Never Used    Diagnosis:  Severe persistent asthma without complication  ADL [facility]: Pulmonary Assessment Scores    Row Name 10/13/18 1033         ADL [facility]   SOB Score total  46       CAT Score   CAT Score  12        Initial Exercise Prescription: Initial Exercise Prescription - 10/17/18 1300      Date of Initial Exercise RX and Referring Provider   Date  10/17/18    Referring Provider  Dr. Melvyn Novas      Recumbant Bike   Level  1.5    Watts  10    Minutes  17      NuStep   Level  2    SPM  80    Minutes  17      Track   Laps  10    Minutes  17      Prescription Details   Frequency (times per week)  2    Duration  Progress to 45 minutes of aerobic exercise without signs/symptoms of physical distress      Intensity   THRR 40-80% of Max Heartrate  67-134    Ratings of Perceived Exertion  11-13    Perceived Dyspnea  0-4      Progression   Progression  Continue to progress workloads to maintain intensity without signs/symptoms of physical distress.      Resistance Training   Training Prescription  Yes    Weight  orange bands    Reps  10-15       Discharge Exercise Prescription (Final Exercise Prescription Changes): Exercise Prescription Changes - 12/05/18 1500      Response to Exercise   Blood Pressure (Admit)  120/78    Blood Pressure (Exercise)  106/70    Blood Pressure (Exit)  104/70    Heart Rate (Admit)  92 bpm    Heart Rate (Exercise)  121 bpm    Heart Rate (Exit)   92 bpm    Oxygen Saturation (Admit)  95 %    Oxygen Saturation (Exercise)  91 %    Oxygen Saturation (Exit)  92 %    Rating of Perceived Exertion (Exercise)  18    Perceived Dyspnea (Exercise)  1    Duration  Progress to 45 minutes of aerobic exercise without signs/symptoms of physical distress    Intensity  THRR unchanged      Progression   Progression  Continue to progress workloads to maintain intensity without signs/symptoms of physical distress.      Resistance Training   Training Prescription  Yes    Weight  orange bands    Reps  10-15    Time  10 Minutes      Recumbant Bike   Level  2    Watts  10    Minutes  17      NuStep   Level  3  SPM  80    Minutes  17      Track   Laps  14    Minutes  17       Functional Capacity: 6 Minute Walk    Row Name 10/17/18 1308         6 Minute Walk   Phase  Initial     Distance  1148 feet     Walk Time  6 minutes     # of Rest Breaks  0     MPH  2.17     METS  2.68     RPE  13     Perceived Dyspnea   1     Symptoms  Yes (comment)     Comments  neck pain 5/10, hip and lower back pain 4/10     Resting HR  85 bpm     Resting BP  106/68     Resting Oxygen Saturation   97 %     Exercise Oxygen Saturation  during 6 min walk  93 %     Max Ex. HR  111 bpm     Max Ex. BP  128/72     2 Minute Post BP  108/68       Interval HR   1 Minute HR  97     2 Minute HR  109     3 Minute HR  107     4 Minute HR  108     5 Minute HR  109     6 Minute HR  111     2 Minute Post HR  83     Interval Heart Rate?  Yes       Interval Oxygen   Interval Oxygen?  Yes     Baseline Oxygen Saturation %  97 %     1 Minute Oxygen Saturation %  93 %     1 Minute Liters of Oxygen  0 L     2 Minute Oxygen Saturation %  92 %     2 Minute Liters of Oxygen  0 L     3 Minute Oxygen Saturation %  93 %     3 Minute Liters of Oxygen  0 L     4 Minute Oxygen Saturation %  93 %     4 Minute Liters of Oxygen  0 L     5 Minute Oxygen Saturation  %  94 %     5 Minute Liters of Oxygen  0 L     6 Minute Oxygen Saturation %  94 %     6 Minute Liters of Oxygen  0 L     2 Minute Post Oxygen Saturation %  96 %     2 Minute Post Liters of Oxygen  0 L        Psychological, QOL, Others - Outcomes: PHQ 2/9: Depression screen PHQ 2/9 10/13/2018  Decreased Interest 0  Down, Depressed, Hopeless 0  PHQ - 2 Score 0  Altered sleeping 0  Tired, decreased energy 3  Change in appetite 3  Feeling bad or failure about yourself  0  Trouble concentrating 0  Moving slowly or fidgety/restless 0  Suicidal thoughts 0  PHQ-9 Score 6  Difficult doing work/chores Not difficult at all    Quality of Life:   Personal Goals: Goals established at orientation with interventions provided to work toward goal. Personal Goals and Risk Factors at Admission - 10/13/18  1053      Core Components/Risk Factors/Patient Goals on Admission    Weight Management  Weight Loss;Yes    Intervention  Weight Management: Develop a combined nutrition and exercise program designed to reach desired caloric intake, while maintaining appropriate intake of nutrient and fiber, sodium and fats, and appropriate energy expenditure required for the weight goal.    Admit Weight  151 lb 10.8 oz (68.8 kg)    Goal Weight: Short Term  141 lb (64 kg)    Expected Outcomes  Short Term: Continue to assess and modify interventions until short term weight is achieved;Long Term: Adherence to nutrition and physical activity/exercise program aimed toward attainment of established weight goal    Improve shortness of breath with ADL's  Yes    Intervention  Provide education, individualized exercise plan and daily activity instruction to help decrease symptoms of SOB with activities of daily living.    Expected Outcomes  Short Term: Improve cardiorespiratory fitness to achieve a reduction of symptoms when performing ADLs;Long Term: Be able to perform more ADLs without symptoms or delay the onset of  symptoms    Stress  Yes    Intervention  Offer individual and/or small group education and counseling on adjustment to heart disease, stress management and health-related lifestyle change. Teach and support self-help strategies.;Refer participants experiencing significant psychosocial distress to appropriate mental health specialists for further evaluation and treatment. When possible, include family members and significant others in education/counseling sessions.    Expected Outcomes  Short Term: Participant demonstrates changes in health-related behavior, relaxation and other stress management skills, ability to obtain effective social support, and compliance with psychotropic medications if prescribed.;Long Term: Emotional wellbeing is indicated by absence of clinically significant psychosocial distress or social isolation.        Personal Goals Discharge: Goals and Risk Factor Review    Row Name 10/13/18 1055 11/07/18 1620 12/04/18 1359 12/26/18 1527       Core Components/Risk Factors/Patient Goals Review   Personal Goals Review  Weight Management/Obesity;Improve shortness of breath with ADL's;Develop more efficient breathing techniques such as purse lipped breathing and diaphragmatic breathing and practicing self-pacing with activity.;Increase knowledge of respiratory medications and ability to use respiratory devices properly.;Stress  Weight Management/Obesity;Improve shortness of breath with ADL's;Stress  Weight Management/Obesity;Improve shortness of breath with ADL's;Increase knowledge of respiratory medications and ability to use respiratory devices properly.;Develop more efficient breathing techniques such as purse lipped breathing and diaphragmatic breathing and practicing self-pacing with activity.  Weight Management/Obesity;Improve shortness of breath with ADL's;Increase knowledge of respiratory medications and ability to use respiratory devices properly.;Develop more efficient breathing  techniques such as purse lipped breathing and diaphragmatic breathing and practicing self-pacing with activity.    Review  -  Just started program, too early to have met any goals, the last 3 sessions her mood was positive and in control, seems to be enjoying the program.  Has lost 2 kg and keeping it off, walking 15 laps on the track, and level 2 on recumbent bike, and level 3 on nustep.  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    Expected Outcomes  -  See admission goals  See admission goals  See admission goals       Exercise Goals and Review: Exercise Goals    Row Name 10/13/18 1022  Exercise Goals   Increase Physical Activity  Yes       Intervention  Provide advice, education, support and counseling about physical activity/exercise needs.;Develop an individualized exercise prescription for aerobic and resistive training based on initial evaluation findings, risk stratification, comorbidities and participant's personal goals.       Expected Outcomes  Short Term: Attend rehab on a regular basis to increase amount of physical activity.;Long Term: Add in home exercise to make exercise part of routine and to increase amount of physical activity.;Long Term: Exercising regularly at least 3-5 days a week.       Increase Strength and Stamina  Yes       Intervention  Provide advice, education, support and counseling about physical activity/exercise needs.;Develop an individualized exercise prescription for aerobic and resistive training based on initial evaluation findings, risk stratification, comorbidities and participant's personal goals.       Expected Outcomes  Short Term: Increase workloads from initial exercise prescription for resistance, speed, and METs.       Able to understand and use rate of perceived exertion (RPE) scale  Yes        Intervention  Provide education and explanation on how to use RPE scale       Expected Outcomes  Short Term: Able to use RPE daily in rehab to express subjective intensity level;Long Term:  Able to use RPE to guide intensity level when exercising independently       Able to understand and use Dyspnea scale  Yes       Intervention  Provide education and explanation on how to use Dyspnea scale       Expected Outcomes  Short Term: Able to use Dyspnea scale daily in rehab to express subjective sense of shortness of breath during exertion;Long Term: Able to use Dyspnea scale to guide intensity level when exercising independently       Knowledge and understanding of Target Heart Rate Range (THRR)  Yes       Intervention  Provide education and explanation of THRR including how the numbers were predicted and where they are located for reference       Expected Outcomes  Short Term: Able to state/look up THRR;Long Term: Able to use THRR to govern intensity when exercising independently;Short Term: Able to use daily as guideline for intensity in rehab       Understanding of Exercise Prescription  Yes       Intervention  Provide education, explanation, and written materials on patient's individual exercise prescription       Expected Outcomes  Short Term: Able to explain program exercise prescription;Long Term: Able to explain home exercise prescription to exercise independently          Exercise Goals Re-Evaluation: Exercise Goals Re-Evaluation    Row Name 11/06/18 3716 12/05/18 0853 12/25/18 0740         Exercise Goal Re-Evaluation   Exercise Goals Review  Increase Physical Activity;Increase Strength and Stamina;Able to understand and use rate of perceived exertion (RPE) scale;Able to understand and use Dyspnea scale;Knowledge and understanding of Target Heart Rate Range (THRR);Understanding of Exercise Prescription  Increase Physical Activity;Increase Strength and Stamina;Able to understand and use rate  of perceived exertion (RPE) scale;Able to understand and use Dyspnea scale;Knowledge and understanding of Target Heart Rate Range (THRR);Understanding of Exercise Prescription  -     Comments  Patient has only completed 3 rehab sessions. Patient's MET level falls in a low level. Will continue to monitor patient and  progress as able.  Patient is progressing well in program. She is motivated to make lifestyle chances and lose weight. She is able to walk 15 laps (200 ft) in 15 minutes. Her MET level average places her in a low level. Will continue to monitor patient and emphasize the importance of home exercise.  Pulmonary Rehab is closed (starting 12/18/18) until further notice due to the [facility] Virus.      Expected Outcomes  Through exercise at rehab, patient will increase physical capacity, decrease shortness of breath, and feel confident in implementing and exercise routine at home. Also, when patient completes their graduation 6MWT they will increase their walk test distance by atleast 100 feet.   Through exercise at rehab, patient will increase physical capacity, decrease shortness of breath, and feel confident in implementing and exercise routine at home. Also, when patient completes their graduation 6MWT they will increase their walk test distance by atleast 100 feet.   -        Nutrition & Weight - Outcomes: Pre Biometrics - 10/13/18 1021      Pre Biometrics   Grip Strength  15 kg        Nutrition: Nutrition Therapy & Goals - 03/08/19 1419      Nutrition Therapy   Diet  general healthful      Personal Nutrition Goals   Nutrition Goal  Pt to build a healthy plate including fruits, vegetables, whole grains, lean protein, healthy fat and low fat dairy    Personal Goal #2  Weigh and measure portions for accuracy    Personal Goal #3  Practice mindful eating exercises    Personal Goal #4  Identify food quantities necessary to achieve wt loss of  -2# per week to a goal wt loss of 6-24 lb at  graduation from pulmonary rehab      [place], educate and counsel regarding individualized specific dietary modifications aiming towards targeted core components such as weight, hypertension, lipid management, diabetes, heart failure and other comorbidities.    Expected Outcomes  Short Term Goal: Understand basic principles of dietary content, such as calories, fat, sodium, cholesterol and nutrients.;Long Term Goal: Adherence to prescribed nutrition plan.       Nutrition Discharge: Nutrition Assessments - 03/08/19 1419      Rate Your Plate Scores   Pre Score  54    Post Score  --   pt did not complete post survey      Education Questionnaire Score: Knowledge Questionnaire Score - 10/13/18 1043      Knowledge Questionnaire Score   Pre Score  12/18       Goals reviewed with patient; copy given to patient.

## 2019-03-08 NOTE — Addendum Note (Signed)
Encounter addended by: Ivonne Andrew, RD on: 03/08/2019 2:22 PM  Actions taken: Flowsheet data copied forward, Visit Navigator Flowsheet section accepted

## 2019-03-08 NOTE — Addendum Note (Signed)
Encounter addended by: Lance Morin, RN on: 03/08/2019 3:08 PM  Actions taken: Clinical Note Signed, Episode resolved

## 2019-04-04 ENCOUNTER — Other Ambulatory Visit: Payer: Self-pay

## 2019-04-04 ENCOUNTER — Encounter: Payer: Self-pay | Admitting: Internal Medicine

## 2019-04-04 ENCOUNTER — Ambulatory Visit: Payer: Medicare Other | Admitting: Internal Medicine

## 2019-04-04 DIAGNOSIS — J4550 Severe persistent asthma, uncomplicated: Secondary | ICD-10-CM | POA: Diagnosis not present

## 2019-04-04 MED ORDER — ALBUTEROL SULFATE HFA 108 (90 BASE) MCG/ACT IN AERS: INHALATION_SPRAY | RESPIRATORY_TRACT | 1 refills | Status: DC

## 2019-04-04 NOTE — Patient Instructions (Addendum)
Plan A = Automatic = dulera 200 Take 2 puffs first thing in am and then another 2 puffs about 12 hours later.   Work on inhaler technique:  relax and gently blow all the way out then take a nice smooth deep breath back in, triggering the inhaler at same time you start breathing in.  Hold for up to 5 seconds if you can. Blow out thru nose. Rinse and gargle with water when done    Plan B = Backup Only use your albuterol inhaler as a rescue medication to be used if you can't catch your breath by resting or doing a relaxed purse lip breathing pattern.  - The less you use it, the better it will work when you need it. - Ok to use the inhaler up to 2 puffs  every 4 hours if you must but call for appointment if use goes up over your usual need - Don't leave home without it !!  (think of it like the spare tire for your car)    Please schedule a follow up visit in 6 months but call sooner if needed

## 2019-04-04 NOTE — Assessment & Plan Note (Signed)
Never smoker  PFTs 01/28/06 FEV1 44%  ratio 39% diffusing capacity  116% with 8% improvement after B2 - PFTs 06/25/11 FEV1 0.96 (40%)   Ratio 37  - PFT's  05/30/2015  FEV1 0.90 (38 % ) ratio 48  No % improvement from saba with DLCO  127 %    p dulera 200 that am  - 06/02/2016 p extensive coaching HFA effectiveness =  90% - rec add pepcid ac 20 mg hs to see if helps am "wheeze/sob      - PFT's  07/13/2017  FEV1 0.85 (36 % ) ratio 44   p 9 % improvement from saba p dulera 200 prior to study with DLCO  114/113c % corrects to 148  % for alv volume    - .PFT's  09/13/2018  FEV1 0.88 (38 % ) ratio 48  p 3 % improvement from saba p qvar and symb prior to study with DLCO  112 % corrects to 145  % for alv volume    - 04/04/2019  After extensive coaching inhaler device,  effectiveness =    75%   (short Ti)   All goals of chronic asthma control met including optimal (though not nl)  function and elimination of symptoms with minimal need for rescue therapy.  Contingencies discussed in full including contacting this office immediately if not controlling the symptoms using the rule of two's.      I spent extra time with pt today reviewing appropriate use of albuterol for prn use on exertion with the following points: 1) saba is for relief of sob that does not improve by walking a slower pace or resting but rather if the pt does not improve after trying this first. 2) If the pt is convinced, as many are, that saba helps recover from activity faster then it's easy to tell if this is the case by re-challenging : ie stop, take the inhaler, then p 5 minutes try the exact same activity (intensity of workload) that just caused the symptoms and see if they are substantially diminished or not after saba 3) if there is an activity that reproducibly causes the symptoms, try the saba 15 min before the activity on alternate days   If in fact the saba really does help, then fine to continue to use it prn but advised may need to  look closer at the maintenance regimen being used to achieve better control of airways disease with exertion.    I had an extended discussion with the patient reviewing all relevant studies completed to date and  lasting 15 to 20 minutes of a 25 minute visit    See device teaching which extended face to face time for this visit.  Each maintenance medication was reviewed in detail including emphasizing most importantly the difference between maintenance and prns and under what circumstances the prns are to be triggered using an action plan format that is not reflected in the computer generated alphabetically organized AVS which I have not found useful in most complex patients, especially with respiratory illnesses  Please see AVS for specific instructions unique to this visit that I personally wrote and verbalized to the the pt in detail and then reviewed with pt  by my nurse highlighting any  changes in therapy recommended at today's visit to their plan of care.

## 2019-04-04 NOTE — Progress Notes (Signed)
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  She weaned herself off of prednisone successfully   in 11/2007     History of Present Illness  03/22/2012 f/u ov/Zelena Bushong no longer on prednisone at all for RA cc persistent chronic doe x walking dog x sev minutes, worse with hills, does fine at rest, using saba sev times per day with some improvement but not relief of sob despite advair 500 rec Dulera 200 Take 2 puffs first thing in am and then another 2 puffs about 12 hours later.  Only use your albuterol (maxair) as a rescue medication   Work on inhaler technique:   10/11/2014 f/u ov/Wen Merced re: asthma/  dulera 200 2bid maint / prn [place] for RA per [facility] Chief Complaint  Patient presents with  . Follow-up    Pt states that her breathing "is crappy as it has always been". She is using rescue inhaler very rarely.   using recumbent bike x 30 min daily s stopping    Sleeping fine, cough not an issue No longer walking dog  rec Continue dulera 200 Take 2 puffs first thing in am and then another 2 puffs about 12 hours later.  Try using maxair 5 min before exercise to see what difference it makes GERD diet           09/13/2018  f/u ov/Devean Skoczylas re: RA bronchiolitis vs asthma ? Related to gold exp on dulera 200 bid and qvar 1 pff daily but hfa poor and on timolol eyedrops  Chief Complaint  Patient presents with  . Follow-up    Breathing is unchanged since the last visit. She does not have a rescue inhaler.    Dyspnea:  X 50 ft -note this was not reproduced at the office visit Cough: dry/ daytime Sleeping: ok flat bed rec Dulera 200 each am followed by Qvar 40 2 pffs and repeat 12 hours Please see patient  coordinator before you leave today  to schedule pulmonary rehab Please schedule a follow up visit in 3 months but call sooner if needed  - check re timolol rx  And consider adding spiriva trial if not done / prn saba next     04/04/2019  f/u ov/Annaya Bangert re:   RA bronchiolitis vs asthma  On oncencia /dulera 200 2bid and better p rehab Chief Complaint  Patient presents with  . Follow-up    Breathing is doing some better since the last visit. She is out of her qvar inhaler and albuterol inhaler.   Dyspnea:  Walking up to a mile / ex bike up to 2 miles on dulera 200  Cough: none Sleeping: able to lie flat flat/ one pillow SABA use: doesn't have one  02: no   No obvious day to day or daytime variability or assoc excess/ purulent sputum or mucus plugs or hemoptysis or cp or chest tightness, subjective wheeze or overt sinus or hb symptoms.   Sleeping ok as above  without nocturnal  or early am exacerbation  of respiratory  c/o's or need for noct saba. Also denies any obvious fluctuation of symptoms with weather or environmental changes or other aggravating or alleviating factors except as  outlined above   No unusual exposure hx or h/o childhood pna/ asthma or knowledge of premature birth.  Current Allergies, Complete Past Medical History, Past Surgical History, Family History, and Social History were reviewed in [employer] record.  ROS  The following are not active complaints unless bolded Hoarseness, sore throat, dysphagia, dental problems, itching, sneezing,  nasal congestion or discharge of excess mucus or purulent secretions, ear ache,   fever, chills, sweats, unintended wt loss or wt gain, classically pleuritic or exertional cp,  orthopnea pnd or arm/hand swelling  or leg swelling, presyncope, palpitations, abdominal pain, anorexia, nausea, vomiting, diarrhea  or change in bowel habits or change in bladder habits, change in stools or change in urine, dysuria, hematuria,   rash, arthralgias better , visual complaints, headache, numbness, weakness or ataxia or problems with walking or coordination,  change in mood or  memory.        Current Meds  Medication Sig  . abatacept (ORENCIA) 250 MG injection Inject into the vein once a week.   . bimatoprost (LUMIGAN) 0.01 % SOLN As directed  . brimonidine (ALPHAGAN P) 0.1 % SOLN Place 1 drop into the left eye 2 (two) times daily.  . celecoxib (CELEBREX) 200 MG capsule Take 200 mg by mouth daily. Take 1 tablet every other day  . mometasone-formoterol (DULERA) 200-5 MCG/ACT AERO INHALE TWO PUFFS BY MOUTH EVERY MORNING AND INHALE TWO PUFFS BY MOUTH 12 HOURS LATER  . venlafaxine (EFFEXOR) 37.5 MG tablet Take 37.5 mg by mouth daily.            Past Medical History:  COUGH (ICD-786.2)  RHINITIS, CHRONIC (ICD-472.0)  ASTHMA (ICD-493.90)  ARTHRITIS, RHEUMATOID, SEVERE (ICD-714.0)............................................Marland KitchenSchriver @ [facility]  - Sept 17 2009 started daily prednisone > tapered off        Objective:   Physical Exam   04/04/2019   146 10/11/2014          141 > 06/03/2015  132 >  06/02/2016   150  > 07/13/2017   147 > 09/13/2018   150     03/22/12 131 lb 12.8 oz (59.784 kg)  08/19/09 125 lb 2.1 oz (56.759 kg)  11/13/08 122 lb 4 oz (55.452 kg)     Vital signs reviewed - Note on arrival 02 sats  96% on RA       LUNGS: no acc muscle use,  kyphotic chest with minimal insp/exp rhonchi  bilaterally   classic severe RA changes both hands s calf tenderness, cyanosis or clubbing    HEENT: nl dentition, turbinates bilaterally, and oropharynx. Nl external ear canals without cough reflex   NECK :  without JVD/Nodes/TM/ nl carotid upstrokes bilaterally   LUNGS: no acc muscle use,  Mildly kyphotic chest which is clear to A and P bilaterally without cough on insp or exp maneuvers   CV:  RRR  no s3 or murmur or increase in P2, and no edema   ABD:  soft and nontender with nl inspiratory excursion in the supine  position. No bruits or organomegaly appreciated, bowel sounds nl  MS:  Nl gait/ ext warm with  Typical RA hand/wrist deformities, calf tenderness, cyanosis or clubbing    SKIN: warm and dry without lesions    NEURO:  alert, approp, nl sensorium with  no motor or cerebellar deficits apparent.           Assessment & Plan:

## 2019-05-16 DIAGNOSIS — M0800 Unspecified juvenile rheumatoid arthritis of unspecified site: Secondary | ICD-10-CM | POA: Diagnosis not present

## 2019-05-16 DIAGNOSIS — Z79899 Other long term (current) drug therapy: Secondary | ICD-10-CM | POA: Diagnosis not present

## 2019-05-16 DIAGNOSIS — G5622 Lesion of ulnar nerve, left upper limb: Secondary | ICD-10-CM | POA: Diagnosis not present

## 2019-05-18 DIAGNOSIS — N200 Calculus of kidney: Secondary | ICD-10-CM | POA: Diagnosis not present

## 2019-06-12 DIAGNOSIS — M06022 Rheumatoid arthritis without rheumatoid factor, left elbow: Secondary | ICD-10-CM | POA: Diagnosis not present

## 2019-06-12 DIAGNOSIS — G5622 Lesion of ulnar nerve, left upper limb: Secondary | ICD-10-CM | POA: Diagnosis not present

## 2019-06-27 DIAGNOSIS — K529 Noninfective gastroenteritis and colitis, unspecified: Secondary | ICD-10-CM | POA: Diagnosis not present

## 2019-07-25 DIAGNOSIS — G5622 Lesion of ulnar nerve, left upper limb: Secondary | ICD-10-CM | POA: Diagnosis not present

## 2019-08-22 DIAGNOSIS — E559 Vitamin D deficiency, unspecified: Secondary | ICD-10-CM | POA: Diagnosis not present

## 2019-08-22 DIAGNOSIS — Z23 Encounter for immunization: Secondary | ICD-10-CM | POA: Diagnosis not present

## 2019-08-22 DIAGNOSIS — M069 Rheumatoid arthritis, unspecified: Secondary | ICD-10-CM | POA: Diagnosis not present

## 2019-08-22 DIAGNOSIS — Z0000 Encounter for general adult medical examination without abnormal findings: Secondary | ICD-10-CM | POA: Diagnosis not present

## 2019-08-22 DIAGNOSIS — F419 Anxiety disorder, unspecified: Secondary | ICD-10-CM | POA: Diagnosis not present

## 2019-08-22 DIAGNOSIS — Z1389 Encounter for screening for other disorder: Secondary | ICD-10-CM | POA: Diagnosis not present

## 2019-09-11 DIAGNOSIS — G5622 Lesion of ulnar nerve, left upper limb: Secondary | ICD-10-CM | POA: Diagnosis not present

## 2019-10-08 ENCOUNTER — Other Ambulatory Visit: Payer: Self-pay | Admitting: Internal Medicine

## 2019-10-10 ENCOUNTER — Other Ambulatory Visit: Payer: Self-pay

## 2019-10-10 ENCOUNTER — Ambulatory Visit: Payer: Medicare Other | Admitting: Internal Medicine

## 2019-10-10 ENCOUNTER — Encounter: Payer: Self-pay | Admitting: Internal Medicine

## 2019-10-10 DIAGNOSIS — J4550 Severe persistent asthma, uncomplicated: Secondary | ICD-10-CM

## 2019-10-10 MED ORDER — ALBUTEROL SULFATE HFA 108 (90 BASE) MCG/ACT IN AERS: INHALATION_SPRAY | RESPIRATORY_TRACT | 1 refills | Status: DC

## 2019-10-10 NOTE — Patient Instructions (Signed)
No change in medications   Only use your albuterol (Proair) as a rescue medication to be used if you can't catch your breath by resting or doing a relaxed purse lip breathing pattern.  - The less you use it, the better it will work when you need it. - Ok to use up to 2 puffs  every 4 hours if you must but call for immediate appointment if use goes up over your usual need - Don't leave home without it !!  (think of it like the spare tire for your car)    Please schedule a follow up visit in 6 months but call sooner if needed

## 2019-10-10 NOTE — Progress Notes (Signed)
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  She weaned herself off of prednisone successfully   in 11/2007     History of Present Illness  03/22/2012 f/u ov/Kayla [name] no longer on prednisone at all for RA cc persistent chronic doe x walking dog x sev minutes, worse with hills, does fine at rest, using saba sev times per day with some improvement but not relief of sob despite advair 500 rec Dulera 200 Take 2 puffs first thing in am and then another 2 puffs about 12 hours later.  Only use your albuterol (maxair) as a rescue medication   Work on inhaler technique:   10/11/2014 f/u ov/Kayla [name] re: asthma/  dulera 200 2bid maint / prn [place] for RA per [facility] Chief Complaint  Patient presents with  . Follow-up    Pt states that her breathing "is crappy as it has always been". She is using rescue inhaler very rarely.   using recumbent bike x 30 min daily s stopping    Sleeping fine, cough not an issue No longer walking dog  rec Continue dulera 200 Take 2 puffs first thing in am and then another 2 puffs about 12 hours later.  Try using maxair 5 min before exercise to see what difference it makes GERD diet           09/13/2018  f/u ov/Kayla [name] re: RA bronchiolitis vs asthma ? Related to gold exp on dulera 200 bid and qvar 1 pff daily but hfa poor and on timolol eyedrops  Chief Complaint  Patient presents with  . Follow-up    Breathing is unchanged since the last visit. She does not have a rescue inhaler.    Dyspnea:  X 50 ft -note this was not reproduced at the office visit Cough: dry/ daytime Sleeping: ok flat bed rec Dulera 200 each am followed by Qvar 40 2 pffs and repeat 12 hours Please see patient  coordinator before you leave today  to schedule pulmonary rehab Please schedule a follow up visit in 3 months but call sooner if needed  - check re timolol rx  And consider adding spiriva trial if not done / prn saba next     04/04/2019  f/u ov/Kayla [name] re:   RA bronchiolitis vs asthma  On orcencia /dulera 200 2bid and better p rehab Chief Complaint  Patient presents with  . Follow-up    Breathing is doing some better since the last visit. She is out of her qvar inhaler and albuterol inhaler.   Dyspnea:  Walking up to a mile / ex bike up to 2 miles on dulera 200  Cough: none Sleeping: able to lie flat flat/ one pillow SABA use: doesn't have one  rec Plan A = Automatic = dulera 200 Take 2 puffs first thing in am and then another 2 puffs about 12 hours later.  Work on inhaler technique:  Plan B = Backup Only use your albuterol inhaler   10/10/2019  f/u ov/Kayla [name] re: RA bronchiolitis doing better on [place] / dulera 200 2bid Chief Complaint  Patient presents with  . Follow-up    Breathing is about the same. No new co's. She has not had  a rescue inhaler in the past several months.   Dyspnea:  Not limited by breathing from desired activities but by orthopedic issue / does recumbent bike up 30 min / not monitoring sats  Cough: none  Sleeping: none flat/ one pillow  SABA use: doesn't have one  02: none    No obvious day to day or daytime variability or assoc excess/ purulent sputum or mucus plugs or hemoptysis or cp or chest tightness, subjective wheeze or overt sinus or hb symptoms.   Sleeping as above without nocturnal  or early am exacerbation  of respiratory  c/o's or need for noct saba. Also denies any obvious fluctuation of symptoms with weather or environmental changes or other aggravating or alleviating factors except as outlined above   No unusual exposure hx or h/o childhood pna/ asthma or knowledge of premature birth.  Current Allergies, Complete Past Medical History, Past Surgical  History, Family History, and Social History were reviewed in [employer] record.  ROS  The following are not active complaints unless bolded Hoarseness, sore throat, dysphagia, dental problems, itching, sneezing,  nasal congestion or discharge of excess mucus or purulent secretions, ear ache,   fever, chills, sweats, unintended wt loss or wt gain, classically pleuritic or exertional cp,  orthopnea pnd or arm/hand swelling  or leg swelling, presyncope, palpitations, abdominal pain, anorexia, nausea, vomiting, diarrhea  or change in bowel habits or change in bladder habits, change in stools or change in urine, dysuria, hematuria,  rash, arthralgias, visual complaints, headache, numbness, weakness or ataxia or problems with walking or coordination,  change in mood or  memory.        Current Meds  Medication Sig  . abatacept (ORENCIA) 250 MG injection Inject into the vein once a week.   . bimatoprost (LUMIGAN) 0.01 % SOLN As directed  . brimonidine (ALPHAGAN P) 0.1 % SOLN Place 1 drop into the left eye 2 (two) times daily.  . celecoxib (CELEBREX) 200 MG capsule Take 200 mg by mouth daily. Take 1 tablet every other day  . mometasone-formoterol (DULERA) 200-5 MCG/ACT AERO INHALE TWO PUFFS BY MOUTH EVERY MORNING AND THEN INHALE TWO PUFFS BY MOUTH DAILY 12 HOURS LATER  . venlafaxine (EFFEXOR) 37.5 MG tablet Take 37.5 mg by mouth daily.                   Past Medical History:  COUGH (ICD-786.2)  RHINITIS, CHRONIC (ICD-472.0)  ASTHMA (ICD-493.90)  ARTHRITIS, RHEUMATOID, SEVERE (ICD-714.0)............................................Marland KitchenSchriver @ [facility]  - Sept 17 2009 started daily prednisone > tapered off        Objective:   Physical Exam    10/10/2019   154  04/04/2019   146 10/11/2014          141 > 06/03/2015  132 >  06/02/2016   150  > 07/13/2017   147 > 09/13/2018   150     03/22/12 131 lb 12.8 oz (59.784 kg)  08/19/09 125 lb 2.1 oz (56.759 kg)  11/13/08 122 lb 4 oz  (55.452 kg)        pleasant amb wf nad  Vital signs reviewed - Note on arrival 02 sats  99% on RA      HEENT : pt wearing mask not removed for exam due to covid -19 concerns.    NECK :  without JVD/Nodes/TM/ nl carotid upstrokes bilaterally   LUNGS: no acc muscle use, Mildly kyphotic chest which is clear to A and P bilaterally without cough  on insp or exp maneuvers   CV:  RRR  no s3 or murmur or increase in P2, and no edema   ABD:  soft and nontender with nl inspiratory excursion in the supine position. No bruits or organomegaly appreciated, bowel sounds nl  MS:  slt awkward  gait/ ext warm without deformities, calf tenderness, cyanosis or clubbing - Classic RA changes, severe, both hands  SKIN: warm and dry without lesions    NEURO:  alert, approp, nl sensorium with  no motor or cerebellar deficits apparent.             Assessment & Plan:

## 2019-10-11 ENCOUNTER — Encounter: Payer: Self-pay | Admitting: Internal Medicine

## 2019-10-11 NOTE — Assessment & Plan Note (Addendum)
Never smoker  PFTs 01/28/06 FEV1 44%  ratio 39% diffusing capacity  116% with 8% improvement after B2 - PFTs 06/25/11 FEV1 0.96 (40%)   Ratio 37  - PFT's  05/30/2015  FEV1 0.90 (38 % ) ratio 48  No % improvement from saba with DLCO  127 %    p dulera 200 that am  - 06/02/2016 p extensive coaching HFA effectiveness =  90% - rec add pepcid ac 20 mg hs to see if helps am "wheeze/sob      - PFT's  07/13/2017  FEV1 0.85 (36 % ) ratio 44   p 9 % improvement from saba p dulera 200 prior to study with DLCO  114/113c % corrects to 148  % for alv volume    - .PFT's  09/13/2018  FEV1 0.88 (38 % ) ratio 48  p 3 % improvement from saba p qvar and symb prior to study with DLCO  112 % corrects to 145  % for alv volume   - 04/04/2019  After extensive coaching inhaler device,  effectiveness =    75%   (short Ti) - 10/10/2019   Walked RA x two laps =  approx 586f @ avg pace - stopped due to end of study s sob  with sats of 95% at the end of the study.  All goals of chronic asthma control met including optimal function and elimination of symptoms with minimal need for rescue therapy.  Contingencies discussed in full including contacting this office immediately if not controlling the symptoms using the rule of two's.     Pt informed of the seriousness of COVID 19 infection as a direct risk to lung health  and safey and to close contacts and should continue to wear a facemask in public and minimize exposure to public locations but especially avoid any area or activity where non-close contacts are not observing distancing or wearing an appropriate face mask.  I strongly recommended vaccine when offered.    >>> f/u in 6 m to reduce exposure to the pandemic, call sooner if needed   I had an extended discussion with the patient reviewing all relevant studies completed to date and  lasting 15 to 20 minutes of a 25 minute visit  which included directly observing ambulatory 02 saturation study documented in a/p section of  today's   office note.  Each maintenance medication was reviewed in detail including most importantly the difference between maintenance and prns and under what circumstances the prns are to be triggered using an action plan format that is not reflected in the computer generated alphabetically organized AVS.     Please see AVS for specific instructions unique to this visit that I personally wrote and verbalized to the the pt in detail and then reviewed with pt  by my nurse highlighting any changes in therapy recommended at today's visit .

## 2019-11-19 ENCOUNTER — Other Ambulatory Visit: Payer: Self-pay | Admitting: Obstetrics and Gynecology

## 2019-11-19 DIAGNOSIS — N644 Mastodynia: Secondary | ICD-10-CM

## 2019-11-30 ENCOUNTER — Ambulatory Visit: Payer: Medicare Other

## 2019-11-30 ENCOUNTER — Ambulatory Visit
Admission: RE | Admit: 2019-11-30 | Discharge: 2019-11-30 | Disposition: A | Discharge status: Home / Self Care | Payer: Medicare Other | Source: Ambulatory Visit | Attending: Obstetrics and Gynecology | Admitting: Obstetrics and Gynecology

## 2019-11-30 ENCOUNTER — Other Ambulatory Visit: Payer: Self-pay

## 2019-11-30 DIAGNOSIS — N644 Mastodynia: Secondary | ICD-10-CM

## 2020-04-23 ENCOUNTER — Encounter: Payer: Self-pay | Admitting: Adult Health

## 2020-04-23 ENCOUNTER — Other Ambulatory Visit: Payer: Self-pay

## 2020-04-23 ENCOUNTER — Ambulatory Visit: Payer: Medicare Other | Admitting: Internal Medicine

## 2020-04-23 ENCOUNTER — Ambulatory Visit: Payer: Medicare Other | Admitting: Adult Health

## 2020-04-23 DIAGNOSIS — J4550 Severe persistent asthma, uncomplicated: Secondary | ICD-10-CM | POA: Diagnosis not present

## 2020-04-23 DIAGNOSIS — M069 Rheumatoid arthritis, unspecified: Secondary | ICD-10-CM

## 2020-04-23 NOTE — Progress Notes (Signed)
@Patient  ID: Kayla [name], female    DOB: 1966-02-01, 54 y.o.   MRN: [id number]  Chief Complaint  Patient presents with  . Follow-up    Asthma     Referring provider: Lavone Orn, MD  HPI: 54 year old female never smoker followed for chronic asthma and severe deforming rheumatoid arthritis .  Previously steroid-dependent weaned off in 2009. Psychologist .   TEST/EVENTS :  PFTs 01/28/06 FEV1 44%  ratio 39% diffusing capacity  116% with 8% improvement after B2 - PFTs 06/25/11 FEV1 0.96 (40%)   Ratio 37  - PFT's  05/30/2015  FEV1 0.90 (38 % ) ratio 48  No % improvement from saba with DLCO  127 %    p dulera 200 that am  - 06/02/2016 p extensive coaching HFA effectiveness =  90% - rec add pepcid ac 20 mg hs to see if helps am "wheeze/sob      - PFT's  07/13/2017  FEV1 0.85 (36 % ) ratio 44   p 9 % improvement from saba p dulera 200 prior to study with DLCO  114/113c % corrects to 148  % for alv volume    - .PFT's  09/13/2018  FEV1 0.88 (38 % ) ratio 48  p 3 % improvement from saba p qvar and symb prior to study with DLCO  112 % corrects to 145  % for alv volume   - 04/04/2019  After extensive coaching inhaler device,  effectiveness =    75%   (short Ti) - 10/10/2019   Walked RA x two laps =  approx 570ft @ avg pace - stopped due to end of study s sob  with sats of 95% at the end of the study.  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  She has underlying chronic asthma.  Overall says her breathing has been doing well.  She denies any flare of cough or wheezing.  She remains on Dulera twice daily.  She is trying to be active uses a recumbent bike 30 up to 30 minutes at times. Has a functional movement specialist/trainer 2 x week.  Needs to cane to walk long distance. Feels this is really helping her.  Deals with chronic pain on a daily basis.  No increased albuterol use.  No change in activity  tolerance. No antibiotics or steroid use.   She is followed by rheumatology for rheumatoid arthritis.  She is on a [place] . Seems to control arthritis pain.   Allergies  Allergen Reactions  . Prednisone Other (See Comments)    SEVERE PANIC ATTACK  . Sulfa Antibiotics Rash    Immunization History  Administered Date(s) Administered  . 19-influenza Whole 07/04/2018  . Influenza Split 08/04/2014  . Influenza Whole 07/05/2011  . Influenza,inj,Quad PF,6+ Mos 06/02/2016  . Influenza-Unspecified 09/06/2011, 07/04/2014, 08/19/2015, 07/04/2016, 07/04/2017, 07/04/2018, 09/04/2019  . Pneumococcal Polysaccharide-23 10/05/2007    Past Medical History:  Diagnosis Date  . Asthma    daily inhaler  . Dental crowns present   . GERD (gastroesophageal reflux disease)   . Glaucoma   . History of kidney stones   . Limited joint range of motion    cervical spine and jaw - due to RA  . Rheumatoid arthritis(714.0)   . Scalp cyst 10/2016    Tobacco History: Social History   Tobacco Use  Smoking Status Never Smoker  Smokeless Tobacco Never Used   Counseling given: Not Answered   Outpatient  Medications Prior to Visit  Medication Sig Dispense Refill  . abatacept (ORENCIA) 250 MG injection Inject into the vein once a week.     Marland Kitchen albuterol (PROAIR HFA) 108 (90 Base) MCG/ACT inhaler 2 puffs every 4 hours as needed only  if your can't catch your breath 1 g 1  . bimatoprost (LUMIGAN) 0.01 % SOLN As directed    . brimonidine (ALPHAGAN P) 0.1 % SOLN Place 1 drop into the left eye 2 (two) times daily.    . celecoxib (CELEBREX) 200 MG capsule Take 200 mg by mouth daily. Take 1 tablet every other day    . mometasone-formoterol (DULERA) 200-5 MCG/ACT AERO INHALE TWO PUFFS BY MOUTH EVERY MORNING AND THEN INHALE TWO PUFFS BY MOUTH DAILY 12 HOURS LATER 13 g 11  . venlafaxine (EFFEXOR) 37.5 MG tablet Take 37.5 mg by mouth daily.     No facility-administered medications prior to visit.     Review of  Systems:   Constitutional:   No  weight loss, night sweats,  Fevers, chills,  +fatigue, or  lassitude.  HEENT:   No headaches,  Difficulty swallowing,  Tooth/dental problems, or  Sore throat,                No sneezing, itching, ear ache, nasal congestion, post nasal drip,   CV:  No chest pain,  Orthopnea, PND, swelling in lower extremities, anasarca, dizziness, palpitations, syncope.   GI  No heartburn, indigestion, abdominal pain, nausea, vomiting, diarrhea, change in bowel habits, loss of appetite, bloody stools.   Resp:    No chest wall deformity  Skin: no rash or lesions.  GU: no dysuria, change in color of urine, no urgency or frequency.  No flank pain, no hematuria   MS:  + chronic joint pain     Physical Exam  BP 110/64 (BP Location: Left Arm, Cuff Size: Normal)   Pulse 77   Temp 98.9 F (37.2 C) (Oral)   Ht 4\' 11"  (1.499 m)   Wt 143 lb (64.9 kg)   SpO2 97%   BMI 28.88 kg/m   GEN: A/Ox3; pleasant , NAD, well nourished    HEENT:  [place]/AT,   , NOSE-clear, THROAT-clear, no lesions, no postnasal drip or exudate noted.   NECK:  Supple w/ fair ROM; no JVD; normal carotid impulses w/o bruits; no thyromegaly or nodules palpated; no lymphadenopathy.    RESP  Clear  P & A; w/o, wheezes/ rales/ or rhonchi. no accessory muscle use, no dullness to percussion  CARD:  RRR, no m/r/g, no peripheral edema, pulses intact, no cyanosis or clubbing.  GI:   Soft & nt; nml bowel sounds; no organomegaly or masses detected.   Musco: Warm bil, arthritic arthritic changes of her hands  Neuro: alert, no focal deficits noted.    Skin: Warm, no lesions or rashes    Lab Results:  CBC  BNP No results found for: BNP  ProBNP No results found for: PROBNP  Imaging: No results found.    PFT Results Latest Ref Rng & Units 09/13/2018 07/13/2017 05/30/2015  FVC-Pre L 1.83 1.81 1.80  FVC-Predicted Pre % 63 62 60  FVC-Post L 1.84 1.92 1.86  FVC-Predicted Post % 63 65 62  Pre  FEV1/FVC % % 47 43 47  Post FEV1/FCV % % 48 44 48  FEV1-Pre L 0.85 0.78 0.85  FEV1-Predicted Pre % 37 33 35  FEV1-Post L 0.88 0.85 0.90  DLCO UNC% % 112 114 127  DLCO COR %  Predicted % 145 148 164  TLC L 4.83 4.71 -  TLC % Predicted % 112 109 -  RV % Predicted % 170 175 -    Lab Results  Component Value Date   NITRICOXIDE 11 05/01/2018        Assessment & Plan:   Severe persistent asthma in adult without complication Doing very well . No changes   Plan :  Patient Instructions  Continue on Dulera 2 puffs twice daily, rinse after use Albuterol inhaler as needed for wheezing or cough. Activity as tolerated Follow-up with Dr. Melvyn Novas in 6 months and as needed      Rheumatoid arthritis ([place]) RA- improved control on current regimen   Plan  Patient Instructions  Continue on Dulera 2 puffs twice daily, rinse after use Albuterol inhaler as needed for wheezing or cough. Activity as tolerated Follow-up with Dr. Melvyn Novas in 6 months and as needed         Rexene Edison, NP 04/23/2020

## 2020-04-23 NOTE — Patient Instructions (Signed)
Continue on Dulera 2 puffs twice daily, rinse after use Albuterol inhaler as needed for wheezing or cough. Activity as tolerated Follow-up with Dr. Melvyn Novas in 6 months and as needed

## 2020-04-23 NOTE — Assessment & Plan Note (Signed)
Doing very well . No changes   Plan :  Patient Instructions  Continue on Dulera 2 puffs twice daily, rinse after use Albuterol inhaler as needed for wheezing or cough. Activity as tolerated Follow-up with Dr. Melvyn Novas in 6 months and as needed

## 2020-04-23 NOTE — Assessment & Plan Note (Signed)
RA- improved control on current regimen   Plan  Patient Instructions  Continue on Dulera 2 puffs twice daily, rinse after use Albuterol inhaler as needed for wheezing or cough. Activity as tolerated Follow-up with Dr. Melvyn Novas in 6 months and as needed

## 2020-06-11 DIAGNOSIS — G5622 Lesion of ulnar nerve, left upper limb: Secondary | ICD-10-CM | POA: Insufficient documentation

## 2020-10-02 IMAGING — MR MR ABDOMEN WO/W CM
10 of 17 series · 26 of 48 positions shown · IV contrast (14cc multihance)
Comparison: CT scan 08/18/2018.

CLINICAL DATA: Left renal and hepatic lesions on previous CT scan.

EXAM:
MRI ABDOMEN WITHOUT AND WITH CONTRAST
TECHNIQUE: Multiplanar multisequence MR imaging of the abdomen was performed
both before and after the administration of intravenous contrast.
CONTRAST:  14mL MULTIHANCE GADOBENATE DIMEGLUMINE 529 MG/ML IV SOLN

[Series 3: cor haste · coronal · 5.0mm · 0.74mm/px · 2 of 40 slices shown]
[im 1/40]
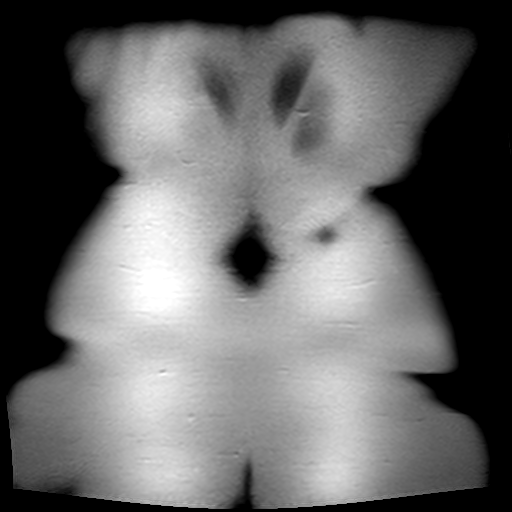
[im 40/40]
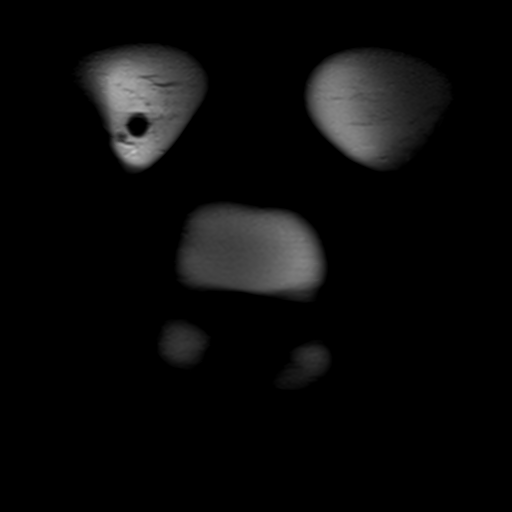

[Series 4: T1 · axial · 6.0mm · 0.74mm/px · z∈[-136,+154]mm · 4 of 90 slices shown]
[im 1/90]
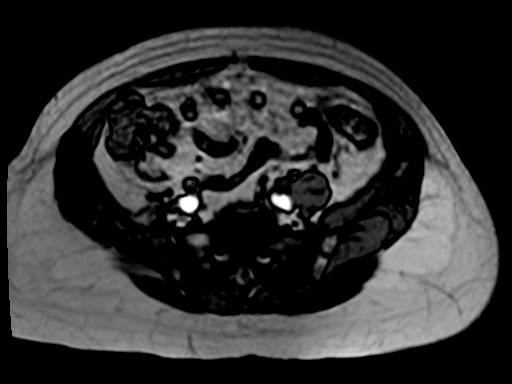
[im 30/90]
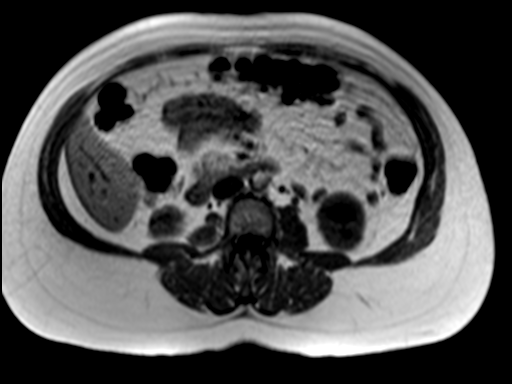
[im 60/90]
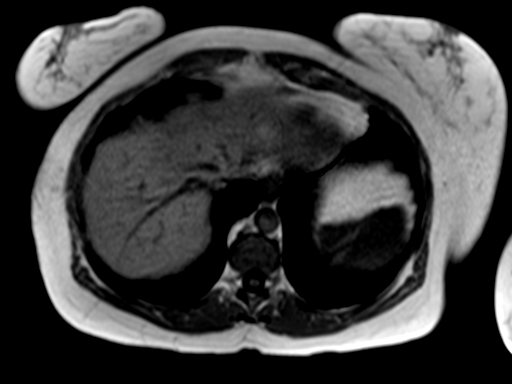
[im 90/90]
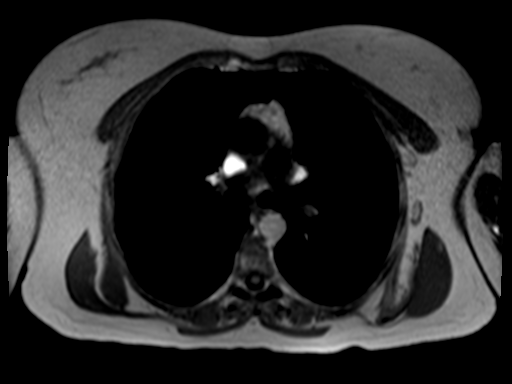

[Series 5: axial haste · axial · 6.0mm · 0.74mm/px · z∈[-88,+163]mm · 2 of 31 slices shown]
[im 1/31]
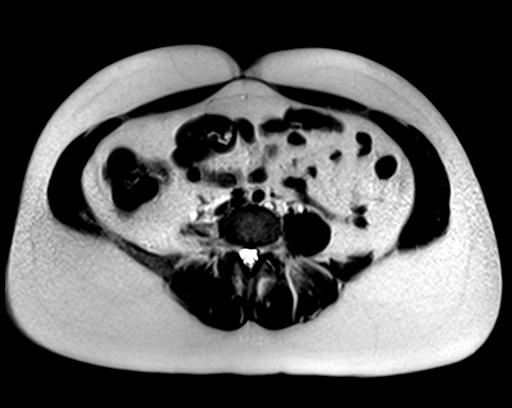
[im 31/31]
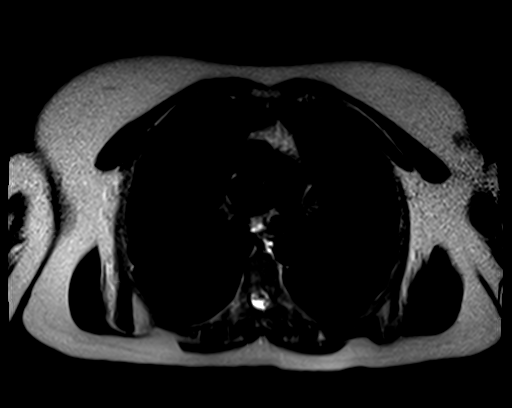

[Series 6: T2 · axial · 6.0mm · 1.12mm/px · z∈[-141,+176]mm · 2 of 45 slices shown]
[im 1/45]
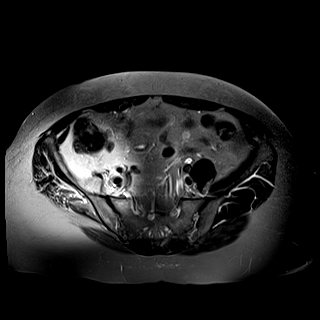
[im 45/45]
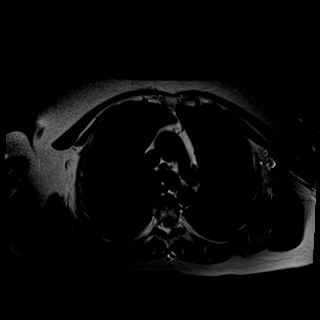

[Series 7: ep2d_diff_b50_500_800_p2_trig · axial · 6.0mm · 1.98mm/px · z∈[-128,+153]mm · 5 of 120 slices shown]
[im 1/120]
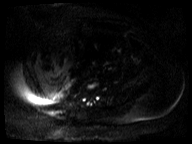
[im 30/120]
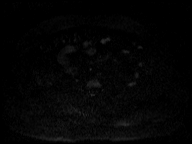
[im 60/120]
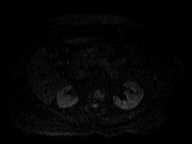
[im 90/120]
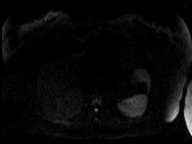
[im 120/120]
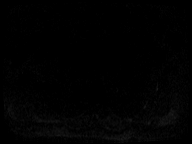

[Series 8: ep2d_[id number]_500_800_p2_trig_adc · axial · 6.0mm · 1.98mm/px · 1 of 40 slices shown]
[im 1/40]
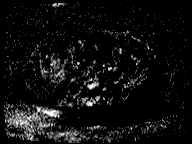

[Series 10: bSSFP · axial · 4.0mm · 0.74mm/px · z∈[-125,+151]mm · 2 of 70 slices shown]
[im 1/70]
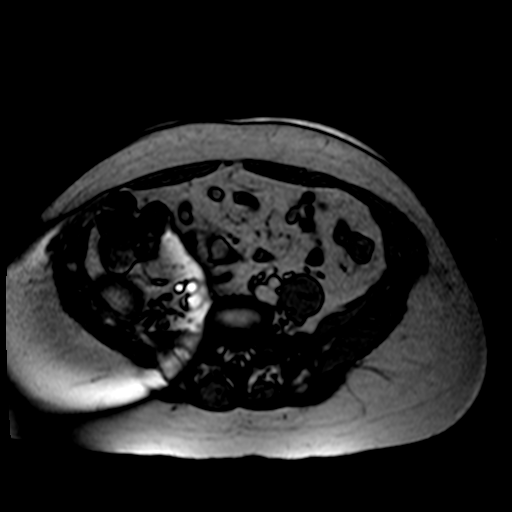
[im 70/70]
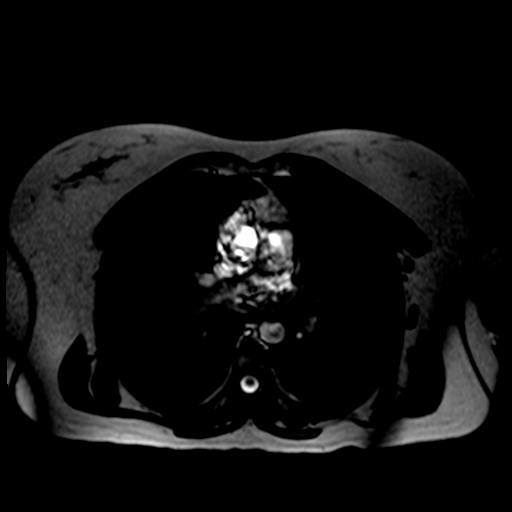

[Series 11: T1 dynamic · axial · non-contrast · 2.5mm · 0.74mm/px · z∈[-127,+150]mm · 3 of 112 slices shown]
[im 1/112]
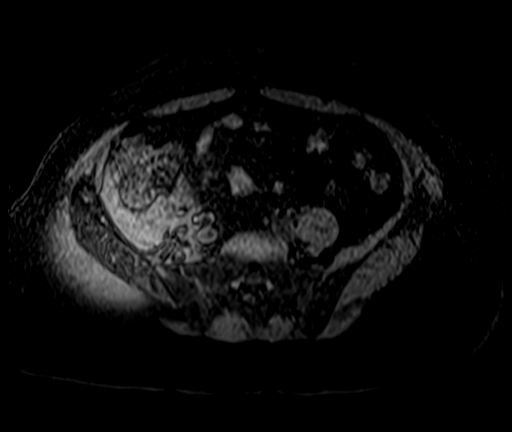
[im 56/112]
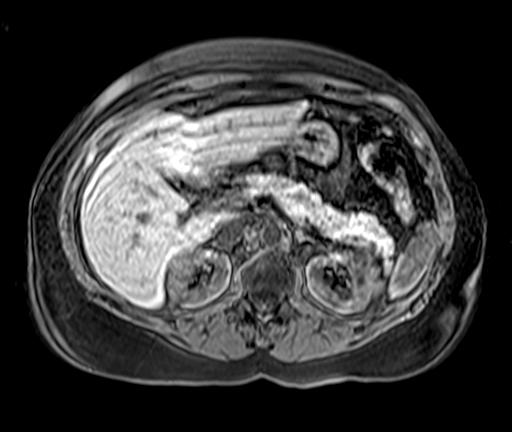
[im 112/112]
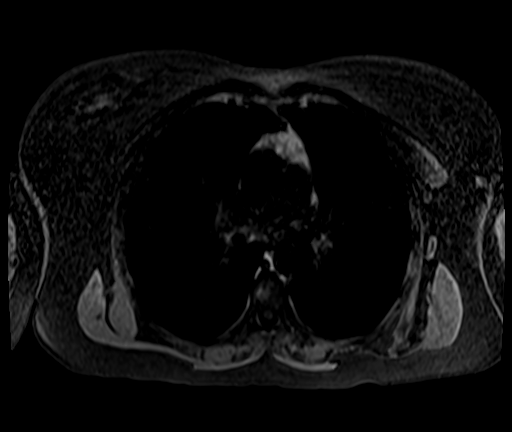

[Series 12: T1 dynamic post-contrast · axial · 2.5mm · 0.74mm/px · z∈[-127,+150]mm · 3 of 112 slices shown (1 of 2)]
[im 1/112]
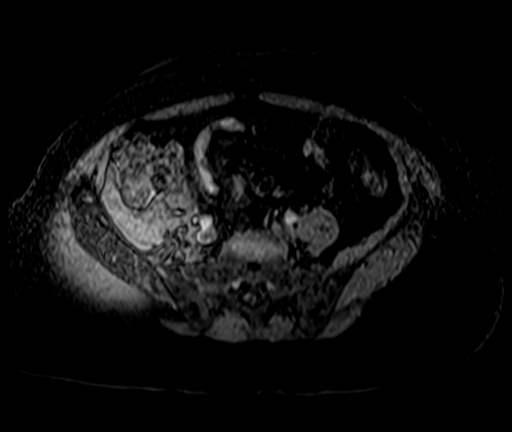
[im 56/112]
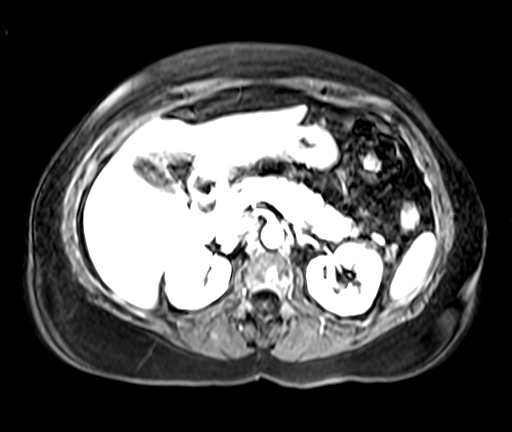
[im 112/112]
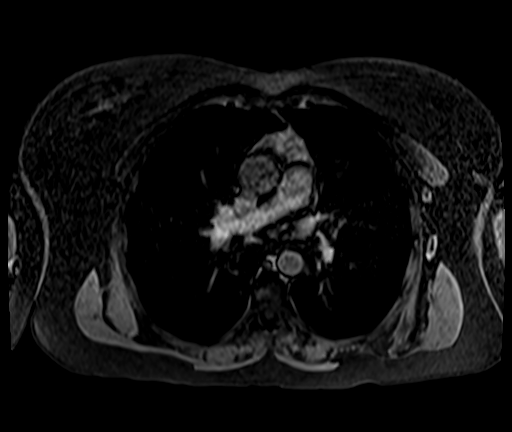

[Series 13: T1 dynamic post-contrast · axial · 2.5mm · 0.74mm/px · z∈[-127,+10]mm · 2 of 112 slices shown (2 of 2)]
[im 1/112]
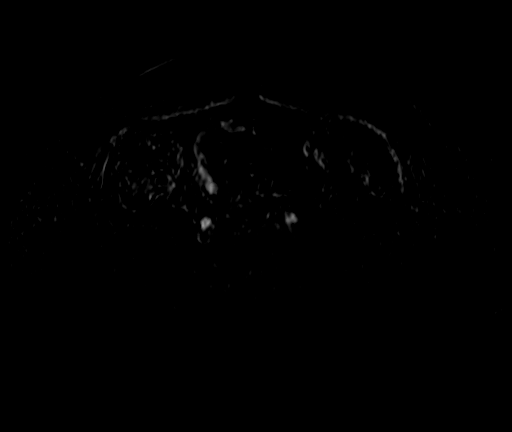
[im 56/112]
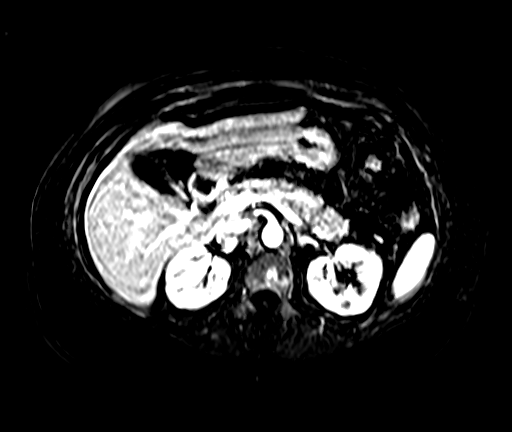

[26 of 48 positions shown; findings below may reference images not displayed]

FINDINGS: Lower chest: Unremarkable.

Hepatobiliary: 4.4 x 2.9 cm well-defined lesion in the lateral
segment left liver corresponds to the abnormality seen on previous
CT scan. This has homogeneous low signal intensity on T1 precontrast
imaging and mildly heterogeneous intermediate to high signal
intensity on T2 weighted imaging. Although motion degraded, imaging
after IV contrast administration, demonstrates a peripheral nodular
enhancement pattern, characteristic for benign hemangioma. Tiny
associated hepatic cysts evident.

There is no evidence for gallstones, gallbladder wall thickening, or
pericholecystic fluid. No intrahepatic or extrahepatic biliary
dilation.

Pancreas: Postcontrast imaging limited by motion degradation, but no
gross focal mass lesion. No dilatation of the main duct. No
intraparenchymal cyst. No peripancreatic edema.

Spleen:  No splenomegaly. No focal mass lesion.

Adrenals/Urinary Tract:  No adrenal nodule or mass.

Very tiny T2 hyperintense foci are identified in each kidney, most
compatible with cyst.

1.1 x 1.3 cm well-defined fairly homogeneous lesion is identified in
the upper pole of the right kidney. This lesion was in apparent on
previous noncontrast CT. Lesion demonstrates low signal intensity on
T2 weighted imaging and intermediate signal intensity on precontrast
T1. Imaging after IV contrast administration is substantially motion
degraded and delayed enhancement can not be excluded.

Lesion in question on previous CT in the upper pole left kidney is
well demonstrated on image 21 of axial T2 sequence 6. This 2.0 x
lesion appears to have a fluid fluid level and is well-defined.
Postcontrast assessment shows no early arterial phase
hyperenhancement, but the lesion is largely obscured on later phase
imaging.

Stomach/Bowel: Stomach is nondistended. No gastric wall thickening.
No evidence of outlet obstruction. Duodenum is normally positioned
as is the ligament of Treitz. No small bowel or colonic dilatation
within the visualized abdomen.

Vascular/Lymphatic: No abdominal aortic aneurysm. No abdominal
lymphadenopathy

Other:  No intraperitoneal free fluid.

Musculoskeletal: Hemangiomas noted within the vertebral anatomy
without suspicious marrow enhancement.
IMPRESSION: 1. 2.0 cm lesion upper pole left kidney on prior CT scan appears to
represent a complex cyst by MRI today given the well-defined margin
and fluid fluid level. However motion degradation on postcontrast
imaging hinders assessment and while no early arterial phase
enhancement is evident within the lesion, the more delayed
postcontrast sequences are essentially nondiagnostic. Given no
overtly suspicious features in this lesion on today's study,
consider surveillance to ensure stability.
2. 1.1 x 1.3 cm lesion identified in the upper pole right kidney was
inapparent on the previous noncontrast CT. Lesion is fairly
homogeneous and shows no early enhancement although the lesion is
obscured by motion artifact on later phase postcontrast imaging.
Given that this lesion was inapparent on noncontrast CT, follow-up
MRI or contrast infused CT (renal function permitting) recommended
to ensure stability.
3. As expected, multiple bilateral renal stone seen on previous CT
are not readily apparent by MRI.
[DATE] x 2.9 cm left hepatic mass has imaging features
characteristic for benign hemangioma. No further imaging follow-up
warranted.

## 2020-11-14 ENCOUNTER — Other Ambulatory Visit: Payer: Self-pay | Admitting: Internal Medicine

## 2020-11-17 DIAGNOSIS — H4042X3 Glaucoma secondary to eye inflammation, left eye, severe stage: Secondary | ICD-10-CM | POA: Diagnosis not present

## 2020-11-17 DIAGNOSIS — H4041X1 Glaucoma secondary to eye inflammation, right eye, mild stage: Secondary | ICD-10-CM | POA: Diagnosis not present

## 2020-11-25 DIAGNOSIS — J4530 Mild persistent asthma, uncomplicated: Secondary | ICD-10-CM | POA: Diagnosis not present

## 2020-11-25 DIAGNOSIS — K2270 Barrett's esophagus without dysplasia: Secondary | ICD-10-CM | POA: Diagnosis not present

## 2020-11-25 DIAGNOSIS — Z79899 Other long term (current) drug therapy: Secondary | ICD-10-CM | POA: Diagnosis not present

## 2020-11-25 DIAGNOSIS — E7800 Pure hypercholesterolemia, unspecified: Secondary | ICD-10-CM | POA: Diagnosis not present

## 2020-11-25 DIAGNOSIS — Z5181 Encounter for therapeutic drug level monitoring: Secondary | ICD-10-CM | POA: Diagnosis not present

## 2020-11-25 DIAGNOSIS — Z0000 Encounter for general adult medical examination without abnormal findings: Secondary | ICD-10-CM | POA: Diagnosis not present

## 2020-11-25 DIAGNOSIS — Z1389 Encounter for screening for other disorder: Secondary | ICD-10-CM | POA: Diagnosis not present

## 2020-11-25 DIAGNOSIS — M069 Rheumatoid arthritis, unspecified: Secondary | ICD-10-CM | POA: Diagnosis not present

## 2020-11-25 DIAGNOSIS — E221 Hyperprolactinemia: Secondary | ICD-10-CM | POA: Diagnosis not present

## 2020-11-25 DIAGNOSIS — M818 Other osteoporosis without current pathological fracture: Secondary | ICD-10-CM | POA: Diagnosis not present

## 2020-11-25 DIAGNOSIS — M542 Cervicalgia: Secondary | ICD-10-CM | POA: Diagnosis not present

## 2020-11-25 LAB — BASIC METABOLIC PANEL
BUN: 24 — AB (ref 4–21)
CO2: 31 — AB (ref 13–22)
Chloride: 101 (ref 99–108)
Creatinine: 0.9 (ref 0.5–1.1)
Glucose: 102
Potassium: 4 (ref 3.4–5.3)
Sodium: 143 (ref 137–147)

## 2020-11-25 LAB — CBC: RBC: 4.33 (ref 3.87–5.11)

## 2020-11-25 LAB — CBC AND DIFFERENTIAL
Hemoglobin: 13.4 (ref 12.0–16.0)
Neutrophils Absolute: 6.2
Platelets: 304 (ref 150–399)
WBC: 10.1

## 2020-11-25 LAB — COMPREHENSIVE METABOLIC PANEL
Albumin: 4.4 (ref 3.5–5.0)
Calcium: 10.3 (ref 8.7–10.7)

## 2020-11-25 LAB — LIPID PANEL
Cholesterol: 255 — AB (ref 0–200)
HDL: 58 (ref 35–70)
LDL Cholesterol: 148
LDl/HDL Ratio: 4.4
Triglycerides: 270 — AB (ref 40–160)

## 2020-11-25 LAB — HEPATIC FUNCTION PANEL
ALT: 16 (ref 7–35)
AST: 14 (ref 13–35)
Alkaline Phosphatase: 75 (ref 25–125)
Bilirubin, Total: 0.5

## 2020-11-25 LAB — TSH: TSH: 2.06 (ref 0.41–5.90)

## 2020-11-26 ENCOUNTER — Other Ambulatory Visit: Payer: Self-pay | Admitting: Internal Medicine

## 2020-11-26 DIAGNOSIS — M810 Age-related osteoporosis without current pathological fracture: Secondary | ICD-10-CM

## 2020-12-03 DIAGNOSIS — Z8349 Family history of other endocrine, nutritional and metabolic diseases: Secondary | ICD-10-CM | POA: Diagnosis not present

## 2020-12-03 DIAGNOSIS — E221 Hyperprolactinemia: Secondary | ICD-10-CM | POA: Diagnosis not present

## 2020-12-03 DIAGNOSIS — Z8639 Personal history of other endocrine, nutritional and metabolic disease: Secondary | ICD-10-CM | POA: Diagnosis not present

## 2020-12-03 DIAGNOSIS — M069 Rheumatoid arthritis, unspecified: Secondary | ICD-10-CM | POA: Diagnosis not present

## 2020-12-12 DIAGNOSIS — Z8639 Personal history of other endocrine, nutritional and metabolic disease: Secondary | ICD-10-CM | POA: Diagnosis not present

## 2020-12-12 DIAGNOSIS — M069 Rheumatoid arthritis, unspecified: Secondary | ICD-10-CM | POA: Diagnosis not present

## 2020-12-12 DIAGNOSIS — Z8349 Family history of other endocrine, nutritional and metabolic diseases: Secondary | ICD-10-CM | POA: Diagnosis not present

## 2020-12-12 DIAGNOSIS — E221 Hyperprolactinemia: Secondary | ICD-10-CM | POA: Diagnosis not present

## 2020-12-15 ENCOUNTER — Other Ambulatory Visit: Payer: Self-pay | Admitting: Internal Medicine

## 2020-12-15 DIAGNOSIS — Z86018 Personal history of other benign neoplasm: Secondary | ICD-10-CM

## 2020-12-15 DIAGNOSIS — E221 Hyperprolactinemia: Secondary | ICD-10-CM

## 2020-12-24 DIAGNOSIS — L821 Other seborrheic keratosis: Secondary | ICD-10-CM | POA: Diagnosis not present

## 2020-12-24 DIAGNOSIS — L814 Other melanin hyperpigmentation: Secondary | ICD-10-CM | POA: Diagnosis not present

## 2020-12-24 DIAGNOSIS — H4041X1 Glaucoma secondary to eye inflammation, right eye, mild stage: Secondary | ICD-10-CM | POA: Diagnosis not present

## 2020-12-24 DIAGNOSIS — D225 Melanocytic nevi of trunk: Secondary | ICD-10-CM | POA: Diagnosis not present

## 2020-12-24 DIAGNOSIS — L578 Other skin changes due to chronic exposure to nonionizing radiation: Secondary | ICD-10-CM | POA: Diagnosis not present

## 2020-12-24 DIAGNOSIS — D2261 Melanocytic nevi of right upper limb, including shoulder: Secondary | ICD-10-CM | POA: Diagnosis not present

## 2020-12-24 DIAGNOSIS — H4042X3 Glaucoma secondary to eye inflammation, left eye, severe stage: Secondary | ICD-10-CM | POA: Diagnosis not present

## 2020-12-24 DIAGNOSIS — D2272 Melanocytic nevi of left lower limb, including hip: Secondary | ICD-10-CM | POA: Diagnosis not present

## 2021-01-06 ENCOUNTER — Other Ambulatory Visit: Payer: Medicare Other

## 2021-01-06 ENCOUNTER — Ambulatory Visit
Admission: RE | Admit: 2021-01-06 | Discharge: 2021-01-06 | Disposition: A | Discharge status: Home / Self Care | Payer: Medicare Other | Source: Ambulatory Visit | Attending: Internal Medicine | Admitting: Internal Medicine

## 2021-01-06 ENCOUNTER — Other Ambulatory Visit: Payer: Self-pay

## 2021-01-06 DIAGNOSIS — E221 Hyperprolactinemia: Secondary | ICD-10-CM

## 2021-01-06 DIAGNOSIS — I639 Cerebral infarction, unspecified: Secondary | ICD-10-CM | POA: Diagnosis not present

## 2021-01-06 DIAGNOSIS — G9389 Other specified disorders of brain: Secondary | ICD-10-CM | POA: Diagnosis not present

## 2021-01-06 DIAGNOSIS — Z86018 Personal history of other benign neoplasm: Secondary | ICD-10-CM

## 2021-01-06 MED ORDER — GADOBENATE DIMEGLUMINE 529 MG/ML IV SOLN
10.0000 mL | Freq: Once | INTRAVENOUS | Status: AC | PRN
  Administered 2021-01-06: 10 mL via INTRAVENOUS

## 2021-01-08 ENCOUNTER — Other Ambulatory Visit: Payer: Self-pay

## 2021-01-08 ENCOUNTER — Ambulatory Visit (INDEPENDENT_AMBULATORY_CARE_PROVIDER_SITE_OTHER): Payer: Medicare Other | Admitting: Family Medicine

## 2021-01-08 ENCOUNTER — Encounter (INDEPENDENT_AMBULATORY_CARE_PROVIDER_SITE_OTHER): Payer: Self-pay | Admitting: Family Medicine

## 2021-01-08 VITALS — BP 101/68 | HR 79 | Temp 98.4°F | Ht 59.0 in | Wt 148.0 lb

## 2021-01-08 DIAGNOSIS — M5430 Sciatica, unspecified side: Secondary | ICD-10-CM

## 2021-01-08 DIAGNOSIS — R0602 Shortness of breath: Secondary | ICD-10-CM

## 2021-01-08 DIAGNOSIS — K2100 Gastro-esophageal reflux disease with esophagitis, without bleeding: Secondary | ICD-10-CM | POA: Diagnosis not present

## 2021-01-08 DIAGNOSIS — N200 Calculus of kidney: Secondary | ICD-10-CM | POA: Diagnosis not present

## 2021-01-08 DIAGNOSIS — F419 Anxiety disorder, unspecified: Secondary | ICD-10-CM | POA: Diagnosis not present

## 2021-01-08 DIAGNOSIS — R0683 Snoring: Secondary | ICD-10-CM | POA: Diagnosis not present

## 2021-01-08 DIAGNOSIS — F32A Depression, unspecified: Secondary | ICD-10-CM | POA: Diagnosis not present

## 2021-01-08 DIAGNOSIS — D352 Benign neoplasm of pituitary gland: Secondary | ICD-10-CM | POA: Diagnosis not present

## 2021-01-08 DIAGNOSIS — E669 Obesity, unspecified: Secondary | ICD-10-CM | POA: Diagnosis not present

## 2021-01-08 DIAGNOSIS — E559 Vitamin D deficiency, unspecified: Secondary | ICD-10-CM

## 2021-01-08 DIAGNOSIS — Z6830 Body mass index (BMI) 30.0-30.9, adult: Secondary | ICD-10-CM

## 2021-01-08 DIAGNOSIS — R5383 Other fatigue: Secondary | ICD-10-CM | POA: Diagnosis not present

## 2021-01-08 DIAGNOSIS — E7849 Other hyperlipidemia: Secondary | ICD-10-CM

## 2021-01-08 DIAGNOSIS — J45909 Unspecified asthma, uncomplicated: Secondary | ICD-10-CM | POA: Diagnosis not present

## 2021-01-08 DIAGNOSIS — H4089 Other specified glaucoma: Secondary | ICD-10-CM

## 2021-01-08 DIAGNOSIS — M0800 Unspecified juvenile rheumatoid arthritis of unspecified site: Secondary | ICD-10-CM

## 2021-01-08 DIAGNOSIS — Z0289 Encounter for other administrative examinations: Secondary | ICD-10-CM

## 2021-01-14 NOTE — Progress Notes (Signed)
Dear Dr. Laurann Montana,   Thank you for referring Kayla [name] to our clinic. The following note includes my evaluation and treatment recommendations.  Chief Complaint:   OBESITY Kayla [name] (MR# [id number]) is a 55 y.o. female who presents for evaluation and treatment of obesity and related comorbidities. Current BMI is Body mass index is 29.89 kg/m. Kayla [name] has been struggling with Kayla [name] weight for many years and has been unsuccessful in either losing weight, maintaining weight loss, or reaching Kayla [name] healthy weight goal.  Kayla [name] is currently in the action stage of change and ready to dedicate time achieving and maintaining a healthier weight. Kayla [name] is interested in becoming our patient and working on intensive lifestyle modifications including (but not limited to) diet and exercise for weight loss.  Kayla [name] lost weight at 1200 calories (Kayla [name] RMR today is < 1000).  Weight Watchers was helpful.  She craves sugar. She sees a Market researcher at [employer].  Accountability is important to Kayla [name].  Kayla [name] habits were reviewed today and are as follows: Kayla [name] family eats meals together, she thinks Kayla [name] family will eat healthier with Kayla [name], Kayla [name] desired weight loss is 30-35 pounds, she started gaining weight in Kayla [name] 81s, Kayla [name] heaviest weight ever was 152 pounds, she craves sugar, sweets, carbs, meat, guacomole, salsa, chips, coffee drinks, a good burger, sushi, [place] food, she snacks frequently in the evenings, she is frequently drinking liquids with calories, she frequently makes poor food choices, she frequently eats larger portions than normal and she struggles with emotional eating.  Depression Screen Kayla [name] Food and Mood (modified PHQ-9) score was 8.  Depression screen PHQ 2/9 01/08/2021  Decreased Interest 1  Down, Depressed, Hopeless 1  PHQ - 2 Score 2  Altered sleeping 0  Tired, decreased energy 3  Change in appetite 2  Feeling bad or failure about yourself  1  Trouble concentrating 0  Moving  slowly or fidgety/restless 0  Suicidal thoughts 0  PHQ-9 Score 8  Difficult doing work/chores Not difficult at all   Assessment/Plan:   1. Other fatigue [name] denies daytime somnolence and denies waking up still tired. Patent has a history of symptoms of snoring. [name] generally gets 8 or 9 hours of sleep per night, and states that she has generally restful sleep. Snoring is present. Apneic episodes are not present. Epworth Sleepiness Score is 3.  [name] does feel that Kayla [name] weight is causing Kayla [name] energy to be lower than it should be. Fatigue may be related to obesity, depression or many other causes. Labs will be ordered, and in the meanwhile, Kayla [name] will focus on self care including making healthy food choices and focusing on stress reduction.  - EKG 12-Lead  2. SOB (shortness of breath) on exertion Kayla [name] notes increasing shortness of breath with exercising and seems to be worsening over time with weight gain. She notes getting out of breath sooner with activity than she used to. This has gotten worse recently. Kayla [name] denies shortness of breath at rest or orthopnea.  Kayla [name] does feel that she gets out of breath more easily that she used to when she exercises. Kayla [name] shortness of breath appears to be obesity related and exercise induced. She has agreed to work on weight loss and gradually increase exercise to treat Kayla [name] exercise induced shortness of breath. Will continue to monitor closely.  3. Kayla [name] (juvenile rheumatoid arthritis) (HCC) Kayla [name] is followed by Rheumatology. We will continue to monitor symptoms as they relate to Kayla [name] weight loss journey.  4.  Other hyperlipidemia Lipid-lowering medications: None.   Plan: Dietary changes: Increase soluble fiber, decrease simple carbohydrates, decrease saturated fat. Exercise changes: Moderate to vigorous-intensity aerobic activity 150 minutes per week or as tolerated. We will continue to monitor along with PCP/specialists as it pertains to Kayla [name] weight loss  journey.  5. Pituitary adenoma, with hyperprolactinemia  Kayla [name] had an MRI performed yesterday that showed an increase in size of the pituitary adenoma. We will continue to monitor symptoms as they relate to Kayla [name] weight loss journey.  6. Gastroesophageal reflux disease with esophagitis, unspecified whether hemorrhage Kayla [name] takes Nexium 20 mg daily for GERD.  Plan:  Continue Nexium.  Avoid triggers.  We reviewed the diagnosis of GERD and importance of treatment. We discussed "red flag" symptoms and the importance of follow up if symptoms persisted despite treatment. We reviewed non-pharmacologic management of GERD symptoms: including: caffeine reduction, dietary changes, elevate HOB, NPO after supper, reduction of alcohol intake, tobacco cessation, and weight loss.  7. Asthma, moderate persistent Kayla [name] has a Dulera inhaler for Kayla [name] asthma.  8. Kidney stones History of. Will monitor symptoms and avoid medications that may increase Kayla [name] risk.  9. Snores Kayla [name] endorses snoring.  Epworth sleepiness score is 3.  10. Sciatica We will continue to monitor symptoms as they relate to Kayla [name] weight loss journey.  11. Other specified glaucoma Followed by Ophthalmology. Will be mindful of medication interactions.   12. Vitamin D deficiency Optimal goal > 50 ng/dL. Plan: Labs today.  13. Anxiety and depression, with emotional eating Not at goal. Medication: Effexor 37.5 mg daily.  [name] eats when stressed, when sad, as a reward, when bored, and for comfort.  Plan:  Behavior modification techniques were discussed today to help deal with emotional/non-hunger eating behaviors.  14. Class 1 obesity with serious comorbidity and body mass index (BMI) of 30.0 to 30.9 in adult, unspecified obesity type  Kayla [name] is currently in the action stage of change and Kayla [name] goal is to continue with weight loss efforts. I recommend Kayla [name] begin the structured treatment plan as follows:  She has agreed to the Category 1  Plan.  Exercise goals: As is.   Behavioral modification strategies: increasing lean protein intake, decreasing simple carbohydrates, increasing vegetables, increasing water intake, decreasing liquid calories, decreasing alcohol intake and decreasing sodium intake.  She was informed of the importance of frequent follow-up visits to maximize Kayla [name] success with intensive lifestyle modifications for Kayla [name] multiple health conditions. She was informed we would discuss Kayla [name] lab results at Kayla [name] next visit unless there is a critical issue that needs to be addressed sooner. Kayla [name] agreed to keep Kayla [name] next visit at the agreed upon time to discuss these results.  Objective:   Blood pressure 101/68, pulse 79, temperature 98.4 F (36.9 C), temperature source Oral, height 4\' 11"  (1.499 m), weight 148 lb (67.1 kg), SpO2 96 %. Body mass index is 29.89 kg/m.  EKG: Normal sinus rhythm, rate 88 bpm.  Indirect Calorimeter completed today shows a VO2 of 138 and a REE of 962.  Kayla [name] calculated basal metabolic rate is 0093 thus Kayla [name] basal metabolic rate is worse than expected.  General: Cooperative, alert, well developed, in no acute distress. HEENT: Conjunctivae and lids unremarkable. Cardiovascular: Regular rhythm.  Lungs: Normal work of breathing. Neurologic: No focal deficits.   Lab Results  Component Value Date   CREATININE 0.8 04/17/2008   BUN 22 04/17/2008   NA 142 04/17/2008   K 3.9 04/17/2008   CL 107 04/17/2008   Lab Results  Component Value Date   HGB 13.6 04/17/2008   HCT 40.0 04/17/2008   Obesity Behavioral Intervention:   Approximately 15 minutes were spent on the discussion below.  ASK: We discussed the diagnosis of obesity with Kayla [name] today and Chrisy agreed to give [place] permission to discuss obesity behavioral modification therapy today.  ASSESS: Jannat has the diagnosis of obesity and Kayla [name] BMI today is 29.9. Chyler is in the action stage of change.   ADVISE: Madisson was educated on the multiple  health risks of obesity as well as the benefit of weight loss to improve Kayla [name] health. She was advised of the need for long term treatment and the importance of lifestyle modifications to improve Kayla [name] current health and to decrease Kayla [name] risk of future health problems.  AGREE: Multiple dietary modification options and treatment options were discussed and Nainika agreed to follow the recommendations documented in the above note.  ARRANGE: Carmalita was educated on the importance of frequent visits to treat obesity as outlined per CMS and USPSTF guidelines and agreed to schedule Kayla [name] next follow up appointment today.  Attestation Statements:   This is the patient's first visit at [facility]. The patient's NEW PATIENT PACKET was reviewed at length. Included in the packet: current and past health history, medications, allergies, ROS, gynecologic history (women only), surgical history, family history, social history, weight history, weight loss surgery history (for those that have had weight loss surgery), nutritional evaluation, mood and food questionnaire, PHQ9, Epworth questionnaire, sleep habits questionnaire, patient life and health improvement goals questionnaire. These will all be scanned into the patient's chart under media.   During the visit, I independently reviewed the patient's EKG, bioimpedance scale results, and indirect calorimeter results. I used this information to tailor a meal plan for the patient that will help Kayla [name] to lose weight and will improve Kayla [name] obesity-related conditions going forward. I performed a medically necessary appropriate examination and/or evaluation. I discussed the assessment and treatment plan with the patient. The patient was provided an opportunity to ask questions and all were answered. The patient agreed with the plan and demonstrated an understanding of the instructions. Labs were ordered at this visit and will be reviewed at the next visit unless more  critical results need to be addressed immediately. Clinical information was updated and documented in the EMR.   I, Water quality scientist, CMA, am acting as transcriptionist for [name], DO  I have reviewed the above documentation for accuracy and completeness, and I agree with the above. [name], DO

## 2021-01-22 ENCOUNTER — Encounter (INDEPENDENT_AMBULATORY_CARE_PROVIDER_SITE_OTHER): Payer: Self-pay | Admitting: Family Medicine

## 2021-01-22 ENCOUNTER — Ambulatory Visit (INDEPENDENT_AMBULATORY_CARE_PROVIDER_SITE_OTHER): Payer: Medicare Other | Admitting: Family Medicine

## 2021-01-22 ENCOUNTER — Other Ambulatory Visit: Payer: Self-pay

## 2021-01-22 VITALS — BP 108/70 | HR 89 | Temp 98.0°F | Ht 59.0 in | Wt 144.0 lb

## 2021-01-22 DIAGNOSIS — R632 Polyphagia: Secondary | ICD-10-CM | POA: Diagnosis not present

## 2021-01-22 DIAGNOSIS — E669 Obesity, unspecified: Secondary | ICD-10-CM | POA: Diagnosis not present

## 2021-01-22 DIAGNOSIS — K2100 Gastro-esophageal reflux disease with esophagitis, without bleeding: Secondary | ICD-10-CM | POA: Diagnosis not present

## 2021-01-22 DIAGNOSIS — Z6830 Body mass index (BMI) 30.0-30.9, adult: Secondary | ICD-10-CM | POA: Diagnosis not present

## 2021-01-22 DIAGNOSIS — G894 Chronic pain syndrome: Secondary | ICD-10-CM | POA: Diagnosis not present

## 2021-01-23 DIAGNOSIS — K219 Gastro-esophageal reflux disease without esophagitis: Secondary | ICD-10-CM | POA: Diagnosis not present

## 2021-01-23 DIAGNOSIS — M542 Cervicalgia: Secondary | ICD-10-CM | POA: Diagnosis not present

## 2021-01-26 MED ORDER — DEXLANSOPRAZOLE 60 MG PO CPDR: 60.0000 mg | DELAYED_RELEASE_CAPSULE | Freq: Every day | ORAL | 1 refills | Status: DC

## 2021-01-26 MED ORDER — BACLOFEN 10 MG PO TABS: 10.0000 mg | ORAL_TABLET | Freq: Three times a day (TID) | ORAL | 0 refills | Status: DC

## 2021-01-26 NOTE — Progress Notes (Signed)
Chief Complaint:   OBESITY Kayla [name] is here to discuss her progress with her obesity treatment plan along with follow-up of her obesity related diagnoses.   Today's visit was #: 2 Starting weight: 148 lbs Starting date: 01/08/2021 Today's weight: 144 lbs Today's date: 01/22/2021 Total lbs lost to date: 4 lbs Body mass index is 29.08 kg/m.  Total weight loss percentage to date: -2.70%  Interim History:  [name] says she has been irritable.  Her reflux is not better yet.  She endorses polyphagia.  Denies constipation.  She has been getting around 1200 calories and 95 grams of protein per day, per MFP.  Current Meal Plan: the Category 1 Plan for 0% of the time.  Current Exercise Plan: Movement strengthening for 30 minutes 2 times per week.  Assessment/Plan:   1. Gastroesophageal reflux disease with esophagitis, unspecified whether hemorrhage We reviewed the diagnosis of GERD and importance of treatment. We discussed "red flag" symptoms and the importance of follow up if symptoms persisted despite treatment. We reviewed non-pharmacologic management of GERD symptoms: including: caffeine reduction, dietary changes, elevate HOB, NPO after supper, reduction of alcohol intake, tobacco cessation, and weight loss.  Plan:  Start Dexilant 60 mg daily for GERD.  - Start dexlansoprazole (DEXILANT) 60 MG capsule; Take 1 capsule (60 mg total) by mouth daily.  Dispense: 30 capsule; Refill: 1  2. Chronic pain syndrome Will start baclofen 10 mg three times daily to help with chronic pain.  - Start baclofen (LIORESAL) 10 MG tablet; Take 1 tablet (10 mg total) by mouth 3 (three) times daily.  Dispense: 30 each; Refill: 0  3. Polyphagia Not at goal. Current treatment: None. Polyphagia refers to excessive feelings of hunger. She will continue to focus on protein-rich, low simple carbohydrate foods. We reviewed the importance of hydration, regular exercise for stress reduction, and restorative sleep.  4.  Obesity, current BMI 29.2  Course: Kayla [name] is currently in the action stage of change. As such, her goal is to continue with weight loss efforts.   Nutrition goals: She has agreed to the Category 1 Plan.  Dairy-free plan.  Cheese okay.  Exercise goals: As is.  Behavioral modification strategies: increasing lean protein intake, decreasing simple carbohydrates, increasing vegetables and increasing water intake.  Kayla [name] has agreed to follow-up with our clinic in 3 weeks. She was informed of the importance of frequent follow-up visits to maximize her success with intensive lifestyle modifications for her multiple health conditions.   Objective:   Blood pressure 108/70, pulse 89, temperature 98 F (36.7 C), temperature source Oral, height 4\' 11"  (1.499 m), weight 144 lb (65.3 kg), SpO2 96 %. Body mass index is 29.08 kg/m.  General: Cooperative, alert, well developed, in no acute distress. HEENT: Conjunctivae and lids unremarkable. Cardiovascular: Regular rhythm.  Lungs: Normal work of breathing. Neurologic: No focal deficits.   Lab Results  Component Value Date   CREATININE 0.8 04/17/2008   BUN 22 04/17/2008   NA 142 04/17/2008   K 3.9 04/17/2008   CL 107 04/17/2008   Lab Results  Component Value Date   HGB 13.6 04/17/2008   HCT 40.0 04/17/2008   Obesity Behavioral Intervention:   Approximately 15 minutes were spent on the discussion below.  ASK: We discussed the diagnosis of obesity with Kayla [name] today and Kayla [name] agreed to give Kayla [name] permission to discuss obesity behavioral modification therapy today.  ASSESS: Kayla [name] has the diagnosis of obesity and her BMI today is 29.2. Kayla [name] is in the action  stage of change.   ADVISE: Kayla [name] was educated on the multiple health risks of obesity as well as the benefit of weight loss to improve her health. She was advised of the need for long term treatment and the importance of lifestyle modifications to improve her current health and to decrease  her risk of future health problems.  AGREE: Multiple dietary modification options and treatment options were discussed and Kayla [name] agreed to follow the recommendations documented in the above note.  ARRANGE: Kayla [name] was educated on the importance of frequent visits to treat obesity as outlined per CMS and USPSTF guidelines and agreed to schedule her next follow up appointment today.  Attestation Statements:   Reviewed by clinician on day of visit: allergies, medications, problem list, medical history, surgical history, family history, social history, and previous encounter notes.  I, Water quality scientist, CMA, am acting as transcriptionist for [name], DO  I have reviewed the above documentation for accuracy and completeness, and I agree with the above. [name], DO

## 2021-01-27 DIAGNOSIS — D352 Benign neoplasm of pituitary gland: Secondary | ICD-10-CM | POA: Diagnosis not present

## 2021-01-27 DIAGNOSIS — Z8349 Family history of other endocrine, nutritional and metabolic diseases: Secondary | ICD-10-CM | POA: Diagnosis not present

## 2021-01-27 DIAGNOSIS — E221 Hyperprolactinemia: Secondary | ICD-10-CM | POA: Diagnosis not present

## 2021-01-27 DIAGNOSIS — M069 Rheumatoid arthritis, unspecified: Secondary | ICD-10-CM | POA: Diagnosis not present

## 2021-02-11 DIAGNOSIS — H524 Presbyopia: Secondary | ICD-10-CM | POA: Diagnosis not present

## 2021-02-11 DIAGNOSIS — H5203 Hypermetropia, bilateral: Secondary | ICD-10-CM | POA: Diagnosis not present

## 2021-02-12 ENCOUNTER — Ambulatory Visit (INDEPENDENT_AMBULATORY_CARE_PROVIDER_SITE_OTHER): Payer: Medicare Other | Admitting: Family Medicine

## 2021-02-12 ENCOUNTER — Encounter (INDEPENDENT_AMBULATORY_CARE_PROVIDER_SITE_OTHER): Payer: Self-pay | Admitting: Family Medicine

## 2021-02-12 ENCOUNTER — Other Ambulatory Visit: Payer: Self-pay

## 2021-02-12 VITALS — BP 100/65 | HR 84 | Temp 98.9°F | Ht 59.0 in | Wt 142.0 lb

## 2021-02-12 DIAGNOSIS — K2100 Gastro-esophageal reflux disease with esophagitis, without bleeding: Secondary | ICD-10-CM | POA: Diagnosis not present

## 2021-02-12 DIAGNOSIS — Z6830 Body mass index (BMI) 30.0-30.9, adult: Secondary | ICD-10-CM | POA: Diagnosis not present

## 2021-02-12 DIAGNOSIS — E559 Vitamin D deficiency, unspecified: Secondary | ICD-10-CM

## 2021-02-12 DIAGNOSIS — G894 Chronic pain syndrome: Secondary | ICD-10-CM

## 2021-02-12 DIAGNOSIS — R632 Polyphagia: Secondary | ICD-10-CM | POA: Diagnosis not present

## 2021-02-12 DIAGNOSIS — E669 Obesity, unspecified: Secondary | ICD-10-CM | POA: Diagnosis not present

## 2021-02-12 MED ORDER — PHENTERMINE HCL 37.5 MG PO TABS: ORAL_TABLET | ORAL | 0 refills | Status: DC

## 2021-02-15 NOTE — Progress Notes (Signed)
Chief Complaint:   OBESITY Kayla [name] is here to discuss her progress with her obesity treatment plan along with follow-up of her obesity related diagnoses.   Today's visit was #: 3 Starting weight: 148 lbs Starting date: 01/08/2021 Total lbs lost to date: 6 lbs Total weight loss percentage to date: -4.05%  Interim History: Kayla [name] says she is frustrated with the lack of scale movement.  She is maintaining 1000-1200 calories.  We reviewed [employer]itness [employer].  She says she is working with her movement disorder specialist.  Nutrition Plan: Category 1 Plan for 98% of the time. Activity: Movement Specialist for 30 minutes 2 times per week.  Assessment/Plan:   1. Polyphagia Not at goal. Current treatment: None. Polyphagia refers to excessive feelings of hunger. She will continue to focus on protein-rich, low simple carbohydrate foods. We reviewed the importance of hydration, regular exercise for stress reduction, and restorative sleep.  Plan:  After discussion, patient would like to start below medication. Expectations, risks, and potential side effects reviewed.   I have consulted the [place] Controlled Substances Registry for this patient, and feel the risk/benefit ratio today is favorable for proceeding with this prescription for a controlled substance. The patient understands monitoring parameters and red flags.   - Start phentermine (ADIPEX-P) 37.5 MG tablet; 1/2 to 1 tablet daily.  Dispense: 30 tablet; Refill: 0  2. Chronic pain syndrome Continue baclofen 10 mg three times daily as needed for pain. We will continue to monitor symptoms as they relate to her weight loss journey.  3. Vitamin D deficiency Kayla [name] is not currently taking a vitamin D supplement.  Optimal goal > 50 ng/dL.   Plan: Follow-up for routine testing of Vitamin D, at least 2-3 times per year to avoid over-replacement.  4. Gastroesophageal reflux disease with esophagitis, unspecified whether hemorrhage She is taking Dexilant  60 mg daily for her GERD symptoms.  Tolerating it well.  Plan:  Continue Dexilant.  We reviewed the diagnosis of GERD and importance of treatment. We discussed "red flag" symptoms and the importance of follow up if symptoms persisted despite treatment. We reviewed non-pharmacologic management of GERD symptoms: including: caffeine reduction, dietary changes, elevate HOB, NPO after supper, reduction of alcohol intake, tobacco cessation, and weight loss.  5. Obesity, current BMI 28.5  Course: Kayla [name] is currently in the action stage of change. As such, her goal is to continue with weight loss efforts.   Nutrition goals: She has agreed to keeping a food journal and adhering to recommended goals of 1000 calories and 85 grams of protein.   Exercise goals: As is.  Behavioral modification strategies: increasing lean protein intake, decreasing simple carbohydrates, increasing vegetables and increasing water intake.  Kayla [name] has agreed to follow-up with our clinic in 3 weeks. She was informed of the importance of frequent follow-up visits to maximize her success with intensive lifestyle modifications for her multiple health conditions.   Objective:   Blood pressure 100/65, pulse 84, temperature 98.9 F (37.2 C), height 4\' 11"  (1.499 m), weight 142 lb (64.4 kg), SpO2 97 %. Body mass index is 28.68 kg/m.  General: Cooperative, alert, well developed, in no acute distress. HEENT: Conjunctivae and lids unremarkable. Cardiovascular: Regular rhythm.  Lungs: Normal work of breathing. Neurologic: No focal deficits.   Lab Results  Component Value Date   CREATININE 0.8 04/17/2008   BUN 22 04/17/2008   NA 142 04/17/2008   K 3.9 04/17/2008   CL 107 04/17/2008   Lab Results  Component Value  Date   HGB 13.6 04/17/2008   HCT 40.0 04/17/2008   Obesity Behavioral Intervention:   Approximately 15 minutes were spent on the discussion below.  ASK: We discussed the diagnosis of obesity with Kayla [name] today  and Kayla [name] agreed to give [place] permission to discuss obesity behavioral modification therapy today.  ASSESS: Kayla [name] has the diagnosis of obesity and her BMI today is 28.5. [place] is in the action stage of change.   ADVISE: Kayla [name] was educated on the multiple health risks of obesity as well as the benefit of weight loss to improve her health. She was advised of the need for long term treatment and the importance of lifestyle modifications to improve her current health and to decrease her risk of future health problems.  AGREE: Multiple dietary modification options and treatment options were discussed and Kayla [name] agreed to follow the recommendations documented in the above note.  ARRANGE: Kayla [name] was educated on the importance of frequent visits to treat obesity as outlined per CMS and USPSTF guidelines and agreed to schedule her next follow up appointment today.  Attestation Statements:   Reviewed by clinician on day of visit: allergies, medications, problem list, medical history, surgical history, family history, social history, and previous encounter notes.  I, Water quality scientist, CMA, am acting as transcriptionist for [name], DO  I have reviewed the above documentation for accuracy and completeness, and I agree with the above. [name], DO

## 2021-03-09 ENCOUNTER — Encounter (INDEPENDENT_AMBULATORY_CARE_PROVIDER_SITE_OTHER): Payer: Self-pay | Admitting: Family Medicine

## 2021-03-09 ENCOUNTER — Other Ambulatory Visit: Payer: Self-pay

## 2021-03-09 ENCOUNTER — Ambulatory Visit (INDEPENDENT_AMBULATORY_CARE_PROVIDER_SITE_OTHER): Payer: Medicare Other | Admitting: Family Medicine

## 2021-03-09 VITALS — BP 109/73 | HR 89 | Temp 97.7°F | Ht 59.0 in | Wt 136.0 lb

## 2021-03-09 DIAGNOSIS — F3289 Other specified depressive episodes: Secondary | ICD-10-CM

## 2021-03-09 DIAGNOSIS — J4540 Moderate persistent asthma, uncomplicated: Secondary | ICD-10-CM | POA: Insufficient documentation

## 2021-03-09 DIAGNOSIS — I499 Cardiac arrhythmia, unspecified: Secondary | ICD-10-CM | POA: Insufficient documentation

## 2021-03-09 DIAGNOSIS — R1013 Epigastric pain: Secondary | ICD-10-CM

## 2021-03-09 DIAGNOSIS — R632 Polyphagia: Secondary | ICD-10-CM

## 2021-03-09 DIAGNOSIS — M810 Age-related osteoporosis without current pathological fracture: Secondary | ICD-10-CM | POA: Insufficient documentation

## 2021-03-09 DIAGNOSIS — F419 Anxiety disorder, unspecified: Secondary | ICD-10-CM | POA: Insufficient documentation

## 2021-03-09 DIAGNOSIS — E7800 Pure hypercholesterolemia, unspecified: Secondary | ICD-10-CM | POA: Insufficient documentation

## 2021-03-09 DIAGNOSIS — E669 Obesity, unspecified: Secondary | ICD-10-CM | POA: Diagnosis not present

## 2021-03-09 DIAGNOSIS — J4530 Mild persistent asthma, uncomplicated: Secondary | ICD-10-CM | POA: Insufficient documentation

## 2021-03-09 DIAGNOSIS — Z6830 Body mass index (BMI) 30.0-30.9, adult: Secondary | ICD-10-CM | POA: Diagnosis not present

## 2021-03-09 DIAGNOSIS — N200 Calculus of kidney: Secondary | ICD-10-CM | POA: Insufficient documentation

## 2021-03-09 DIAGNOSIS — K219 Gastro-esophageal reflux disease without esophagitis: Secondary | ICD-10-CM | POA: Insufficient documentation

## 2021-03-09 DIAGNOSIS — J45901 Unspecified asthma with (acute) exacerbation: Secondary | ICD-10-CM | POA: Insufficient documentation

## 2021-03-09 DIAGNOSIS — M889 Osteitis deformans of unspecified bone: Secondary | ICD-10-CM | POA: Insufficient documentation

## 2021-03-09 DIAGNOSIS — D352 Benign neoplasm of pituitary gland: Secondary | ICD-10-CM | POA: Insufficient documentation

## 2021-03-09 DIAGNOSIS — E559 Vitamin D deficiency, unspecified: Secondary | ICD-10-CM | POA: Insufficient documentation

## 2021-03-09 DIAGNOSIS — K2270 Barrett's esophagus without dysplasia: Secondary | ICD-10-CM | POA: Insufficient documentation

## 2021-03-09 DIAGNOSIS — K259 Gastric ulcer, unspecified as acute or chronic, without hemorrhage or perforation: Secondary | ICD-10-CM | POA: Insufficient documentation

## 2021-03-17 NOTE — Progress Notes (Signed)
Chief Complaint:   OBESITY Kayla [name] is here to discuss her progress with her obesity treatment plan along with follow-up of her obesity related diagnoses.   Today's visit was #: 4 Starting weight: 148 lbs Starting date: 01/08/2021 Today's weight: 136 lbs Today's date: 03/09/2021 Weight change since last visit: 8 lbs Total lbs lost to date: 12 lbs Body mass index is 27.47 kg/m.  Total weight loss percentage to date: -8.11%  Interim History: Kayla [name] says her reflux is better with Dexilant.  She is happy with her weight loss since starting Phentermine. Current Meal Plan: keeping a food journal and adhering to recommended goals of 1800 calories and 85 grams of protein for 99% of the time.  Current Exercise Plan: movement therapy 2 times per week. Current Anti-Obesity Medications: phentermine 18.75-37.5 mg daily. Side effects: None.  Assessment/Plan:    1. Polyphagia At goal. Current treatment: phentermine 1/2 to 1 tablet daily. Polyphagia refers to excessive feelings of hunger. She will continue to focus on protein-rich, low simple carbohydrate foods. We reviewed the importance of hydration, regular exercise for stress reduction, and restorative sleep.  2. Dyspepsia Kayla [name] is taking Dexilant 60 mg daily for dyspepsia.  It is improving. Plan:  The current medical regimen is effective;  continue present plan and medications.  We reviewed the diagnosis of dyspepsia and importance of treatment. We discussed "red flag" symptoms and the importance of follow up if symptoms persisted despite treatment. We reviewed non-pharmacologic management of dyspepsia symptoms: including: caffeine reduction, dietary changes, elevate HOB, NPO after supper, reduction of alcohol intake, tobacco cessation, and weight loss.  3. Other depression, with emotional eating Controlled. Medication: Effexor-XR 37.5 mg daily.  Plan:  Discussed cues and consequences, how thoughts affect eating, model of thoughts, feelings,  and behaviors, and strategies for change by focusing on the cue. Discussed cognitive distortions, coping thoughts, and how to change your thoughts.  4. Obesity, current BMI 27.5  Course: Kayla [name] is currently in the action stage of change. As such, her goal is to continue with weight loss efforts.   Nutrition goals: She has agreed to keeping a food journal and adhering to recommended goals of 1000 calories and 85 grams of protein.   Exercise goals: For substantial health benefits, adults should do at least 150 minutes (2 hours and 30 minutes) a week of moderate-intensity, or 75 minutes (1 hour and 15 minutes) a week of vigorous-intensity aerobic physical activity, or an equivalent combination of moderate- and vigorous-intensity aerobic activity. Aerobic activity should be performed in episodes of at least 10 minutes, and preferably, it should be spread throughout the week.  Behavioral modification strategies: increasing lean protein intake, decreasing simple carbohydrates, increasing vegetables, and increasing water intake.  Kayla [name] has agreed to follow-up with our clinic in 2-3 weeks. She was informed of the importance of frequent follow-up visits to maximize her success with intensive lifestyle modifications for her multiple health conditions.   Objective:   Blood pressure 109/73, pulse 89, temperature 97.7 F (36.5 C), temperature source Oral, height 4\' 11"  (1.499 m), weight 136 lb (61.7 kg), SpO2 95 %. Body mass index is 27.47 kg/m.  General: Cooperative, alert, well developed, in no acute distress. HEENT: Conjunctivae and lids unremarkable. Cardiovascular: Regular rhythm.  Lungs: Normal work of breathing. Neurologic: No focal deficits.   Lab Results  Component Value Date   CREATININE 0.8 04/17/2008   BUN 22 04/17/2008   NA 142 04/17/2008   K 3.9 04/17/2008   CL 107 04/17/2008  Lab Results  Component Value Date   HGB 13.6 04/17/2008   HCT 40.0 04/17/2008   Obesity Behavioral  Intervention:   Approximately 15 minutes were spent on the discussion below.  ASK: We discussed the diagnosis of obesity with Kayla [name] today and Kayla [name] agreed to give [place] permission to discuss obesity behavioral modification therapy today.  ASSESS: Kayla [name] has the diagnosis of obesity and her BMI today is 27.5. Kayla [name] is in the action stage of change.   ADVISE: Kayla [name] was educated on the multiple health risks of obesity as well as the benefit of weight loss to improve her health. She was advised of the need for long term treatment and the importance of lifestyle modifications to improve her current health and to decrease her risk of future health problems.  AGREE: Multiple dietary modification options and treatment options were discussed and Kayla [name] agreed to follow the recommendations documented in the above note.  ARRANGE: Kayla [name] was educated on the importance of frequent visits to treat obesity as outlined per CMS and USPSTF guidelines and agreed to schedule her next follow up appointment today.  Attestation Statements:   Reviewed by clinician on day of visit: allergies, medications, problem list, medical history, surgical history, family history, social history, and previous encounter notes.  I, Water quality scientist, CMA, am acting as transcriptionist for [name], DO  I have reviewed the above documentation for accuracy and completeness, and I agree with the above. [name], DO

## 2021-03-25 DIAGNOSIS — H4041X1 Glaucoma secondary to eye inflammation, right eye, mild stage: Secondary | ICD-10-CM | POA: Diagnosis not present

## 2021-03-25 DIAGNOSIS — H4042X3 Glaucoma secondary to eye inflammation, left eye, severe stage: Secondary | ICD-10-CM | POA: Diagnosis not present

## 2021-03-30 ENCOUNTER — Encounter (INDEPENDENT_AMBULATORY_CARE_PROVIDER_SITE_OTHER): Payer: Self-pay | Admitting: Family Medicine

## 2021-03-30 ENCOUNTER — Ambulatory Visit (INDEPENDENT_AMBULATORY_CARE_PROVIDER_SITE_OTHER): Payer: Medicare Other | Admitting: Family Medicine

## 2021-03-30 ENCOUNTER — Other Ambulatory Visit: Payer: Self-pay

## 2021-03-30 VITALS — BP 102/70 | HR 77 | Temp 98.0°F | Ht 59.0 in | Wt 135.0 lb

## 2021-03-30 DIAGNOSIS — E669 Obesity, unspecified: Secondary | ICD-10-CM | POA: Diagnosis not present

## 2021-03-30 DIAGNOSIS — R1013 Epigastric pain: Secondary | ICD-10-CM

## 2021-03-30 DIAGNOSIS — Z6830 Body mass index (BMI) 30.0-30.9, adult: Secondary | ICD-10-CM

## 2021-03-30 DIAGNOSIS — R632 Polyphagia: Secondary | ICD-10-CM

## 2021-03-30 MED ORDER — DEXLANSOPRAZOLE 60 MG PO CPDR: 60.0000 mg | DELAYED_RELEASE_CAPSULE | Freq: Every day | ORAL | 1 refills | Status: DC

## 2021-03-30 MED ORDER — PHENTERMINE HCL 37.5 MG PO TABS: ORAL_TABLET | ORAL | 0 refills | Status: DC

## 2021-04-02 ENCOUNTER — Other Ambulatory Visit (INDEPENDENT_AMBULATORY_CARE_PROVIDER_SITE_OTHER): Payer: Self-pay | Admitting: Family Medicine

## 2021-04-07 NOTE — Progress Notes (Signed)
Chief Complaint:   OBESITY Kayla [name] is here to discuss her progress with her obesity treatment plan along with follow-up of her obesity related diagnoses.   Today's visit was #: 5 Starting weight: 148 lbs Starting date: 01/08/2021 Today's weight: 135 lbs Today's date: 03/30/2021 Weight change since last visit: 1 lb Total lbs lost to date: 13 lbs Body mass index is 27.27 kg/m.  Total weight loss percentage to date: -8.78%  Interim History: Kayla [name] went on vacation.  She says she is happy with her progress.  She says her daughter is out of the country for the next month, so she plans to e able to fully commit to the food.  Current Meal Plan: keeping a food journal and adhering to recommended goals of 1000 calories and 85 grams of protein for 60% of the time.  Current Exercise Plan: Movement therapy 2 times per week. Current Anti-Obesity Medications: phentermine 18.75 mg daily. Side effects: None.  Assessment/Plan:   Meds ordered this encounter  Medications   dexlansoprazole (DEXILANT) 60 MG capsule    Sig: Take 1 capsule (60 mg total) by mouth daily.    Dispense:  30 capsule    Refill:  1   phentermine (ADIPEX-P) 37.5 MG tablet    Sig: 1/2 to 1 tablet daily.    Dispense:  30 tablet    Refill:  0   1. Dyspepsia Kayla [name] is taking Dexilant 60 mg daily for dyspepsia.Plan:  Continue Dexilant.  Will refill today.  We reviewed the diagnosis of dyspepsia and importance of treatment. We discussed "red flag" symptoms and the importance of follow up if symptoms persisted despite treatment. We reviewed non-pharmacologic management of dyspepsia symptoms: including: caffeine reduction, dietary changes, elevate HOB, NPO after supper, reduction of alcohol intake, tobacco cessation, and weight loss.  - Refill dexlansoprazole (DEXILANT) 60 MG capsule; Take 1 capsule (60 mg total) by mouth daily.  Dispense: 30 capsule; Refill: 1  2. Polyphagia Controlled. Current treatment: phentermine 18.75 mg  daily. Polyphagia refers to excessive feelings of hunger. She will continue to focus on protein-rich, low simple carbohydrate foods. We reviewed the importance of hydration, regular exercise for stress reduction, and restorative sleep.  I have consulted the [place] Controlled Substances Registry for this patient, and feel the risk/benefit ratio today is favorable for proceeding with this prescription for a controlled substance. The patient understands monitoring parameters and red flags.   Having again reminded the patient of the "off label" use of Phentermine beyond three consecutive months, and again discussing the risks, benefits, contraindications, and limitations of it's use; given it's role in the successful treatment of obesity thus far and lack of adverse effect, patient has expressed desire and given informed verbal consent to continue use.   - Refill phentermine (ADIPEX-P) 37.5 MG tablet; 1/2 to 1 tablet daily.  Dispense: 30 tablet; Refill: 0  3. Obesity, current BMI 27.3  Course: Kayla [name] is currently in the action stage of change. As such, her goal is to continue with weight loss efforts.   Nutrition goals: She has agreed to keeping a food journal and adhering to recommended goals of 1000 calories and 85 grams of protein.   Exercise goals:  As is.  Behavioral modification strategies: increasing lean protein intake, decreasing simple carbohydrates, increasing vegetables, and increasing water intake.  Kayla [name] has agreed to follow-up with our clinic in 4 weeks. She was informed of the importance of frequent follow-up visits to maximize her success with intensive lifestyle modifications for her  multiple health conditions.   Objective:   Blood pressure 102/70, pulse 77, temperature 98 F (36.7 C), temperature source Oral, height 4\' 11"  (1.499 m), weight 135 lb (61.2 kg), SpO2 98 %. Body mass index is 27.27 kg/m.  General: Cooperative, alert, well developed, in no acute distress. HEENT:  Conjunctivae and lids unremarkable. Cardiovascular: Regular rhythm.  Lungs: Normal work of breathing. Neurologic: No focal deficits.   Lab Results  Component Value Date   CREATININE 0.9 11/25/2020   BUN 24 (A) 11/25/2020   NA 143 11/25/2020   K 4.0 11/25/2020   CL 101 11/25/2020   CO2 31 (A) 11/25/2020   Lab Results  Component Value Date   ALT 16 11/25/2020   AST 14 11/25/2020   ALKPHOS 75 11/25/2020   Lab Results  Component Value Date   TSH 2.06 11/25/2020   Lab Results  Component Value Date   CHOL 255 (A) 11/25/2020   HDL 58 11/25/2020   LDLCALC 148 11/25/2020   TRIG 270 (A) 11/25/2020   Lab Results  Component Value Date   WBC 10.1 11/25/2020   HGB 13.4 11/25/2020   HCT 40.0 04/17/2008   PLT 304 11/25/2020   Obesity Behavioral Intervention:   Approximately 15 minutes were spent on the discussion below.  ASK: We discussed the diagnosis of obesity with Kayla [name] today and Kayla [name] agreed to give [place] permission to discuss obesity behavioral modification therapy today.  ASSESS: Kayla [name] has the diagnosis of obesity and her BMI today is 27.3. Kayla [name] is in the action stage of change.   ADVISE: Kayla [name] was educated on the multiple health risks of obesity as well as the benefit of weight loss to improve her health. She was advised of the need for long term treatment and the importance of lifestyle modifications to improve her current health and to decrease her risk of future health problems.  AGREE: Multiple dietary modification options and treatment options were discussed and Kayla [name] agreed to follow the recommendations documented in the above note.  ARRANGE: Kayla [name] was educated on the importance of frequent visits to treat obesity as outlined per CMS and USPSTF guidelines and agreed to schedule her next follow up appointment today.  Attestation Statements:   Reviewed by clinician on day of visit: allergies, medications, problem list, medical history, surgical history, family  history, social history, and previous encounter notes.  I, Water quality scientist, CMA, am acting as transcriptionist for [name], DO  I have reviewed the above documentation for accuracy and completeness, and I agree with the above. [name], DO

## 2021-04-08 ENCOUNTER — Encounter (INDEPENDENT_AMBULATORY_CARE_PROVIDER_SITE_OTHER): Payer: Self-pay | Admitting: Family Medicine

## 2021-04-13 NOTE — Telephone Encounter (Signed)
Pt last seen by Dr. Wallace.  

## 2021-04-22 ENCOUNTER — Ambulatory Visit (INDEPENDENT_AMBULATORY_CARE_PROVIDER_SITE_OTHER): Payer: Medicare Other | Admitting: Family Medicine

## 2021-04-22 ENCOUNTER — Other Ambulatory Visit: Payer: Self-pay

## 2021-04-22 ENCOUNTER — Encounter (INDEPENDENT_AMBULATORY_CARE_PROVIDER_SITE_OTHER): Payer: Self-pay | Admitting: Family Medicine

## 2021-04-22 VITALS — BP 111/73 | HR 104 | Temp 98.7°F | Ht 59.0 in | Wt 132.0 lb

## 2021-04-22 DIAGNOSIS — Z6830 Body mass index (BMI) 30.0-30.9, adult: Secondary | ICD-10-CM | POA: Diagnosis not present

## 2021-04-22 DIAGNOSIS — D352 Benign neoplasm of pituitary gland: Secondary | ICD-10-CM | POA: Diagnosis not present

## 2021-04-22 DIAGNOSIS — K219 Gastro-esophageal reflux disease without esophagitis: Secondary | ICD-10-CM

## 2021-04-22 DIAGNOSIS — E669 Obesity, unspecified: Secondary | ICD-10-CM

## 2021-04-22 DIAGNOSIS — R632 Polyphagia: Secondary | ICD-10-CM | POA: Diagnosis not present

## 2021-04-22 DIAGNOSIS — R1013 Epigastric pain: Secondary | ICD-10-CM

## 2021-04-30 DIAGNOSIS — T84023A Instability of internal left knee prosthesis, initial encounter: Secondary | ICD-10-CM | POA: Diagnosis not present

## 2021-05-02 NOTE — Progress Notes (Signed)
Chief Complaint:   OBESITY Kayla [name] is here to discuss her progress with her obesity treatment plan along with follow-up of her obesity related diagnoses.   Today's visit was #: 6 Starting weight: 148 lbs Starting date: 01/08/2021 Today's weight: 132 lbs Today's date: 04/22/2021 Weight change since last visit: 3 lbs Total lbs lost to date: 16 lbs Body mass index is 26.66 kg/m.  Total weight loss percentage to date: -10.81%  Interim History:  Kayla [name] says she is happy with her progress.  She is ready to call GI for worsening reflux.  She has decided not to take cabergoline.  Current Meal Plan: keeping a food journal and adhering to recommended goals of 1000 calories and 85 grams of protein for 60% of the time.  Current Exercise Plan: Movement therapy. Current Anti-Obesity Medications: phentermine 37.5 mg daily. Side effects: None.  Assessment/Plan:   1. Gastroesophageal reflux disease She states that her GI is worsening.  She is not taking Dexilant. She restarted her previous PPI.   Plan:  [name] will call GI for worsening reflux.  We reviewed the diagnosis of GERD and importance of treatment. We discussed "red flag" symptoms and the importance of follow up if symptoms persisted despite treatment. We reviewed non-pharmacologic management of GERD symptoms: including: caffeine reduction, dietary changes, elevate HOB, NPO after supper, reduction of alcohol intake, tobacco cessation, and weight loss.  2. Polyphagia Controlled. Current treatment: phentermine 37.5 mg daily. Polyphagia refers to excessive feelings of hunger. She will continue to focus on protein-rich, low simple carbohydrate foods. We reviewed the importance of hydration, regular exercise for stress reduction, and restorative sleep.  Plan:  Continue phentermine.  Having again reminded the patient of the "off label" use of Phentermine beyond three consecutive months, and again discussing the risks, benefits,  contraindications, and limitations of it's use; given it's role in the successful treatment of obesity thus far and lack of adverse effect, patient has expressed desire and given informed verbal consent to continue use.   3. Dyspepsia [name] will call to make an appointment with GI.  4. Pituitary adenoma, with hyperprolactinemia Kayla [name] had an MRI that showed an increase in size of the pituitary adenoma. We will continue to monitor symptoms as they relate to her weight loss journey.  5. Obesity, current BMI 26.7  Course: Kayla [name] is currently in the action stage of change. As such, her goal is to continue with weight loss efforts.   Nutrition goals: She has agreed to keeping a food journal and adhering to recommended goals of 1000 calories and 85 grams of protein.   Exercise goals:  As is.  Behavioral modification strategies: increasing lean protein intake, decreasing simple carbohydrates, increasing vegetables, and increasing water intake.  Kayla [name] has agreed to follow-up with our clinic in 3 weeks. She was informed of the importance of frequent follow-up visits to maximize her success with intensive lifestyle modifications for her multiple health conditions.   Objective:   Blood pressure 111/73, pulse (!) 104, temperature 98.7 F (37.1 C), temperature source Oral, height '4\' 11"'$  (1.499 m), weight 132 lb (59.9 kg), SpO2 95 %. Body mass index is 26.66 kg/m.  General: Cooperative, alert, well developed, in no acute distress. HEENT: Conjunctivae and lids unremarkable. Cardiovascular: Regular rhythm.  Lungs: Normal work of breathing. Neurologic: No focal deficits.   Lab Results  Component Value Date   CREATININE 0.9 11/25/2020   BUN 24 (A) 11/25/2020   NA 143 11/25/2020   K 4.0 11/25/2020  CL 101 11/25/2020   CO2 31 (A) 11/25/2020   Lab Results  Component Value Date   ALT 16 11/25/2020   AST 14 11/25/2020   ALKPHOS 75 11/25/2020   Lab Results  Component Value Date   TSH 2.06  11/25/2020   Lab Results  Component Value Date   CHOL 255 (A) 11/25/2020   HDL 58 11/25/2020   LDLCALC 148 11/25/2020   TRIG 270 (A) 11/25/2020   Lab Results  Component Value Date   WBC 10.1 11/25/2020   HGB 13.4 11/25/2020   HCT 40.0 04/17/2008   PLT 304 11/25/2020   Obesity Behavioral Intervention:   Approximately 15 minutes were spent on the discussion below.  ASK: We discussed the diagnosis of obesity with Kayla [name] today and Kayla [name] agreed to give [place] permission to discuss obesity behavioral modification therapy today.  ASSESS: [name] has the diagnosis of obesity and her BMI today is 26.7. Kayla [name] is in the action stage of change.   ADVISE: Kayla [name] was educated on the multiple health risks of obesity as well as the benefit of weight loss to improve her health. She was advised of the need for long term treatment and the importance of lifestyle modifications to improve her current health and to decrease her risk of future health problems.  AGREE: Multiple dietary modification options and treatment options were discussed and Kayla [name] agreed to follow the recommendations documented in the above note.  ARRANGE: Kayla [name] was educated on the importance of frequent visits to treat obesity as outlined per CMS and USPSTF guidelines and agreed to schedule her next follow up appointment today.  Attestation Statements:   Reviewed by clinician on day of visit: allergies, medications, problem list, medical history, surgical history, family history, social history, and previous encounter notes.  I, Water quality scientist, CMA, am acting as transcriptionist for Kayla Deutscher, DO  I have reviewed the above documentation for accuracy and completeness, and I agree with the above. Kayla Deutscher, DO

## 2021-05-06 DIAGNOSIS — R1310 Dysphagia, unspecified: Secondary | ICD-10-CM | POA: Diagnosis not present

## 2021-05-06 DIAGNOSIS — R0989 Other specified symptoms and signs involving the circulatory and respiratory systems: Secondary | ICD-10-CM | POA: Diagnosis not present

## 2021-05-06 DIAGNOSIS — Z8719 Personal history of other diseases of the digestive system: Secondary | ICD-10-CM | POA: Diagnosis not present

## 2021-05-13 ENCOUNTER — Other Ambulatory Visit: Payer: Self-pay

## 2021-05-13 ENCOUNTER — Encounter (INDEPENDENT_AMBULATORY_CARE_PROVIDER_SITE_OTHER): Payer: Self-pay | Admitting: Family Medicine

## 2021-05-13 ENCOUNTER — Telehealth (INDEPENDENT_AMBULATORY_CARE_PROVIDER_SITE_OTHER): Payer: Medicare Other | Admitting: Family Medicine

## 2021-05-13 ENCOUNTER — Ambulatory Visit
Admission: RE | Admit: 2021-05-13 | Discharge: 2021-05-13 | Disposition: A | Discharge status: Home / Self Care | Payer: Medicare Other | Source: Ambulatory Visit | Attending: Internal Medicine | Admitting: Internal Medicine

## 2021-05-13 VITALS — Ht 59.0 in

## 2021-05-13 DIAGNOSIS — R632 Polyphagia: Secondary | ICD-10-CM

## 2021-05-13 DIAGNOSIS — Z6830 Body mass index (BMI) 30.0-30.9, adult: Secondary | ICD-10-CM

## 2021-05-13 DIAGNOSIS — E669 Obesity, unspecified: Secondary | ICD-10-CM

## 2021-05-13 DIAGNOSIS — K219 Gastro-esophageal reflux disease without esophagitis: Secondary | ICD-10-CM | POA: Diagnosis not present

## 2021-05-13 DIAGNOSIS — M810 Age-related osteoporosis without current pathological fracture: Secondary | ICD-10-CM

## 2021-05-13 DIAGNOSIS — Z780 Asymptomatic menopausal state: Secondary | ICD-10-CM | POA: Diagnosis not present

## 2021-05-13 DIAGNOSIS — M8589 Other specified disorders of bone density and structure, multiple sites: Secondary | ICD-10-CM | POA: Diagnosis not present

## 2021-05-19 NOTE — Progress Notes (Signed)
TeleHealth Visit:  Due to the COVID-19 pandemic, this visit was completed with telemedicine (audio/video) technology to reduce patient and provider exposure as well as to preserve personal protective equipment.   Kayla [name] has verbally consented to this TeleHealth visit. The patient is located at home, the provider is located at the [facility] office. The participants in this visit include the listed provider and patient. The visit was conducted today via video.  Chief Complaint: OBESITY Kayla [name] is here to discuss her progress with her obesity treatment plan along with follow-up of her obesity related diagnoses. Kayla [name] is on keeping a food journal and adhering to recommended goals of 1000 calories and 85 grams of protein and states she is following her eating plan approximately 70% of the time. Kayla [name] states she is not exercising at this time.  Today's visit was #: 7 Starting weight: 148 lbs Starting date: 01/08/2021  Interim History: Kayla [name] wants to restart MFP. She has a EGD scheduled on 05/27/2021 due to worsening reflux. She reports Carafate tablet got stuck when she tried to take it.   Subjective:   1. Gastroesophageal reflux disease, unspecified whether esophagitis present Kayla [name] has EGD scheduled for 05/27/2021. She has already requested liquid Carafate from GI. We will continue to monitor symptoms as they relate to her weight loss journey.  2. Polyphagia Controlled. Current treatment: Phentermine 37.5 mg daily.   Plan: She will continue to focus on protein-rich, low simple carbohydrate foods. We reviewed the importance of hydration, regular exercise for stress reduction, and restorative sleep.  3. Obesity, current BMI 26.7 Course: Kayla [name] is currently in the action stage of change. As such, her goal is to continue with weight loss efforts.   Nutritional goals: She has agreed to keeping a food journal and adhering to recommended goals of 1000 calories and 85 grams of  protein.   Exercise goals: As is.  Behavioral modification strategies: increasing lean protein intake and decreasing simple carbohydrates.  Kayla [name] has agreed to follow-up with our clinic in 4 weeks. She was informed of the importance of frequent follow-up visits to maximize her success with intensive lifestyle modifications for her multiple health conditions.  Objective:   VITALS: Per patient if applicable, see vitals. GENERAL: Alert and in no acute distress. CARDIOPULMONARY: No increased WOB. Speaking in clear sentences.  PSYCH: Pleasant and cooperative. Speech normal rate and rhythm. Affect is appropriate. Insight and judgement are appropriate. Attention is focused, linear, and appropriate.  NEURO: Oriented as arrived to appointment on time with no prompting.   Lab Results  Component Value Date   CREATININE 0.9 11/25/2020   BUN 24 (A) 11/25/2020   NA 143 11/25/2020   K 4.0 11/25/2020   CL 101 11/25/2020   CO2 31 (A) 11/25/2020   Lab Results  Component Value Date   ALT 16 11/25/2020   AST 14 11/25/2020   ALKPHOS 75 11/25/2020   Lab Results  Component Value Date   TSH 2.06 11/25/2020   Lab Results  Component Value Date   CHOL 255 (A) 11/25/2020   HDL 58 11/25/2020   LDLCALC 148 11/25/2020   TRIG 270 (A) 11/25/2020   Lab Results  Component Value Date   WBC 10.1 11/25/2020   HGB 13.4 11/25/2020   HCT 40.0 04/17/2008   PLT 304 11/25/2020   Attestation Statements:   Reviewed by clinician on day of visit: allergies, medications, problem list, medical history, surgical history, family history, social history, and previous encounter notes.  Leodis Binet  Friedenbach, CMA, am acting as Location manager for [employer], DO.  I have reviewed the above documentation for accuracy and completeness, and I agree with the above. [name], DO

## 2021-05-27 DIAGNOSIS — K2930 Chronic superficial gastritis without bleeding: Secondary | ICD-10-CM | POA: Diagnosis not present

## 2021-05-27 DIAGNOSIS — K3189 Other diseases of stomach and duodenum: Secondary | ICD-10-CM | POA: Diagnosis not present

## 2021-05-27 DIAGNOSIS — R1310 Dysphagia, unspecified: Secondary | ICD-10-CM | POA: Diagnosis not present

## 2021-05-27 DIAGNOSIS — K31A11 Gastric intestinal metaplasia without dysplasia, involving the antrum: Secondary | ICD-10-CM | POA: Diagnosis not present

## 2021-05-27 DIAGNOSIS — K222 Esophageal obstruction: Secondary | ICD-10-CM | POA: Diagnosis not present

## 2021-05-27 DIAGNOSIS — K317 Polyp of stomach and duodenum: Secondary | ICD-10-CM | POA: Diagnosis not present

## 2021-05-27 DIAGNOSIS — K2940 Chronic atrophic gastritis without bleeding: Secondary | ICD-10-CM | POA: Diagnosis not present

## 2021-05-27 DIAGNOSIS — K31A19 Gastric intestinal metaplasia without dysplasia, unspecified site: Secondary | ICD-10-CM | POA: Diagnosis not present

## 2021-05-27 DIAGNOSIS — K449 Diaphragmatic hernia without obstruction or gangrene: Secondary | ICD-10-CM | POA: Diagnosis not present

## 2021-06-01 DIAGNOSIS — K317 Polyp of stomach and duodenum: Secondary | ICD-10-CM | POA: Diagnosis not present

## 2021-06-01 DIAGNOSIS — K31A19 Gastric intestinal metaplasia without dysplasia, unspecified site: Secondary | ICD-10-CM | POA: Diagnosis not present

## 2021-06-01 DIAGNOSIS — K2940 Chronic atrophic gastritis without bleeding: Secondary | ICD-10-CM | POA: Diagnosis not present

## 2021-06-10 ENCOUNTER — Encounter (INDEPENDENT_AMBULATORY_CARE_PROVIDER_SITE_OTHER): Payer: Self-pay | Admitting: Family Medicine

## 2021-06-10 ENCOUNTER — Other Ambulatory Visit: Payer: Self-pay

## 2021-06-10 ENCOUNTER — Ambulatory Visit (INDEPENDENT_AMBULATORY_CARE_PROVIDER_SITE_OTHER): Payer: Medicare Other | Admitting: Family Medicine

## 2021-06-10 VITALS — BP 120/76 | HR 87 | Temp 98.4°F | Ht 59.0 in | Wt 127.0 lb

## 2021-06-10 DIAGNOSIS — Z6830 Body mass index (BMI) 30.0-30.9, adult: Secondary | ICD-10-CM

## 2021-06-10 DIAGNOSIS — E669 Obesity, unspecified: Secondary | ICD-10-CM | POA: Diagnosis not present

## 2021-06-10 DIAGNOSIS — M816 Localized osteoporosis [Lequesne]: Secondary | ICD-10-CM | POA: Diagnosis not present

## 2021-06-10 DIAGNOSIS — R1013 Epigastric pain: Secondary | ICD-10-CM | POA: Diagnosis not present

## 2021-06-10 DIAGNOSIS — R632 Polyphagia: Secondary | ICD-10-CM | POA: Diagnosis not present

## 2021-06-10 DIAGNOSIS — Z9189 Other specified personal risk factors, not elsewhere classified: Secondary | ICD-10-CM

## 2021-06-10 MED ORDER — PHENTERMINE HCL 37.5 MG PO TABS: ORAL_TABLET | ORAL | 0 refills | Status: DC

## 2021-06-11 NOTE — Progress Notes (Signed)
Chief Complaint:   OBESITY Kayla [name] is here to discuss her progress with her obesity treatment plan along with follow-up of her obesity related diagnoses.   Today's visit was #: 8 Starting weight: 148 lbs Starting date: 01/08/2021 Today's weight: 127 lbs Today's date: 06/10/2021 Weight change since last visit: 5 lbs Total lbs lost to date: 21 lbs Body mass index is 25.65 kg/m.  Total weight loss percentage to date: -14.19%  Current Meal Plan: keeping a food journal and adhering to recommended goals of 1100 calories and 85 grams of protein for 40% of the time.  Current Exercise Plan: Movement Therapy. Current Anti-Obesity Medications: phentermine 37.5 mg 1/2 to 1 tablet daily. Side effects: None.  Interim History:  Kayla [name] recently had an EGD and is waiting for the biopsy report.  She has the diagnosis of osteoporosis and will see Rheumatology for treatment.  She says she is happy with the ability to make mindful food choices.  Assessment/Plan:   1. Polyphagia Current treatment: phentermine 37.5 mg 1/2 to 1 tablet daily. She will continue to focus on protein-rich, low simple carbohydrate foods. We reviewed the importance of hydration, regular exercise for stress reduction, and restorative sleep.  - Refill phentermine (ADIPEX-P) 37.5 MG tablet; 1/2 to 1 tablet daily.  Dispense: 30 tablet; Refill: 0  Having again reminded the patient of the "off label" use of Phentermine beyond three consecutive months, and again discussing the risks, benefits, contraindications, and limitations of it's use; given it's role in the successful treatment of obesity thus far and lack of adverse effect, patient has expressed desire and given informed verbal consent to continue use.   I have consulted the [place] Controlled Substances Registry for this patient, and feel the risk/benefit ratio today is favorable for proceeding with this prescription for a controlled substance. The patient understands monitoring  parameters and red flags.   2. Dyspepsia Kayla [name] had a recent EGD and is being followed by GI.  She is currently waiting for biopsy results.  We reviewed the diagnosis of dyspepsia and importance of treatment. We discussed "red flag" symptoms and the importance of follow up if symptoms persisted despite treatment. We reviewed non-pharmacologic management of dyspepsia symptoms: including: caffeine reduction, dietary changes, elevate HOB, NPO after supper, reduction of alcohol intake, tobacco cessation, and weight loss.  3. Localized osteoporosis without current pathological fracture Kayla [name] will see Rheumatology for osteoporosis treatment.  4. At risk for malnutrition Kayla [name] was given extensive malnutrition prevention education and counseling today of more than 8 minutes.  Counseled her that malnutrition refers to inappropriate nutrients or not the right balance of nutrients for optimal health.    5. Obesity, current BMI 25.7  Course: Kayla [name] is currently in the action stage of change. As such, her goal is to continue with weight loss efforts.   Nutrition goals: She has agreed to keeping a food journal and adhering to recommended goals of 1000 calories and 85 grams of protein.   Exercise goals:  As is.  Behavioral modification strategies: increasing lean protein intake, decreasing simple carbohydrates, increasing vegetables, and increasing water intake.  Kayla [name] has agreed to follow-up with our clinic in 4-6 weeks. She was informed of the importance of frequent follow-up visits to maximize her success with intensive lifestyle modifications for her multiple health conditions.   Objective:   Blood pressure 120/76, pulse 87, temperature 98.4 F (36.9 C), temperature source Oral, height '4\' 11"'$  (1.499 m), weight 127 lb (57.6 kg), SpO2 96 %. Body mass  index is 25.65 kg/m.  General: Cooperative, alert, well developed, in no acute distress. HEENT: Conjunctivae and lids unremarkable. Cardiovascular:  Regular rhythm.  Lungs: Normal work of breathing. Neurologic: No focal deficits.   Lab Results  Component Value Date   CREATININE 0.9 11/25/2020   BUN 24 (A) 11/25/2020   NA 143 11/25/2020   K 4.0 11/25/2020   CL 101 11/25/2020   CO2 31 (A) 11/25/2020   Lab Results  Component Value Date   ALT 16 11/25/2020   AST 14 11/25/2020   ALKPHOS 75 11/25/2020   Lab Results  Component Value Date   TSH 2.06 11/25/2020   Lab Results  Component Value Date   CHOL 255 (A) 11/25/2020   HDL 58 11/25/2020   LDLCALC 148 11/25/2020   TRIG 270 (A) 11/25/2020   Lab Results  Component Value Date   WBC 10.1 11/25/2020   HGB 13.4 11/25/2020   HCT 40.0 04/17/2008   PLT 304 11/25/2020   Attestation Statements:   Reviewed by clinician on day of visit: allergies, medications, problem list, medical history, surgical history, family history, social history, and previous encounter notes.  I, Water quality scientist, CMA, am acting as transcriptionist for [name], DO  I have reviewed the above documentation for accuracy and completeness, and I agree with the above. [name], DO

## 2021-06-18 ENCOUNTER — Other Ambulatory Visit (INDEPENDENT_AMBULATORY_CARE_PROVIDER_SITE_OTHER): Payer: Self-pay | Admitting: Family Medicine

## 2021-07-01 DIAGNOSIS — H4042X3 Glaucoma secondary to eye inflammation, left eye, severe stage: Secondary | ICD-10-CM | POA: Diagnosis not present

## 2021-07-01 DIAGNOSIS — H4041X1 Glaucoma secondary to eye inflammation, right eye, mild stage: Secondary | ICD-10-CM | POA: Diagnosis not present

## 2021-07-22 ENCOUNTER — Ambulatory Visit (INDEPENDENT_AMBULATORY_CARE_PROVIDER_SITE_OTHER): Payer: Medicare Other | Admitting: Family Medicine

## 2021-07-22 ENCOUNTER — Other Ambulatory Visit: Payer: Self-pay

## 2021-07-22 VITALS — BP 110/75 | HR 100 | Temp 98.0°F | Ht 59.0 in | Wt 126.0 lb

## 2021-07-22 DIAGNOSIS — M25562 Pain in left knee: Secondary | ICD-10-CM

## 2021-07-22 DIAGNOSIS — R632 Polyphagia: Secondary | ICD-10-CM

## 2021-07-22 DIAGNOSIS — R1310 Dysphagia, unspecified: Secondary | ICD-10-CM

## 2021-07-22 DIAGNOSIS — G8929 Other chronic pain: Secondary | ICD-10-CM | POA: Diagnosis not present

## 2021-07-22 DIAGNOSIS — E669 Obesity, unspecified: Secondary | ICD-10-CM

## 2021-07-22 DIAGNOSIS — Z6830 Body mass index (BMI) 30.0-30.9, adult: Secondary | ICD-10-CM

## 2021-07-22 MED ORDER — ESOMEPRAZOLE MAGNESIUM 40 MG PO CPDR
40.0000 mg | DELAYED_RELEASE_CAPSULE | Freq: Two times a day (BID) | ORAL | 0 refills | Status: DC
Start: 2021-07-22 — End: 2021-07-22

## 2021-07-22 MED ORDER — ESOMEPRAZOLE MAGNESIUM 40 MG PO CPDR: 40.0000 mg | DELAYED_RELEASE_CAPSULE | Freq: Two times a day (BID) | ORAL | 4 refills | Status: DC

## 2021-07-27 NOTE — Progress Notes (Signed)
Chief Complaint:   OBESITY Kayla [name] is here to discuss her progress with her obesity treatment plan along with follow-up of her obesity related diagnoses. See Medical Weight Management Flowsheet for complete bioelectrical impedance results.  Today's visit was #: 9 Starting weight: 148 lbs Starting date: 01/08/2021 Weight change since last visit: 1 lb Total lbs lost to date: 22 lbs Total weight loss percentage to date: -14.86%  Nutrition Plan: Keeping a food journal and adhering to recommended goals of 1000 calories and 85 grams of protein daily for 40-50% of the time. Activity: Movement specialist 2 times per week. Anti-obesity medications: phentermine 37.5 mg 1/2 - 1 tablet daily. Reported side effects: None.  Interim History: Kayla [name] says that her arthritis is worsening.  Her GI appointment has been rescheduled to November.  Assessment/Plan:   1. Odynophagia She says, it feels like there is a "lump in my throat".  Feels like trying to swallow.  Overeating makes it wore.  Lying down makes it worse/more aware.  She has increased salivation and lying on her left side makes it worse.  Trial baclofen?   Plan:  Increase Nexium to 40 mg twice daily.   - Increase esomeprazole (NEXIUM) 40 MG capsule; Take 1 capsule (40 mg total) by mouth 2 (two) times daily before a meal.  Dispense: 60 capsule; Refill: 4  2. Chronic pain of left knee She will be having knee surgery in February.  3. Polyphagia Controlled. Current treatment: phentermine 37.5 mg 1/2 - 1 tablet daily. She will continue to focus on protein-rich, low simple carbohydrate foods. We reviewed the importance of hydration, regular exercise for stress reduction, and restorative sleep.  4. Obesity, current BMI 25.5  Course: Kayla [name] is currently in the action stage of change. As such, her goal is to continue with weight loss efforts.   Nutrition goals: She has agreed to keeping a food journal and adhering to recommended goals of 1000  calories and 85 grams of protein.   Exercise goals:  As is.  Behavioral modification strategies: increasing lean protein intake, decreasing simple carbohydrates, increasing vegetables, and increasing water intake.  Kayla [name] has agreed to follow-up with our clinic in 4 weeks. She was informed of the importance of frequent follow-up visits to maximize her success with intensive lifestyle modifications for her multiple health conditions.   Objective:   Blood pressure 110/75, pulse 100, temperature 98 F (36.7 C), height 4\' 11"  (1.499 m), weight 126 lb (57.2 kg), SpO2 98 %. Body mass index is 25.45 kg/m.  General: Cooperative, alert, well developed, in no acute distress. HEENT: Conjunctivae and lids unremarkable. Cardiovascular: Regular rhythm.  Lungs: Normal work of breathing. Neurologic: No focal deficits.   Lab Results  Component Value Date   CREATININE 0.9 11/25/2020   BUN 24 (A) 11/25/2020   NA 143 11/25/2020   K 4.0 11/25/2020   CL 101 11/25/2020   CO2 31 (A) 11/25/2020   Lab Results  Component Value Date   ALT 16 11/25/2020   AST 14 11/25/2020   ALKPHOS 75 11/25/2020   Lab Results  Component Value Date   TSH 2.06 11/25/2020   Lab Results  Component Value Date   CHOL 255 (A) 11/25/2020   HDL 58 11/25/2020   LDLCALC 148 11/25/2020   TRIG 270 (A) 11/25/2020   Lab Results  Component Value Date   WBC 10.1 11/25/2020   HGB 13.4 11/25/2020   HCT 40.0 04/17/2008   PLT 304 11/25/2020   Attestation Statements:  Reviewed by clinician on day of visit: allergies, medications, problem list, medical history, surgical history, family history, social history, and previous encounter notes.  I, Water quality scientist, CMA, am acting as transcriptionist for [name], DO  I have reviewed the above documentation for accuracy and completeness, and I agree with the above. -  [name], DO, MS, FAAFP, DABOM - Family and Bariatric Medicine.

## 2021-08-18 DIAGNOSIS — K224 Dyskinesia of esophagus: Secondary | ICD-10-CM | POA: Diagnosis not present

## 2021-08-19 ENCOUNTER — Encounter (INDEPENDENT_AMBULATORY_CARE_PROVIDER_SITE_OTHER): Payer: Self-pay | Admitting: Family Medicine

## 2021-08-19 ENCOUNTER — Other Ambulatory Visit: Payer: Self-pay

## 2021-08-19 ENCOUNTER — Ambulatory Visit (INDEPENDENT_AMBULATORY_CARE_PROVIDER_SITE_OTHER): Payer: Medicare Other | Admitting: Family Medicine

## 2021-08-19 VITALS — BP 101/66 | HR 94 | Temp 98.1°F | Ht 59.0 in | Wt 125.0 lb

## 2021-08-19 DIAGNOSIS — K224 Dyskinesia of esophagus: Secondary | ICD-10-CM | POA: Diagnosis not present

## 2021-08-19 DIAGNOSIS — Z6830 Body mass index (BMI) 30.0-30.9, adult: Secondary | ICD-10-CM

## 2021-08-19 DIAGNOSIS — E669 Obesity, unspecified: Secondary | ICD-10-CM

## 2021-08-19 DIAGNOSIS — M0800 Unspecified juvenile rheumatoid arthritis of unspecified site: Secondary | ICD-10-CM

## 2021-08-19 DIAGNOSIS — R632 Polyphagia: Secondary | ICD-10-CM | POA: Diagnosis not present

## 2021-08-19 MED ORDER — PHENTERMINE HCL 37.5 MG PO TABS: ORAL_TABLET | ORAL | 0 refills | Status: DC

## 2021-08-20 NOTE — Progress Notes (Signed)
Chief Complaint:   OBESITY Kayla [name] is here to discuss her progress with her obesity treatment plan along with follow-up of her obesity related diagnoses. See Medical Weight Management Flowsheet for complete bioelectrical impedance results.  Today's visit was #: 10 Starting weight: 148 lbs Starting date: 01/08/2021 Weight change since last visit: 1 lb Total lbs lost to date: 23 lbs Total weight loss percentage to date: -15.54%  Nutrition Plan: Keeping a food journal and adhering to recommended goals of 1000 calories and 85 grams of protein daily for 60-70% of the time. Activity: Movement therapy for 30 minutes 2 times per week.  Anti-obesity medications: phentermine 37.5 mg daily. Reported side effects: None.  Interim History: Kayla [name] says she feels ready for knee surgery.  She saw GI and started Baclofen for esophageal spasm. She is happy that it seems to be helping. She is still working on dosing to avoid sedation.  Assessment/Plan:   1. Polyphagia Controlled. Current treatment: phentermine 37.5 mg daily.    Plan:  Continue phentermine.  Will refill today.  She will continue to focus on protein-rich, low simple carbohydrate foods. We reviewed the importance of hydration, regular exercise for stress reduction, and restorative sleep.  - Refill phentermine (ADIPEX-P) 37.5 MG tablet; 1/2 to 1 tablet daily.  Dispense: 30 tablet; Refill: 0  Having again reminded the patient of the "off label" use of Phentermine beyond three consecutive months, and again discussing the risks, benefits, contraindications, and limitations of it's use; given it's role in the successful treatment of obesity thus far and lack of adverse effect, patient has expressed desire and given informed verbal consent to continue use.   I have consulted the [place] Controlled Substances Registry for this patient, and feel the risk/benefit ratio today is favorable for proceeding with this prescription for a controlled substance.  The patient understands monitoring parameters and red flags.   2. JRA (juvenile rheumatoid arthritis) (Kayla [name]) With chronic knee pain.  She will be having knee surgery in February. We will continue to monitor symptoms as they relate to her weight loss journey.  3. Esophageal spasm Followed by GI.  She is taking Baclofen 5 mg up to twice daily if needed/tolerated. We will continue to monitor symptoms as they relate to her weight loss journey.  4. Obesity BMI today is 25  Course: Kayla [name] is currently in the action stage of change. As such, her goal is to continue with weight loss efforts.   Nutrition goals: She has agreed to keeping a food journal and adhering to recommended goals of 1000 calories and 85 grams of protein.   Exercise goals:  As is.  Behavioral modification strategies: increasing lean protein intake, decreasing simple carbohydrates, increasing vegetables, and increasing water intake.  Kayla [name] has agreed to follow-up with our clinic in 4 weeks. She was informed of the importance of frequent follow-up visits to maximize her success with intensive lifestyle modifications for her multiple health conditions.   Objective:   Blood pressure 101/66, pulse 94, temperature 98.1 F (36.7 C), temperature source Oral, height 4\' 11"  (1.499 m), weight 125 lb (56.7 kg), SpO2 97 %. Body mass index is 25.25 kg/m.  General: Cooperative, alert, well developed, in no acute distress. HEENT: Conjunctivae and lids unremarkable. Cardiovascular: Regular rhythm.  Lungs: Normal work of breathing. Neurologic: No focal deficits.   Lab Results  Component Value Date   CREATININE 0.9 11/25/2020   BUN 24 (A) 11/25/2020   NA 143 11/25/2020   K 4.0 11/25/2020  CL 101 11/25/2020   CO2 31 (A) 11/25/2020   Lab Results  Component Value Date   ALT 16 11/25/2020   AST 14 11/25/2020   ALKPHOS 75 11/25/2020   Lab Results  Component Value Date   TSH 2.06 11/25/2020   Lab Results  Component Value Date    CHOL 255 (A) 11/25/2020   HDL 58 11/25/2020   LDLCALC 148 11/25/2020   TRIG 270 (A) 11/25/2020   Lab Results  Component Value Date   WBC 10.1 11/25/2020   HGB 13.4 11/25/2020   HCT 40.0 04/17/2008   PLT 304 11/25/2020   Attestation Statements:   Reviewed by clinician on day of visit: allergies, medications, problem list, medical history, surgical history, family history, social history, and previous encounter notes.  I, Water quality scientist, CMA, am acting as transcriptionist for [name], DO  I have reviewed the above documentation for accuracy and completeness, and I agree with the above. -  [name], DO, MS, FAAFP, DABOM - Family and Bariatric Medicine.

## 2021-08-21 ENCOUNTER — Encounter (INDEPENDENT_AMBULATORY_CARE_PROVIDER_SITE_OTHER): Payer: Self-pay | Admitting: Family Medicine

## 2021-09-23 ENCOUNTER — Other Ambulatory Visit: Payer: Self-pay

## 2021-09-23 ENCOUNTER — Ambulatory Visit (INDEPENDENT_AMBULATORY_CARE_PROVIDER_SITE_OTHER): Payer: Medicare Other | Admitting: Family Medicine

## 2021-09-23 ENCOUNTER — Encounter (INDEPENDENT_AMBULATORY_CARE_PROVIDER_SITE_OTHER): Payer: Self-pay | Admitting: Family Medicine

## 2021-09-23 VITALS — BP 122/82 | HR 88 | Temp 97.4°F | Ht 59.0 in | Wt 125.0 lb

## 2021-09-23 DIAGNOSIS — Z6825 Body mass index (BMI) 25.0-25.9, adult: Secondary | ICD-10-CM | POA: Diagnosis not present

## 2021-09-23 DIAGNOSIS — E669 Obesity, unspecified: Secondary | ICD-10-CM | POA: Diagnosis not present

## 2021-09-23 DIAGNOSIS — R632 Polyphagia: Secondary | ICD-10-CM | POA: Diagnosis not present

## 2021-09-23 DIAGNOSIS — Z6830 Body mass index (BMI) 30.0-30.9, adult: Secondary | ICD-10-CM

## 2021-09-24 NOTE — Progress Notes (Signed)
Chief Complaint:   OBESITY Kayla [name] is here to discuss her progress with her obesity treatment plan along with follow-up of her obesity related diagnoses. [name] is on keeping a food journal and adhering to recommended goals of 1000 calories and 85 grams of protein daily and states she is following her eating plan approximately 50% of the time. [name] states she is not currently exercising.  Today's visit was #: 11 Starting weight: 148 lbs Starting date: 01/08/2021 Today's weight: 125 lbs Today's date: 09/23/2021 Total lbs lost to date: 23 Total lbs lost since last in-office visit: 0  Interim History: This is Holliday's first office visit with me, as she typically sees Dr. Juleen China but she was placed on my schedule due to late arrival.  Subjective:   1. Polyphagia Kayla [name] takes 1/2 tablet of phentermine daily, and she is tolerating it well with no side effects. She denies hunger or cravings for sugar. She has no issues or concerns.  Assessment/Plan:  No orders of the defined types were placed in this encounter.   There are no discontinued medications.   No orders of the defined types were placed in this encounter.    1. Polyphagia Intensive lifestyle modifications are the first line treatment for this issue. We discussed several lifestyle modifications today. Kayla [name] denies a need for a refill of phentermine today. She will continue to work on diet, exercise and weight loss efforts. Orders and follow up as documented in patient record.  Counseling Polyphagia is excessive hunger. Causes can include: low blood sugars, hypERthyroidism, PMS, lack of sleep, stress, insulin resistance, diabetes, certain medications, and diets that are deficient in protein and fiber.   2. Obesity with current BMI of 25.4 Kayla [name] is currently in the action stage of change. As such, her goal is to continue with weight loss efforts. She has agreed to keeping a food journal and adhering to recommended goals of 1000  calories and 85 grams of protein daily.   Exercise goals: As is (physical therapy).  Behavioral modification strategies: holiday eating strategies  and planning for success.  Kayla [name] has agreed to follow-up with our clinic in 4 to 5 weeks with Dr. Juleen China. She was informed of the importance of frequent follow-up visits to maximize her success with intensive lifestyle modifications for her multiple health conditions.   Objective:   Blood pressure 122/82, pulse 88, temperature (!) 97.4 F (36.3 C), height 4\' 11"  (1.499 m), weight 125 lb (56.7 kg), SpO2 96 %. Body mass index is 25.25 kg/m.  General: Cooperative, alert, well developed, in no acute distress. HEENT: Conjunctivae and lids unremarkable. Cardiovascular: Regular rhythm.  Lungs: Normal work of breathing. Neurologic: No focal deficits.   Lab Results  Component Value Date   CREATININE 0.9 11/25/2020   BUN 24 (A) 11/25/2020   NA 143 11/25/2020   K 4.0 11/25/2020   CL 101 11/25/2020   CO2 31 (A) 11/25/2020   Lab Results  Component Value Date   ALT 16 11/25/2020   AST 14 11/25/2020   ALKPHOS 75 11/25/2020   No results found for: HGBA1C No results found for: INSULIN Lab Results  Component Value Date   TSH 2.06 11/25/2020   Lab Results  Component Value Date   CHOL 255 (A) 11/25/2020   HDL 58 11/25/2020   LDLCALC 148 11/25/2020   TRIG 270 (A) 11/25/2020   No results found for: VD25OH Lab Results  Component Value Date   WBC 10.1 11/25/2020   HGB 13.4 11/25/2020  HCT 40.0 04/17/2008   PLT 304 11/25/2020   No results found for: IRON, TIBC, FERRITIN  Attestation Statements:   Reviewed by clinician on day of visit: allergies, medications, problem list, medical history, surgical history, family history, social history, and previous encounter notes.  Time spent on visit including pre-visit chart review and post-visit care and charting was 20 minutes.    Wilhemena Durie, am acting as transcriptionist for  [employer], DO.  I have reviewed the above documentation for accuracy and completeness, and I agree with the above. Marjory Sneddon, D.O.  The [place] was signed into law in 2016 which includes the topic of electronic health records.  This provides immediate access to information in MyChart.  This includes consultation notes, operative notes, office notes, lab results and pathology reports.  If you have any questions about what you read please let us know at your next visit so we can discuss your concerns and take corrective action if need be.  We are right here with you.

## 2021-10-30 DIAGNOSIS — H4041X1 Glaucoma secondary to eye inflammation, right eye, mild stage: Secondary | ICD-10-CM | POA: Diagnosis not present

## 2021-10-30 DIAGNOSIS — H4042X3 Glaucoma secondary to eye inflammation, left eye, severe stage: Secondary | ICD-10-CM | POA: Diagnosis not present

## 2021-11-01 ENCOUNTER — Emergency Department (HOSPITAL_COMMUNITY): Payer: Medicare Other | Admitting: Certified Registered Nurse Anesthetist

## 2021-11-01 ENCOUNTER — Emergency Department (HOSPITAL_COMMUNITY): Payer: Medicare Other

## 2021-11-01 ENCOUNTER — Encounter (HOSPITAL_COMMUNITY)
Admission: EM | Disposition: A | Discharge status: Home / Self Care | Payer: Self-pay | Source: Home / Self Care | Attending: Emergency Medicine

## 2021-11-01 ENCOUNTER — Other Ambulatory Visit: Payer: Self-pay

## 2021-11-01 ENCOUNTER — Encounter (HOSPITAL_COMMUNITY): Payer: Self-pay

## 2021-11-01 ENCOUNTER — Ambulatory Visit (HOSPITAL_COMMUNITY)
Admission: EM | Admit: 2021-11-01 | Discharge: 2021-11-01 | Disposition: A | Discharge status: Home / Self Care | Payer: Medicare Other | Attending: Emergency Medicine | Admitting: Emergency Medicine

## 2021-11-01 DIAGNOSIS — F419 Anxiety disorder, unspecified: Secondary | ICD-10-CM | POA: Insufficient documentation

## 2021-11-01 DIAGNOSIS — E559 Vitamin D deficiency, unspecified: Secondary | ICD-10-CM | POA: Diagnosis not present

## 2021-11-01 DIAGNOSIS — Z96643 Presence of artificial hip joint, bilateral: Secondary | ICD-10-CM | POA: Insufficient documentation

## 2021-11-01 DIAGNOSIS — Z96653 Presence of artificial knee joint, bilateral: Secondary | ICD-10-CM | POA: Insufficient documentation

## 2021-11-01 DIAGNOSIS — Z20822 Contact with and (suspected) exposure to covid-19: Secondary | ICD-10-CM | POA: Diagnosis not present

## 2021-11-01 DIAGNOSIS — I10 Essential (primary) hypertension: Secondary | ICD-10-CM | POA: Diagnosis not present

## 2021-11-01 DIAGNOSIS — J449 Chronic obstructive pulmonary disease, unspecified: Secondary | ICD-10-CM | POA: Diagnosis not present

## 2021-11-01 DIAGNOSIS — T84020A Dislocation of internal right hip prosthesis, initial encounter: Secondary | ICD-10-CM | POA: Diagnosis not present

## 2021-11-01 DIAGNOSIS — M069 Rheumatoid arthritis, unspecified: Secondary | ICD-10-CM | POA: Insufficient documentation

## 2021-11-01 DIAGNOSIS — M810 Age-related osteoporosis without current pathological fracture: Secondary | ICD-10-CM | POA: Insufficient documentation

## 2021-11-01 DIAGNOSIS — Y792 Prosthetic and other implants, materials and accessory orthopedic devices associated with adverse incidents: Secondary | ICD-10-CM | POA: Insufficient documentation

## 2021-11-01 DIAGNOSIS — F32A Depression, unspecified: Secondary | ICD-10-CM | POA: Insufficient documentation

## 2021-11-01 DIAGNOSIS — S73004A Unspecified dislocation of right hip, initial encounter: Secondary | ICD-10-CM

## 2021-11-01 DIAGNOSIS — K219 Gastro-esophageal reflux disease without esophagitis: Secondary | ICD-10-CM | POA: Insufficient documentation

## 2021-11-01 DIAGNOSIS — R069 Unspecified abnormalities of breathing: Secondary | ICD-10-CM | POA: Diagnosis not present

## 2021-11-01 DIAGNOSIS — K2270 Barrett's esophagus without dysplasia: Secondary | ICD-10-CM | POA: Diagnosis not present

## 2021-11-01 HISTORY — PX: HIP CLOSED REDUCTION: SHX983

## 2021-11-01 LAB — CBC WITH DIFFERENTIAL/PLATELET
Abs Immature Granulocytes: 0.04 10*3/uL (ref 0.00–0.07)
Basophils Absolute: 0.1 10*3/uL (ref 0.0–0.1)
Basophils Relative: 1 %
Eosinophils Absolute: 0.4 10*3/uL (ref 0.0–0.5)
Eosinophils Relative: 3 %
HCT: 42.4 % (ref 36.0–46.0)
Hemoglobin: 13.9 g/dL (ref 12.0–15.0)
Immature Granulocytes: 0 %
Lymphocytes Relative: 31 %
Lymphs Abs: 4.1 10*3/uL — ABNORMAL HIGH (ref 0.7–4.0)
MCH: 31.7 pg (ref 26.0–34.0)
MCHC: 32.8 g/dL (ref 30.0–36.0)
MCV: 96.6 fL (ref 80.0–100.0)
Monocytes Absolute: 1.2 10*3/uL — ABNORMAL HIGH (ref 0.1–1.0)
Monocytes Relative: 9 %
Neutro Abs: 7.4 10*3/uL (ref 1.7–7.7)
Neutrophils Relative %: 56 %
Platelets: 333 10*3/uL (ref 150–400)
RBC: 4.39 MIL/uL (ref 3.87–5.11)
RDW: 12.2 % (ref 11.5–15.5)
WBC: 13.3 10*3/uL — ABNORMAL HIGH (ref 4.0–10.5)
nRBC: 0 % (ref 0.0–0.2)

## 2021-11-01 LAB — BASIC METABOLIC PANEL
Anion gap: 8 (ref 5–15)
BUN: 27 mg/dL — ABNORMAL HIGH (ref 6–20)
CO2: 29 mmol/L (ref 22–32)
Calcium: 9.7 mg/dL (ref 8.9–10.3)
Chloride: 102 mmol/L (ref 98–111)
Creatinine, Ser: 0.69 mg/dL (ref 0.44–1.00)
GFR, Estimated: >60 mL/min (ref >60)
Glucose, Bld: 124 mg/dL — ABNORMAL HIGH (ref 70–99)
Potassium: 3.9 mmol/L (ref 3.5–5.1)
Sodium: 139 mmol/L (ref 135–145)

## 2021-11-01 LAB — RESP PANEL BY RT-PCR (FLU A&B, COVID) ARPGX2
Influenza A by PCR: NEGATIVE
Influenza B by PCR: NEGATIVE
SARS Coronavirus 2 by RT PCR: NEGATIVE

## 2021-11-01 SURGERY — CLOSED REDUCTION, HIP
Anesthesia: General | Site: Hip | Laterality: Right

## 2021-11-01 MED ORDER — PROMETHAZINE HCL 25 MG/ML IJ SOLN: 6.2500 mg | INTRAMUSCULAR | Status: DC | PRN

## 2021-11-01 MED ORDER — MIDAZOLAM HCL 2 MG/2ML IJ SOLN
INTRAMUSCULAR | Status: AC
  Filled 2021-11-01: qty 2

## 2021-11-01 MED ORDER — MIDAZOLAM HCL 5 MG/5ML IJ SOLN
INTRAMUSCULAR | Status: DC | PRN
  Administered 2021-11-01 (×2): 1 mg via INTRAVENOUS

## 2021-11-01 MED ORDER — PROPOFOL 10 MG/ML IV BOLUS
INTRAVENOUS | Status: AC
  Filled 2021-11-01: qty 20

## 2021-11-01 MED ORDER — OXYCODONE HCL 5 MG/5ML PO SOLN: 5.0000 mg | Freq: Once | ORAL | Status: DC | PRN

## 2021-11-01 MED ORDER — DEXAMETHASONE SODIUM PHOSPHATE 10 MG/ML IJ SOLN
INTRAMUSCULAR | Status: DC | PRN
  Administered 2021-11-01: 10 mg via INTRAVENOUS

## 2021-11-01 MED ORDER — ACETAMINOPHEN 500 MG PO TABS
1000.0000 mg | ORAL_TABLET | Freq: Once | ORAL | Status: AC
  Administered 2021-11-01: 1000 mg via ORAL
  Filled 2021-11-01: qty 2

## 2021-11-01 MED ORDER — ONDANSETRON HCL 4 MG/2ML IJ SOLN
INTRAMUSCULAR | Status: DC | PRN
Start: 2021-11-01 — End: 2021-11-01
  Administered 2021-11-01: 4 mg via INTRAVENOUS

## 2021-11-01 MED ORDER — ONDANSETRON HCL 4 MG/2ML IJ SOLN
INTRAMUSCULAR | Status: AC
  Filled 2021-11-01: qty 2

## 2021-11-01 MED ORDER — MORPHINE SULFATE (PF) 4 MG/ML IV SOLN
4.0000 mg | Freq: Once | INTRAVENOUS | Status: AC
Start: 2021-11-01 — End: 2021-11-01
  Administered 2021-11-01: 4 mg via INTRAVENOUS
  Filled 2021-11-01: qty 1

## 2021-11-01 MED ORDER — MIDAZOLAM HCL 2 MG/2ML IJ SOLN: 0.5000 mg | Freq: Once | INTRAMUSCULAR | Status: DC | PRN

## 2021-11-01 MED ORDER — PROPOFOL 10 MG/ML IV BOLUS
INTRAVENOUS | Status: DC | PRN
  Administered 2021-11-01: 100 mg via INTRAVENOUS

## 2021-11-01 MED ORDER — LACTATED RINGERS IV SOLN: INTRAVENOUS | Status: DC | PRN

## 2021-11-01 MED ORDER — MEPERIDINE HCL 50 MG/ML IJ SOLN: 6.2500 mg | INTRAMUSCULAR | Status: DC | PRN

## 2021-11-01 MED ORDER — FENTANYL CITRATE (PF) 100 MCG/2ML IJ SOLN
INTRAMUSCULAR | Status: DC | PRN
  Administered 2021-11-01 (×2): 50 ug via INTRAVENOUS

## 2021-11-01 MED ORDER — FENTANYL CITRATE (PF) 100 MCG/2ML IJ SOLN
INTRAMUSCULAR | Status: AC
  Filled 2021-11-01: qty 2

## 2021-11-01 MED ORDER — SUCCINYLCHOLINE CHLORIDE 200 MG/10ML IV SOSY
PREFILLED_SYRINGE | INTRAVENOUS | Status: AC
  Filled 2021-11-01: qty 10

## 2021-11-01 MED ORDER — HYDROMORPHONE HCL 1 MG/ML IJ SOLN: 0.2500 mg | INTRAMUSCULAR | Status: DC | PRN

## 2021-11-01 MED ORDER — PROPOFOL 10 MG/ML IV BOLUS
INTRAVENOUS | Status: AC | PRN
  Administered 2021-11-01 (×2): 30 mg via INTRAVENOUS

## 2021-11-01 MED ORDER — PROPOFOL 10 MG/ML IV BOLUS
0.5000 mg/kg | Freq: Once | INTRAVENOUS | Status: AC
  Administered 2021-11-01: 30 mg via INTRAVENOUS
  Filled 2021-11-01: qty 20

## 2021-11-01 MED ORDER — SUCCINYLCHOLINE CHLORIDE 200 MG/10ML IV SOSY
PREFILLED_SYRINGE | INTRAVENOUS | Status: DC | PRN
  Administered 2021-11-01: 100 mg via INTRAVENOUS

## 2021-11-01 MED ORDER — OXYCODONE HCL 5 MG PO TABS: 5.0000 mg | ORAL_TABLET | Freq: Once | ORAL | Status: DC | PRN

## 2021-11-01 MED ORDER — MORPHINE SULFATE (PF) 4 MG/ML IV SOLN
4.0000 mg | Freq: Once | INTRAVENOUS | Status: AC
  Administered 2021-11-01: 4 mg via INTRAVENOUS
  Filled 2021-11-01: qty 1

## 2021-11-01 SURGICAL SUPPLY — 1 items: PILLOW ABDUCTION MEDIUM (MISCELLANEOUS) ×1 IMPLANT

## 2021-11-01 NOTE — ED Triage Notes (Addendum)
Pt BIB via EMS for hip dislocation on the right side. Pt has hx of hip dislocation in the same hip. Pt has prothesis. Pt was sitting on couch and went to readjust foot and felt her hip pop. Pt received 151mcg of fentanyl enroute.

## 2021-11-01 NOTE — Consult Note (Signed)
Orthopaedic Trauma Service (OTS) Consultation   Patient ID: Kayla [name] MRN: [id number] DOB/AGE: 02/03/1966 56 y.o.   Reason for Consult: Right hip dislocation Referring Physician: Dene Gentry, MD  HPI: Kayla [name] is an 56 y.o. 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 She is under care of Dr. Sharol Roussel out of [place]. 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 Hip came out while sitting in a couch which may have been a little deep for her but did not seem too much out of the ordinary.  Past Medical History:  Diagnosis Date   Anxiety    Arthritis    Asthma    daily inhaler   Back pain    Carpal tunnel syndrome, right upper limb    Dental crowns present    Depression    GERD (gastroesophageal reflux disease)    Glaucoma    History of kidney stones    Hyperlipidemia    Joint pain    Limited joint range of motion    cervical spine and jaw - due to RA   Pituitary adenoma (HCC)    Rheumatoid arthritis(714.0)    Scalp cyst 10/2016   Sciatica    SOB (shortness of breath)    Swallowing difficulty    Vitamin D deficiency     Past Surgical History:  Procedure Laterality Date   CARPAL TUNNEL RELEASE     CATARACT EXTRACTION Bilateral    as a child   CATARACT EXTRACTION Left    as a child   CESAREAN SECTION  01/01/2004   CYSTOSCOPY WITH RETROGRADE PYELOGRAM, URETEROSCOPY AND STENT PLACEMENT Right 08/28/2003   ESOPHAGEAL MANOMETRY  04/28/2004   ESOPHAGOGASTRODUODENOSCOPY  06/05/2015   HIP CLOSED REDUCTION Right 04/17/2008   dislocated total hip arthroplasty   MASS EXCISION Right 10/13/2016   Procedure: EXCISION RIGHT SCALP BONE MASS;  Surgeon: [name], DO;  Location: [facility];  Service: Plastics;  Laterality: Right;   REVISION TOTAL KNEE ARTHROPLASTY Right 08/15/2013   REVISION TOTAL KNEE ARTHROPLASTY Left    TOTAL HIP ARTHROPLASTY Right    TOTAL KNEE ARTHROPLASTY Right    TOTAL KNEE ARTHROPLASTY Left      Family History  Problem Relation Age of Onset   Hyperlipidemia Mother    Thyroid disease Mother    Cancer Mother    Depression Mother    Anxiety disorder Mother    Kidney Stones Father    Thyroid disease Father    Sleep apnea Father    Alcoholism Father     Social History:  reports that she has never smoked. She has never used smokeless tobacco. She reports current alcohol use of about 2.0 standard drinks per week. She reports that she does not use drugs.  Allergies:  Allergies  Allergen Reactions   Prednisone Other (See Comments)    SEVERE PANIC ATTACK   Methotrexate Other (See Comments)   Other Other (See Comments)   Tramadol Nausea Only   Oxycodone Hcl Rash   Sulfa Antibiotics Rash    Medications: Prior to Admission:  Medications Prior to Admission  Medication Sig Dispense Refill Last Dose   Baclofen 5 MG TABS 5 mg 2 (two) times daily.      brimonidine (ALPHAGAN) 0.2 % ophthalmic solution       celecoxib (CELEBREX) 100 MG capsule Take 100 mg by mouth daily. Take 1 tablet every other day      dorzolamide-timolol (COSOPT) 22.3-6.8 MG/ML  ophthalmic solution       esomeprazole (NEXIUM) 40 MG capsule Take 1 capsule (40 mg total) by mouth 2 (two) times daily before a meal. 60 capsule 4    HYDROcodone-acetaminophen (NORCO/VICODIN) 5-325 MG tablet 1/2 tablet      mometasone-formoterol (DULERA) 200-5 MCG/ACT AERO INHALE TWO PUFFS BY MOUTH TWICE A DAY: EVERY MORNING AND 12 HOURS AFTERWARDS 13 g 0    ORENCIA 125 MG/ML SOSY       phentermine (ADIPEX-P) 37.5 MG tablet 1/2 to 1 tablet daily. 30 tablet 0    venlafaxine XR (EFFEXOR-XR) 37.5 MG 24 hr capsule Take 37.5 mg by mouth daily.       Results for orders placed or performed during the hospital encounter of 11/01/21 (from the past 48 hour(s))  CBC with Differential     Status: Abnormal   Collection Time: 11/01/21  6:35 PM  Result Value Ref Range   WBC 13.3 (H) 4.0 - 10.5 K/uL   RBC 4.39 3.87 - 5.11 MIL/uL   Hemoglobin  13.9 12.0 - 15.0 g/dL   HCT 42.4 36.0 - 46.0 %   MCV 96.6 80.0 - 100.0 fL   MCH 31.7 26.0 - 34.0 pg   MCHC 32.8 30.0 - 36.0 g/dL   RDW 12.2 11.5 - 15.5 %   Platelets 333 150 - 400 K/uL   nRBC 0.0 0.0 - 0.2 %   Neutrophils Relative % 56 %   Neutro Abs 7.4 1.7 - 7.7 K/uL   Lymphocytes Relative 31 %   Lymphs Abs 4.1 (H) 0.7 - 4.0 K/uL   Monocytes Relative 9 %   Monocytes Absolute 1.2 (H) 0.1 - 1.0 K/uL   Eosinophils Relative 3 %   Eosinophils Absolute 0.4 0.0 - 0.5 K/uL   Basophils Relative 1 %   Basophils Absolute 0.1 0.0 - 0.1 K/uL   Immature Granulocytes 0 %   Abs Immature Granulocytes 0.04 0.00 - 0.07 K/uL    Comment: Performed at [facility], [place] [address] [place], [place] [address]  Basic metabolic panel     Status: Abnormal   Collection Time: 11/01/21  6:35 PM  Result Value Ref Range   Sodium 139 135 - 145 mmol/L   Potassium 3.9 3.5 - 5.1 mmol/L   Chloride 102 98 - 111 mmol/L   CO2 29 22 - 32 mmol/L   Glucose, Bld 124 (H) 70 - 99 mg/dL    Comment: Glucose reference range applies only to samples taken after fasting for at least 8 hours.   BUN 27 (H) 6 - 20 mg/dL   Creatinine, Ser 0.69 0.44 - 1.00 mg/dL   Calcium 9.7 8.9 - 10.3 mg/dL   GFR, Estimated >60 >60 mL/min    Comment: (NOTE) Calculated using the CKD-EPI Creatinine Equation (2021)    Anion gap 8 5 - 15    Comment: Performed at [facility], [place] [address], [place], [place] [address]  Resp Panel by RT-PCR (Flu A&B, Covid) Nasopharyngeal Swab     Status: None   Collection Time: 11/01/21  6:35 PM   Specimen: Nasopharyngeal Swab; Nasopharyngeal(NP) swabs in vial transport medium  Result Value Ref Range   SARS Coronavirus 2 by RT PCR NEGATIVE NEGATIVE    Comment: (NOTE) SARS-CoV-2 target nucleic acids are NOT DETECTED.  The SARS-CoV-2 RNA is generally detectable in upper respiratory specimens during the acute phase of infection. The lowest concentration of SARS-CoV-2 viral  copies this assay can detect is 138 copies/mL. A  negative result does not preclude SARS-Cov-2 infection and should not be used as the sole basis for treatment or other patient management decisions. A negative result may occur with  improper specimen collection/handling, submission of specimen other than nasopharyngeal swab, presence of viral mutation(s) within the areas targeted by this assay, and inadequate number of viral copies(<138 copies/mL). A negative result must be combined with clinical observations, patient history, and epidemiological information. The expected result is Negative.  Fact Sheet for Patients:  EntrepreneurPulse.com.au  Fact Sheet for Healthcare Providers:  IncredibleEmployment.be  This test is no t yet approved or cleared by the [place] FDA and  has been authorized for detection and/or diagnosis of SARS-CoV-2 by FDA under an Emergency Use Authorization (EUA). This EUA will remain  in effect (meaning this test can be used) for the duration of the COVID-19 declaration under Section 564(b)(1) of the Act, 21 U.S.C.section 360bbb-3(b)(1), unless the authorization is terminated  or revoked sooner.       Influenza A by PCR NEGATIVE NEGATIVE   Influenza B by PCR NEGATIVE NEGATIVE    Comment: (NOTE) The Xpert Xpress SARS-CoV-2/FLU/RSV plus assay is intended as an aid in the diagnosis of influenza from Nasopharyngeal swab specimens and should not be used as a sole basis for treatment. Nasal washings and aspirates are unacceptable for Xpert Xpress SARS-CoV-2/FLU/RSV testing.  Fact Sheet for Patients: EntrepreneurPulse.com.au  Fact Sheet for Healthcare Providers: IncredibleEmployment.be  This test is not yet approved or cleared by the [place] FDA and has been authorized for detection and/or diagnosis of SARS-CoV-2 by FDA under an Emergency Use Authorization (EUA). This EUA will  remain in effect (meaning this test can be used) for the duration of the COVID-19 declaration under Section 564(b)(1) of the Act, 21 U.S.C. section 360bbb-3(b)(1), unless the authorization is terminated or revoked.  Performed at [facility], [place] [address] [place], [place] [address]     DG Hip Malvin Johns or Wo Pelvis 1 View Right  Result Date: 11/01/2021 CLINICAL DATA:  Status post reduction of right hip dislocation. EXAM: DG HIP (WITH OR WITHOUT PELVIS) 1V RIGHT COMPARISON:  Radiographs of same day. FINDINGS: There is continued superolateral dislocation of the right hip prosthesis. No definite fracture is noted. IMPRESSION: Continued superolateral dislocation of right hip arthroplasty. Electronically Signed   By: Marijo Conception M.D.   On: 11/01/2021 18:24   DG Hip Unilat W or Wo Pelvis 2-3 Views Right  Result Date: 11/01/2021 CLINICAL DATA:  Hip dislocation EXAM: DG HIP (WITH OR WITHOUT PELVIS) 2-3V RIGHT COMPARISON:  10/19/2019 FINDINGS: Status post right total hip arthroplasty. Femoral component is superolaterally dislocated relative to the acetabular cup. No acute periprosthetic fracture identified. IMPRESSION: Superolateral dislocation of right total hip arthroplasty. Electronically Signed   By: Davina Poke D.O.   On: 11/01/2021 17:43    Intake/Output    None      ROS No recent fever, bleeding abnormalities, urologic dysfunction, GI problems, or weight gain. Blood pressure 133/89, pulse 73, temperature 97.9 F (36.6 C), temperature source Oral, resp. rate 15, height 4\' 11"  (1.499 m), weight 55.8 kg, SpO2 100 %. Physical Exam Super pleasant, A&O x 4 No wheezing RRR RLE  Shortening, no wounds  Edema/ swelling controlled  Sens: DPN, SPN, TN intact  Motor: EHL, FHL, and lessor toe ext and flex all intact grossly  Brisk cap refill, warm to touch, DP 1+         Assessment/Plan:  Long time Rheumatoid with  multiple joint replacements Low velocity  posterior dislocation Increased risk of complications difficulty with intubation; will wait until 9 pm (six hours) to proceed  Plan for reduction under full relaxation  Hopeful that hip abduction brace will be available to apply in OR. IF stable and/ or brace can be applied will plan for discharge to home from PACU. F/u will be with Dr. Sharol Roussel back in [place].  I discussed with the patient the risks and benefits of surgery, including the possibility of fracture, failure of reduction, nerve or vessel injury, and need for further surgery among others.  She acknowledged these risks and provided consent to proceed.   Altamese Freeport, MD [facility], [facility] [phone]  11/01/2021, 7:32 PM  [facility] [place] [address] [phone] Jenetta [phone] (F)    After 5pm and on the weekends please log on to Amion, go to orthopaedics and the look under the [facility] Call for the provider(s) on call. You can also call our office at [phone] and then follow the prompts to be connected to the call team.

## 2021-11-01 NOTE — ED Notes (Addendum)
Called lab to add on to those previously collected.

## 2021-11-01 NOTE — Transfer of Care (Signed)
Immediate Anesthesia Transfer of Care Note  Patient: Kayla [name]  Procedure(s) Performed: CLOSED REDUCTION HIP (Right: Hip)  Patient Location: PACU  Anesthesia Type:General  Level of Consciousness: awake, alert  and oriented  Airway & Oxygen Therapy: Patient Spontanous Breathing  Post-op Assessment: Report given to RN and Post -op Vital signs reviewed and stable  Post vital signs: Reviewed and stable  Last Vitals:  Vitals Value Taken Time  BP 123/87 11/01/21 2119  Temp 36.9 C 11/01/21 2118  Pulse 85 11/01/21 2121  Resp 24 11/01/21 2121  SpO2 100 % 11/01/21 2121  Vitals shown include unvalidated device data.  Last Pain:  Vitals:   11/01/21 2024  TempSrc:   PainSc: 8          Complications: No notable events documented.

## 2021-11-01 NOTE — Progress Notes (Signed)
Family brought brace but wrong one. Notified OR Therapist, sports. Pain meds given by CRNA per pain. O2 on via [place].family @ bedside

## 2021-11-01 NOTE — Discharge Instructions (Addendum)
Discharge instructions  Pink abduction pillow is for your comfort It is ok to sleep in the pillow  Ice as needed for pain and swelling   Use your home pain medications as needed   Ok to weightbear through right leg, use walker/ambulatory aides   Posterior hip precautions

## 2021-11-01 NOTE — ED Provider Notes (Signed)
[facility] DEPT   Sedation and Reduction Procedure notes   Radiology DG Hip Unilat W or Wo Pelvis 1 View Right  Result Date: 11/01/2021 CLINICAL DATA:  Status post reduction of right hip dislocation. EXAM: DG HIP (WITH OR WITHOUT PELVIS) 1V RIGHT COMPARISON:  Radiographs of same day. FINDINGS: There is continued superolateral dislocation of the right hip prosthesis. No definite fracture is noted. IMPRESSION: Continued superolateral dislocation of right hip arthroplasty. Electronically Signed   By: Marijo Conception M.D.   On: 11/01/2021 18:24   DG Hip Unilat W or Wo Pelvis 2-3 Views Right  Result Date: 11/01/2021 CLINICAL DATA:  Hip dislocation EXAM: DG HIP (WITH OR WITHOUT PELVIS) 2-3V RIGHT COMPARISON:  10/19/2019 FINDINGS: Status post right total hip arthroplasty. Femoral component is superolaterally dislocated relative to the acetabular cup. No acute periprosthetic fracture identified. IMPRESSION: Superolateral dislocation of right total hip arthroplasty. Electronically Signed   By: Davina Poke D.O.   On: 11/01/2021 17:43    Procedures .Sedation  Date/Time: 11/01/2021 6:44 PM Performed by: Valarie Merino, MD Authorized by: Valarie Merino, MD   Consent:    Consent obtained:  Verbal and written   Consent given by:  Patient   Risks discussed:  Allergic reaction, dysrhythmia, inadequate sedation, nausea, vomiting, respiratory compromise necessitating ventilatory assistance and intubation, prolonged sedation necessitating reversal and prolonged hypoxia resulting in organ damage   Alternatives discussed:  Analgesia without sedation Universal protocol:    Immediately prior to procedure, a time out was called: yes   Indications:    Procedure performed:  Dislocation reduction   Procedure necessitating sedation performed by:  Physician performing sedation Pre-sedation assessment:    Time since last food or drink:  4   ASA classification: class 2 -  patient with mild systemic disease     Mouth opening:  3 or more finger widths   Thyromental distance:  4 finger widths   Mallampati score:  I - soft palate, uvula, fauces, pillars visible   Neck mobility: reduced     Pre-sedation assessments completed and reviewed: airway patency, cardiovascular function, hydration status, mental status, nausea/vomiting, pain level, respiratory function and temperature   Immediate pre-procedure details:    Reassessment: Patient reassessed immediately prior to procedure     Reviewed: vital signs, relevant labs/tests and NPO status     Verified: bag valve mask available, emergency equipment available, intubation equipment available, IV patency confirmed, oxygen available, reversal medications available and suction available   Procedure details (see MAR for exact dosages):    Preoxygenation:  Nasal cannula   Sedation:  Propofol   Intended level of sedation: deep   Intra-procedure monitoring:  Blood pressure monitoring, cardiac monitor, continuous pulse oximetry, continuous capnometry, frequent LOC assessments and frequent vital sign checks   Intra-procedure events: none     Total Provider sedation time (minutes):  30 Post-procedure details:    Attendance: Constant attendance by certified staff until patient recovered     Recovery: Patient returned to pre-procedure baseline     Post-sedation assessments completed and reviewed: airway patency, cardiovascular function, hydration status, mental status, nausea/vomiting, pain level, respiratory function and temperature     Patient is stable for discharge or admission: yes     Procedure completion:  Tolerated well, no immediate complications Reduction of dislocation  Date/Time: 11/01/2021 6:46 PM Performed by: Valarie Merino, MD Authorized by: Valarie Merino, MD  Consent: Verbal consent obtained. Risks and benefits: risks, benefits and alternatives were discussed  Consent given by: patient Patient  understanding: patient states understanding of the procedure being performed Patient consent: the patient's understanding of the procedure matches consent given Procedure consent: procedure consent matches procedure scheduled Relevant documents: relevant documents present and verified Test results: test results available and properly labeled Site marked: the operative site was marked Imaging studies: imaging studies available Required items: required blood products, implants, devices, and special equipment available Patient identity confirmed: verbally with patient Time out: Immediately prior to procedure a "time out" was called to verify the correct patient, procedure, equipment, support staff and site/side marked as required. Preparation: Patient was prepped and draped in the usual sterile fashion.  Sedation: Patient sedated: yes Sedation type: (deep) Sedatives: propofol  Patient tolerance: patient tolerated the procedure well with no immediate complications Comments: Reduction attempt was unsuccessful.        Valarie Merino, MD 11/01/21 [phone]

## 2021-11-01 NOTE — ED Provider Notes (Signed)
[facility] DEPT Provider Note   CSN: [id number] Arrival date & time: 11/01/21  1634     History  Chief Complaint  Patient presents with   Hip Pain    Kayla [name] is a 56 y.o. female with a past medical history significant for bilateral hip replacement, bilateral knee replacement, osteoporosis, rheumatoid arthritis, and asthma who presents with concern for right hip dislocation.  Patient reports that she has had dislocated hip multiple times previously and this feels similar.  Patient reports that she was sitting on the couch when she moved to the right leg very minimally and it popped out.  Patient reports significant pain, did not take anything for pain herself prior to arrival but received 150 mcg of fentanyl in route by EMS.  Patient denies numbness or tingling of the right leg.  Patient denies cold sensation.  Patient denies other injuries, fall, head injury, does not take any blood thinners.   Hip Pain      Home Medications Prior to Admission medications   Medication Sig Start Date End Date Taking? Authorizing Provider  Baclofen 5 MG TABS 5 mg 2 (two) times daily. 08/18/21   [provider]  brimonidine (ALPHAGAN) 0.2 % ophthalmic solution  11/17/20   [provider]  celecoxib (CELEBREX) 100 MG capsule Take 100 mg by mouth daily. Take 1 tablet every other day    [provider]  dorzolamide-timolol (COSOPT) 22.3-6.8 MG/ML ophthalmic solution  11/17/20   [provider]  esomeprazole (NEXIUM) 40 MG capsule Take 1 capsule (40 mg total) by mouth 2 (two) times daily before a meal. 07/22/21   [name], DO  HYDROcodone-acetaminophen (NORCO/VICODIN) 5-325 MG tablet 1/2 tablet 11/25/20   [provider]  mometasone-formoterol (DULERA) 200-5 MCG/ACT AERO INHALE TWO PUFFS BY MOUTH TWICE A DAY: EVERY MORNING AND 12 HOURS AFTERWARDS 11/14/20   Tanda Rockers, MD  ORENCIA 125 MG/ML SOSY  01/07/21   [provider]  phentermine (ADIPEX-P) 37.5 MG tablet 1/2 to 1 tablet daily. 08/19/21   [name], DO  venlafaxine XR (EFFEXOR-XR) 37.5 MG 24 hr capsule Take 37.5 mg by mouth daily. 02/18/21   [provider]      Allergies    Prednisone, Methotrexate, Other, Tramadol, Oxycodone hcl, and Sulfa antibiotics    Review of Systems   Review of Systems  Musculoskeletal:  Positive for gait problem and joint swelling.  All other systems reviewed and are negative.  Physical Exam Updated Vital Signs BP 133/89    Pulse 73    Temp 97.9 F (36.6 C) (Oral)    Resp 15    Ht 4\' 11"  (1.499 m)    Wt 55.8 kg    SpO2 100%    BMI 24.84 kg/m  Physical Exam Vitals and nursing note reviewed.  Constitutional:      General: She is not in acute distress.    Appearance: Normal appearance.  HENT:     Head: Normocephalic and atraumatic.  Eyes:     General:        Right eye: No discharge.        Left eye: No discharge.  Cardiovascular:     Rate and Rhythm: Normal rate and regular rhythm.     Heart sounds: No murmur heard.   No friction rub. No gallop.     Comments: Intact DP, PT pulses on the right Pulmonary:     Effort: Pulmonary effort is normal.  Breath sounds: Normal breath sounds.  Abdominal:     General: Bowel sounds are normal.     Palpations: Abdomen is soft.  Musculoskeletal:     Comments: Patient with shortened appearance of right leg, palpable femoral head posterior to normal location.  Decreased range of motion of the right hip secondary to pain.  At rest it is in a externally rotated position.  Skin:    General: Skin is warm and dry.     Capillary Refill: Capillary refill takes less than 2 seconds.  Neurological:     Mental Status: She is alert and oriented to person, place, and time.  Psychiatric:        Mood and Affect: Mood normal.        Behavior: Behavior normal.    ED Results / Procedures / Treatments   Labs (all labs ordered are listed, but only abnormal  results are displayed) Labs Reviewed  CBC WITH DIFFERENTIAL/PLATELET - Abnormal; Notable for the following components:      Result Value   WBC 13.3 (*)    Lymphs Abs 4.1 (*)    Monocytes Absolute 1.2 (*)    All other components within normal limits  RESP PANEL BY RT-PCR (FLU A&B, COVID) ARPGX2  BASIC METABOLIC PANEL    EKG None  Radiology DG Hip Unilat W or Wo Pelvis 1 View Right  Result Date: 11/01/2021 CLINICAL DATA:  Status post reduction of right hip dislocation. EXAM: DG HIP (WITH OR WITHOUT PELVIS) 1V RIGHT COMPARISON:  Radiographs of same day. FINDINGS: There is continued superolateral dislocation of the right hip prosthesis. No definite fracture is noted. IMPRESSION: Continued superolateral dislocation of right hip arthroplasty. Electronically Signed   By: Marijo Conception M.D.   On: 11/01/2021 18:24   DG Hip Unilat W or Wo Pelvis 2-3 Views Right  Result Date: 11/01/2021 CLINICAL DATA:  Hip dislocation EXAM: DG HIP (WITH OR WITHOUT PELVIS) 2-3V RIGHT COMPARISON:  10/19/2019 FINDINGS: Status post right total hip arthroplasty. Femoral component is superolaterally dislocated relative to the acetabular cup. No acute periprosthetic fracture identified. IMPRESSION: Superolateral dislocation of right total hip arthroplasty. Electronically Signed   By: Davina Poke D.O.   On: 11/01/2021 17:43    Procedures Procedures    Medications Ordered in ED Medications  morphine 4 MG/ML injection 4 mg (4 mg Intravenous Given 11/01/21 1728)  propofol (DIPRIVAN) 10 mg/mL bolus/IV push 27.9 mg (30 mg Intravenous Given by Other 11/01/21 1746)  propofol (DIPRIVAN) 10 mg/mL bolus/IV push (30 mg Intravenous Given 11/01/21 1751)  morphine 4 MG/ML injection 4 mg (4 mg Intravenous Given 11/01/21 1820)    ED Course/ Medical Decision Making/ A&P                           Medical Decision Making Amount and/or Complexity of Data Reviewed Labs: ordered. Radiology: ordered.  Risk Prescription drug  management. Decision regarding hospitalization.  I discussed this case with my attending physician who cosigned this note including patient's presenting symptoms, physical exam, and planned diagnostics and interventions. Attending physician stated agreement with plan or made changes to plan which were implemented.   Attending physician assessed patient at bedside.  56 year old female with past medical history significant for bilateral hip, knee replacements, osteoporosis, asthma who presents with concern for right hip pain, shortened appearance of right leg.  The emergent differential diagnosis includes right hip dislocation versus femoral neck fracture versus hip fracture.  This is not  an exhaustive differential.  Additional history obtained from patient's friend.  Previous records reviewed including orthopedic notes from [facility] orthopedic.  Physical exam significant for apparent right hip dislocation with significant pain.  Patient has no other injuries, is in no acute distress.  I ordered and independently interpreted radiographic imaging of the right hip which shows dislocation.  I agree with the radiologist interpretation.  Patient sedated with propofol, and reduction attempted with Dr. Francia Greaves.  Unable to perform reduction under deep sedation.  Patient returned to normal state of wakefulness, postreduction films obtained, which show unsuccessful reduction as was suspected.  See Dr. Deanna Artis note for further information regarding reduction, sedation.  Consulted with Dr. Marcelino Scot who agrees to take patient to the OR for surgical reduction this evening.  We will make patient n.p.o., obtain screening lab work, and COVID swab.  Patient understands and agrees to this plan.  She had normal return of consciousness after propofol wore off. Final diagnoses:  Dislocation of right hip, initial encounter [facility])    Rx / DC Orders ED Discharge Orders     None         Dorien Chihuahua 11/01/21 1854    Valarie Merino, MD 11/01/21 [id number]

## 2021-11-01 NOTE — Anesthesia Postprocedure Evaluation (Signed)
Anesthesia Post Note  Patient: Kayla [name]  Procedure(s) Performed: CLOSED REDUCTION HIP (Right: Hip)     Patient location during evaluation: PACU Anesthesia Type: General Level of consciousness: awake and alert, patient cooperative and oriented Pain management: pain level controlled Vital Signs Assessment: post-procedure vital signs reviewed and stable Respiratory status: spontaneous breathing, nonlabored ventilation and respiratory function stable Cardiovascular status: blood pressure returned to baseline and stable Postop Assessment: no apparent nausea or vomiting and adequate PO intake Anesthetic complications: no   No notable events documented.  Last Vitals:  Vitals:   11/01/21 2127 11/01/21 2130  BP:    Pulse: 79 78  Resp: 19 16  Temp:    SpO2: 100% 96%    Last Pain:  Vitals:   11/01/21 2118  TempSrc:   PainSc: 0-No pain                 [name],E. Brenn Deziel

## 2021-11-01 NOTE — Anesthesia Preprocedure Evaluation (Addendum)
Anesthesia Evaluation  Patient identified by MRN, date of birth, ID band Patient awake    Reviewed: Allergy & Precautions, NPO status , Patient's Chart, lab work & pertinent test results  History of Anesthesia Complications Negative for: history of anesthetic complications  Airway Mallampati: IV  TM Distance: >3 FB Neck ROM: Full  Mouth opening: Limited Mouth Opening  Dental  (+) Dental Advisory Given   Pulmonary asthma , COPD,  COPD inhaler,    breath sounds clear to auscultation       Cardiovascular negative cardio ROS   Rhythm:Regular Rate:Normal     Neuro/Psych Anxiety Depression Glaucoma H/o pituitary adenoma    GI/Hepatic Neg liver ROS, GERD  Medicated and Controlled,  Endo/Other  negative endocrine ROS  Renal/GU Renal diseasestones     Musculoskeletal  (+) Arthritis , Rheumatoid disorders,    Abdominal   Peds  Hematology negative hematology ROS (+)   Anesthesia Other Findings   Reproductive/Obstetrics                            Anesthesia Physical Anesthesia Plan  ASA: 3  Anesthesia Plan: General   Post-op Pain Management: Tylenol PO (pre-op)   Induction: Intravenous  PONV Risk Score and Plan: 3 and Ondansetron and Dexamethasone  Airway Management Planned: Natural Airway and Mask  Additional Equipment: None  Intra-op Plan:   Post-operative Plan:   Informed Consent: I have reviewed the patients History and Physical, chart, labs and discussed the procedure including the risks, benefits and alternatives for the proposed anesthesia with the patient or authorized representative who has indicated his/her understanding and acceptance.     Dental advisory given  Plan Discussed with: CRNA and Surgeon  Anesthesia Plan Comments:        Anesthesia Quick Evaluation

## 2021-11-01 NOTE — Progress Notes (Signed)
Pt requested pain med for Rt hip pain 8/10 shooting pain. Notified Dr Glennon Mac, gave 1g po tylenol per order

## 2021-11-02 ENCOUNTER — Encounter (HOSPITAL_COMMUNITY): Payer: Self-pay | Admitting: Orthopedic Surgery

## 2021-11-02 NOTE — Op Note (Signed)
11/01/2021  10:10 AM  PATIENT:  Kayla [name]  [birth year] female   MEDICAL RECORD NUMBER: [id number]  PRE-OPERATIVE DIAGNOSIS:  RIGHT HIP PROSTHETIC DISLOCATION  POST-OPERATIVE DIAGNOSIS:  RIGHT HIP PROSTHETIC DISLOCATION  PROCEDURES: 1.  Closed reduction of right hip prosthetic dislocation under general anesthesia.   2.  Examination under fluoroscopy with application of stress, right hip.  SURGEON:  Altamese Saranap, MD  ASSISTANT:  Ainsley Spinner, PA-C.  ANESTHESIA:  General.  COMPLICATIONS:  None.  TOURNIQUET:  None.  DISPOSITION:  To PACU.  CONDITION:  Stable.  BRIEF SUMMARY AND INDICATIONS FOR PROCEDURE:  The patient is 56 y.o. status post multiple (4) right total hip arthroplasties by other surgeons who sustained a dislocation today while on a deep couch at a friend's resulting in severe pain, shortening, loss of motion, and deformity of the  hip.  Risks and benefits of closed reduction were discussed, including the possibility of failure, which may require open reduction or a subsequent procedure by a total joint specialist, fracture, anesthetic complications including heart attack or stroke, fracture, and multiple others.  After acknowledging these risks, consent was provided to proceed.  BRIEF SUMMARY OF PROCEDURE:  The patient was taken to the operating room where general anesthesia was induced.  A timeout was held.  We began with bringing the right hip into flexion, internal rotation and adduction.  My assistant held counter pressure on the anterior superior iliac spine of the pelvis to stabilize it.  Gentle continuous traction was applied and then the muscles felt to relax and the hip relocate.  Hip was then brought into full extension and slight abduction, demonstrating restoration of  length and rotation.  In this case, the rotation was not completely symmetric and there remained some stiffness of the hip.  The C-arm was brought in and under live fluoroscopy, stress was carefully  applied to the hip within the allowable range using different degrees of rotation, flexion, and adduction to evaluate the hip prosthesis for stability.  In this case, it did appear to be concentrically located and stable in all the areas tested at the margin of her hip restrictions.  The patient was placed into an abduction pillow and transported to the PACU in stable condition.  Ainsley Spinner, PA-C, was present and assisting, and was necessary to supply counter-force and stabilization of the pelvis to enable reduction.  PROGNOSIS:  Given her experience and on table stability we will proceed with discharge from the PACU, with a hip abduction pillow for extra precaution tonight while sleeping if wished. She is to contact her Orthopaedic Joint surgeon in the am for possible orthosis which could be continued for 6 weeks if he deems appropriate. Should another dislocation occur, I have counseled her against allowing attempts at closed reduction in the ED as risk/ benefit ratio is unfavorable. Fracture could be disastrous. She will be weightbearing as tolerated.

## 2021-11-11 ENCOUNTER — Ambulatory Visit (INDEPENDENT_AMBULATORY_CARE_PROVIDER_SITE_OTHER): Payer: Medicare Other | Admitting: Family Medicine

## 2021-11-11 ENCOUNTER — Other Ambulatory Visit: Payer: Self-pay

## 2021-11-11 ENCOUNTER — Encounter (INDEPENDENT_AMBULATORY_CARE_PROVIDER_SITE_OTHER): Payer: Self-pay | Admitting: Family Medicine

## 2021-11-11 VITALS — BP 106/71 | HR 89 | Temp 98.2°F | Ht 59.0 in | Wt 127.0 lb

## 2021-11-11 DIAGNOSIS — R632 Polyphagia: Secondary | ICD-10-CM | POA: Diagnosis not present

## 2021-11-11 DIAGNOSIS — E669 Obesity, unspecified: Secondary | ICD-10-CM | POA: Diagnosis not present

## 2021-11-11 DIAGNOSIS — M0800 Unspecified juvenile rheumatoid arthritis of unspecified site: Secondary | ICD-10-CM

## 2021-11-11 DIAGNOSIS — Z6825 Body mass index (BMI) 25.0-25.9, adult: Secondary | ICD-10-CM

## 2021-11-11 DIAGNOSIS — F3289 Other specified depressive episodes: Secondary | ICD-10-CM | POA: Diagnosis not present

## 2021-11-11 DIAGNOSIS — Z6830 Body mass index (BMI) 30.0-30.9, adult: Secondary | ICD-10-CM

## 2021-11-11 MED ORDER — PHENTERMINE HCL 37.5 MG PO TABS: ORAL_TABLET | ORAL | 0 refills | Status: DC

## 2021-11-11 MED ORDER — BACLOFEN 5 MG PO TABS: 5.0000 mg | ORAL_TABLET | Freq: Two times a day (BID) | ORAL | 0 refills | Status: DC

## 2021-11-12 NOTE — Progress Notes (Signed)
Chief Complaint:   OBESITY Kayla [name] is here to discuss her progress with her obesity treatment plan along with follow-up of her obesity related diagnoses. See Medical Weight Management Flowsheet for complete bioelectrical impedance results.  Today's visit was #: 12 Starting weight: 148 lbs Starting date: 01/08/2021 Weight change since last visit: +2 lbs Total lbs lost to date: 21 lbs Total weight loss percentage to date: -14.19%  Nutrition Plan: Keeping a food journal and adhering to recommended goals of 1000 calories and 85 grams of protein daily for 20% of the time. Activity: Movement therapy/yoga for 60 minutes 4 times per week.  Anti-obesity medications: phentermine 18.75 to 37.5 mg daily. Reported side effects: None.  Interim History: Kayla [name] says her knee surgery was canceled.  She says she has been ordering more [employer].  She is ready for a restart.  Assessment/Plan:   1. Polyphagia Not at goal. Current treatment: phentermine 18.75 to 37.5 mg daily.    Plan:  Continue phentermine.  Will refill today. She will continue to focus on protein-rich, low simple carbohydrate foods. We reviewed the importance of hydration, regular exercise for stress reduction, and restorative sleep.  - Refill phentermine (ADIPEX-P) 37.5 MG tablet; 1/2 to 1 tablet daily.  Dispense: 90 tablet; Refill: 0  Having again reminded the patient of the "off label" use of Phentermine beyond three consecutive months, and again discussing the risks, benefits, contraindications, and limitations of it's use; given it's role in the successful treatment of obesity thus far and lack of adverse effect, patient has expressed desire and given informed verbal consent to continue use.   I have consulted the [place] Controlled Substances Registry for this patient, and feel the risk/benefit ratio today is favorable for proceeding with this prescription for a controlled substance. The patient understands monitoring parameters and red  flags.   2. JRA (juvenile rheumatoid arthritis) ([place]) Beatryce is taking baclofen 5 mg twice daily.  Will refill today.  - Refill Baclofen 5 MG TABS; Take 5 mg by mouth 2 (two) times daily.  Dispense: 30 tablet; Refill: 0  3. Other depression, with emotional eating Controlled. Medication: Effexor-XR 37.5 mg daily.   Plan:  Discussed cues and consequences, how thoughts affect eating, model of thoughts, feelings, and behaviors, and strategies for change by focusing on the cue. Discussed cognitive distortions, coping thoughts, and how to change your thoughts.  4. Obesity with current BMI of 25.8  Course: Kayla [name] is currently in the action stage of change. As such, her goal is to continue with weight loss efforts.   Nutrition goals: She has agreed to keeping a food journal and adhering to recommended goals of 1000 calories and 85 grams of protein.   Exercise goals:  As is.  Behavioral modification strategies: increasing lean protein intake, decreasing simple carbohydrates, increasing vegetables, and increasing water intake.  [name] has agreed to follow-up with our clinic in 4 weeks. She was informed of the importance of frequent follow-up visits to maximize her success with intensive lifestyle modifications for her multiple health conditions.   Objective:   Blood pressure 106/71, pulse 89, temperature 98.2 F (36.8 C), temperature source Oral, height 4\' 11"  (1.499 m), weight 127 lb (57.6 kg), SpO2 99 %. Body mass index is 25.65 kg/m.  General: Cooperative, alert, well developed, in no acute distress. HEENT: Conjunctivae and lids unremarkable. Cardiovascular: Regular rhythm.  Lungs: Normal work of breathing. Neurologic: No focal deficits.   Lab Results  Component Value Date   CREATININE 0.69 11/01/2021  BUN 27 (H) 11/01/2021   NA 139 11/01/2021   K 3.9 11/01/2021   CL 102 11/01/2021   CO2 29 11/01/2021   Lab Results  Component Value Date   ALT 16 11/25/2020   AST 14 11/25/2020    ALKPHOS 75 11/25/2020   Lab Results  Component Value Date   TSH 2.06 11/25/2020   Lab Results  Component Value Date   CHOL 255 (A) 11/25/2020   HDL 58 11/25/2020   LDLCALC 148 11/25/2020   TRIG 270 (A) 11/25/2020   Lab Results  Component Value Date   WBC 13.3 (H) 11/01/2021   HGB 13.9 11/01/2021   HCT 42.4 11/01/2021   MCV 96.6 11/01/2021   PLT 333 11/01/2021   Attestation Statements:   Reviewed by clinician on day of visit: allergies, medications, problem list, medical history, surgical history, family history, social history, and previous encounter notes.  I, Water quality scientist, CMA, am acting as transcriptionist for [name], DO  I have reviewed the above documentation for accuracy and completeness, and I agree with the above. -  [name], DO, MS, FAAFP, DABOM - Family and Bariatric Medicine.

## 2021-11-16 DIAGNOSIS — H4041X1 Glaucoma secondary to eye inflammation, right eye, mild stage: Secondary | ICD-10-CM | POA: Diagnosis not present

## 2021-11-16 DIAGNOSIS — H4042X3 Glaucoma secondary to eye inflammation, left eye, severe stage: Secondary | ICD-10-CM | POA: Diagnosis not present

## 2021-11-26 DIAGNOSIS — Z8639 Personal history of other endocrine, nutritional and metabolic disease: Secondary | ICD-10-CM | POA: Diagnosis not present

## 2021-11-26 DIAGNOSIS — Z1389 Encounter for screening for other disorder: Secondary | ICD-10-CM | POA: Diagnosis not present

## 2021-11-26 DIAGNOSIS — E559 Vitamin D deficiency, unspecified: Secondary | ICD-10-CM | POA: Diagnosis not present

## 2021-11-26 DIAGNOSIS — M810 Age-related osteoporosis without current pathological fracture: Secondary | ICD-10-CM | POA: Diagnosis not present

## 2021-11-26 DIAGNOSIS — Z0000 Encounter for general adult medical examination without abnormal findings: Secondary | ICD-10-CM | POA: Diagnosis not present

## 2021-11-26 DIAGNOSIS — D352 Benign neoplasm of pituitary gland: Secondary | ICD-10-CM | POA: Diagnosis not present

## 2021-11-26 DIAGNOSIS — E7800 Pure hypercholesterolemia, unspecified: Secondary | ICD-10-CM | POA: Diagnosis not present

## 2021-11-26 DIAGNOSIS — M069 Rheumatoid arthritis, unspecified: Secondary | ICD-10-CM | POA: Diagnosis not present

## 2021-11-26 DIAGNOSIS — J4530 Mild persistent asthma, uncomplicated: Secondary | ICD-10-CM | POA: Diagnosis not present

## 2021-11-26 DIAGNOSIS — K219 Gastro-esophageal reflux disease without esophagitis: Secondary | ICD-10-CM | POA: Diagnosis not present

## 2021-11-27 DIAGNOSIS — M0800 Unspecified juvenile rheumatoid arthritis of unspecified site: Secondary | ICD-10-CM | POA: Diagnosis not present

## 2021-11-27 DIAGNOSIS — Z7960 Long term (current) use of unspecified immunomodulators and immunosuppressants: Secondary | ICD-10-CM | POA: Diagnosis not present

## 2021-12-01 DIAGNOSIS — E221 Hyperprolactinemia: Secondary | ICD-10-CM | POA: Diagnosis not present

## 2021-12-01 DIAGNOSIS — M069 Rheumatoid arthritis, unspecified: Secondary | ICD-10-CM | POA: Diagnosis not present

## 2021-12-01 DIAGNOSIS — Z8349 Family history of other endocrine, nutritional and metabolic diseases: Secondary | ICD-10-CM | POA: Diagnosis not present

## 2021-12-01 DIAGNOSIS — Z8262 Family history of osteoporosis: Secondary | ICD-10-CM | POA: Diagnosis not present

## 2021-12-01 DIAGNOSIS — Z8639 Personal history of other endocrine, nutritional and metabolic disease: Secondary | ICD-10-CM | POA: Diagnosis not present

## 2021-12-01 DIAGNOSIS — M810 Age-related osteoporosis without current pathological fracture: Secondary | ICD-10-CM | POA: Diagnosis not present

## 2021-12-08 ENCOUNTER — Other Ambulatory Visit: Payer: Self-pay | Admitting: Internal Medicine

## 2021-12-08 DIAGNOSIS — Z1231 Encounter for screening mammogram for malignant neoplasm of breast: Secondary | ICD-10-CM

## 2021-12-10 DIAGNOSIS — D1801 Hemangioma of skin and subcutaneous tissue: Secondary | ICD-10-CM | POA: Diagnosis not present

## 2021-12-10 DIAGNOSIS — D485 Neoplasm of uncertain behavior of skin: Secondary | ICD-10-CM | POA: Diagnosis not present

## 2021-12-14 ENCOUNTER — Other Ambulatory Visit: Payer: Self-pay

## 2021-12-14 ENCOUNTER — Ambulatory Visit (INDEPENDENT_AMBULATORY_CARE_PROVIDER_SITE_OTHER): Payer: Medicare Other | Admitting: Family Medicine

## 2021-12-14 ENCOUNTER — Encounter (INDEPENDENT_AMBULATORY_CARE_PROVIDER_SITE_OTHER): Payer: Self-pay | Admitting: Family Medicine

## 2021-12-14 VITALS — BP 130/72 | HR 100 | Temp 97.9°F | Ht 59.0 in | Wt 127.0 lb

## 2021-12-14 DIAGNOSIS — H4041X1 Glaucoma secondary to eye inflammation, right eye, mild stage: Secondary | ICD-10-CM | POA: Diagnosis not present

## 2021-12-14 DIAGNOSIS — M2550 Pain in unspecified joint: Secondary | ICD-10-CM | POA: Diagnosis not present

## 2021-12-14 DIAGNOSIS — Z6825 Body mass index (BMI) 25.0-25.9, adult: Secondary | ICD-10-CM

## 2021-12-14 DIAGNOSIS — E669 Obesity, unspecified: Secondary | ICD-10-CM

## 2021-12-14 DIAGNOSIS — R632 Polyphagia: Secondary | ICD-10-CM | POA: Diagnosis not present

## 2021-12-14 DIAGNOSIS — H4089 Other specified glaucoma: Secondary | ICD-10-CM

## 2021-12-14 DIAGNOSIS — Z6830 Body mass index (BMI) 30.0-30.9, adult: Secondary | ICD-10-CM

## 2021-12-14 DIAGNOSIS — H4042X3 Glaucoma secondary to eye inflammation, left eye, severe stage: Secondary | ICD-10-CM | POA: Diagnosis not present

## 2021-12-16 NOTE — Progress Notes (Signed)
Chief Complaint:   OBESITY Kayla [name] is here to discuss her progress with her obesity treatment plan along with follow-up of her obesity related diagnoses. See Medical Weight Management Flowsheet for complete bioelectrical impedance results.  Today's visit was #: 13 Starting weight: 148 lbs Starting date: 01/08/2021 Weight change since last visit: 0 Total lbs lost to date: 21 lbs Total weight loss percentage to date: -14.19%  Nutrition Plan: Keeping a food journal and adhering to recommended goals of 1000 calories and 85 grams of protein daily for 0% of the time. Activity: Movement therapy/yoga 4 days per week. Anti-obesity medications: phentermine 37.5 mg daily. Reported side effects: None.  Interim History: Kayla [name] says she has an eye appointment today.  She says that her glaucoma is worsening.  She also reports that she is scheduled for a left total knee revision on June 15, 2022.    Assessment/Plan:   1. Polyphagia Controlled. Current treatment: phentermine 37.5 mg daily.    Plan:  She will continue to focus on protein-rich, low simple carbohydrate foods. We reviewed the importance of hydration, regular exercise for stress reduction, and restorative sleep.  2. Multiple joint pain She is being followed by Orthopedics and Rheumatology.  Will follow along as it pertains to her weight loss journey.  3. Other specified glaucoma We discussed the increased risk with phentermine.  She will discuss with her ophthalmologist.   4. Obesity with current BMI of 25.7  Course: Kayla [name] is currently in the action stage of change. As such, her goal is to continue with weight loss efforts.   Nutrition goals: She has agreed to keeping a food journal and adhering to recommended goals of 1000 calories and 85 grams of protein.   Exercise goals:  As is.  Behavioral modification strategies: increasing lean protein intake, decreasing simple carbohydrates, and increasing vegetables.  Kayla [name] has  agreed to follow-up with our clinic in 4 weeks. She was informed of the importance of frequent follow-up visits to maximize her success with intensive lifestyle modifications for her multiple health conditions.   Objective:   Blood pressure 130/72, pulse 100, temperature 97.9 F (36.6 C), temperature source Oral, height '4\' 11"'$  (1.499 m), weight 127 lb (57.6 kg), SpO2 97 %. Body mass index is 25.65 kg/m.  General: Cooperative, alert, well developed, in no acute distress. HEENT: Conjunctivae and lids unremarkable. Cardiovascular: Regular rhythm.  Lungs: Normal work of breathing. Neurologic: No focal deficits.   Lab Results  Component Value Date   CREATININE 0.69 11/01/2021   BUN 27 (H) 11/01/2021   NA 139 11/01/2021   K 3.9 11/01/2021   CL 102 11/01/2021   CO2 29 11/01/2021   Lab Results  Component Value Date   ALT 16 11/25/2020   AST 14 11/25/2020   ALKPHOS 75 11/25/2020   Lab Results  Component Value Date   TSH 2.06 11/25/2020   Lab Results  Component Value Date   CHOL 255 (A) 11/25/2020   HDL 58 11/25/2020   LDLCALC 148 11/25/2020   TRIG 270 (A) 11/25/2020   Lab Results  Component Value Date   WBC 13.3 (H) 11/01/2021   HGB 13.9 11/01/2021   HCT 42.4 11/01/2021   MCV 96.6 11/01/2021   PLT 333 11/01/2021   Attestation Statements:   Reviewed by clinician on day of visit: allergies, medications, problem list, medical history, surgical history, family history, social history, and previous encounter notes.  I, Water quality scientist, CMA, am acting as Location manager for [employer], DO  I have reviewed the above documentation for accuracy and completeness, and I agree with the above. -  [name], DO, MS, FAAFP, DABOM - Family and Bariatric Medicine.

## 2021-12-29 ENCOUNTER — Ambulatory Visit
Admission: RE | Admit: 2021-12-29 | Discharge: 2021-12-29 | Disposition: A | Discharge status: Home / Self Care | Payer: Medicare Other | Source: Ambulatory Visit | Attending: Internal Medicine | Admitting: Internal Medicine

## 2021-12-29 ENCOUNTER — Other Ambulatory Visit: Payer: Self-pay

## 2021-12-29 DIAGNOSIS — Z1231 Encounter for screening mammogram for malignant neoplasm of breast: Secondary | ICD-10-CM

## 2022-01-11 DIAGNOSIS — H4042X3 Glaucoma secondary to eye inflammation, left eye, severe stage: Secondary | ICD-10-CM | POA: Diagnosis not present

## 2022-01-11 DIAGNOSIS — H4041X1 Glaucoma secondary to eye inflammation, right eye, mild stage: Secondary | ICD-10-CM | POA: Diagnosis not present

## 2022-01-12 ENCOUNTER — Telehealth (INDEPENDENT_AMBULATORY_CARE_PROVIDER_SITE_OTHER): Payer: Medicare Other | Admitting: Family Medicine

## 2022-01-12 ENCOUNTER — Encounter (INDEPENDENT_AMBULATORY_CARE_PROVIDER_SITE_OTHER): Payer: Self-pay | Admitting: Family Medicine

## 2022-01-12 DIAGNOSIS — R948 Abnormal results of function studies of other organs and systems: Secondary | ICD-10-CM

## 2022-01-12 DIAGNOSIS — M2550 Pain in unspecified joint: Secondary | ICD-10-CM | POA: Diagnosis not present

## 2022-01-12 DIAGNOSIS — H4010X0 Unspecified open-angle glaucoma, stage unspecified: Secondary | ICD-10-CM | POA: Diagnosis not present

## 2022-01-12 DIAGNOSIS — Z6825 Body mass index (BMI) 25.0-25.9, adult: Secondary | ICD-10-CM

## 2022-01-12 DIAGNOSIS — E669 Obesity, unspecified: Secondary | ICD-10-CM | POA: Diagnosis not present

## 2022-01-12 NOTE — Progress Notes (Signed)
    TeleHealth Visit:  Due to the COVID-19 pandemic, this visit was completed with telemedicine (audio/video) technology to reduce patient and provider exposure as well as to preserve personal protective equipment.   Kayla [name] has verbally consented to this TeleHealth visit. The patient is located at home, the provider is located at home. The participants in this visit include the listed provider and patient. The visit was conducted today via MyChart video.  OBESITY Kayla [name] is here to discuss her progress with her obesity treatment plan along with follow-up of her obesity related diagnoses.   Today's visit was #: 14 Starting weight: 148 lbs Starting date: 01/08/2021 Today's date: 01/12/2022  Does not weight at home.  Nutrition Plan: keeping a food journal and adhering to recommended goals of 1000 calories and 85 grams of protein for 80% of time .  Anti-obesity medications: Phentermine 37.5 mg daily. Reported side effects: None. Hunger is well controlled. Cravings are well controlled.  Activity: Movement therapy 2 times per week. Stress: Kayla [name] reports that her Glaucoma has worsened recently.  Assessment/Plan:   Diagnoses and all orders for this visit:  Multiple joint pain  Abnormal metabolism, slow  Glaucoma assoicated with ocular disorder, followed by Ophthalmology  Obesity with current BMI of 25.7   Kayla [name] is currently in the action stage of change. As such, her goal is to continue with weight loss efforts. She has agreed to keeping a food journal and adhering to recommended goals of 1000 calories and 85 protein.   Exercise goals: As is.  Behavioral modification strategies: increasing lean protein intake and increasing water intake.  Kayla [name] has agreed to follow-up with our clinic in 4 weeks. She was informed of the importance of frequent follow-up visits to maximize her success with intensive lifestyle modifications for her multiple health conditions.  Objective:   VITALS: Per  patient if applicable, see vitals. GENERAL: Alert and in no acute distress. CARDIOPULMONARY: No increased WOB. Speaking in clear sentences.  PSYCH: Pleasant and cooperative. Speech normal rate and rhythm. Affect is appropriate. Insight and judgement are appropriate. Attention is focused, linear, and appropriate.  NEURO: Oriented as arrived to appointment on time with no prompting.   Lab Results  Component Value Date   CREATININE 0.69 11/01/2021   BUN 27 (H) 11/01/2021   NA 139 11/01/2021   K 3.9 11/01/2021   CL 102 11/01/2021   CO2 29 11/01/2021   Lab Results  Component Value Date   ALT 16 11/25/2020   AST 14 11/25/2020   ALKPHOS 75 11/25/2020   No results found for: HGBA1C No results found for: INSULIN Lab Results  Component Value Date   TSH 2.06 11/25/2020   Lab Results  Component Value Date   CHOL 255 (A) 11/25/2020   HDL 58 11/25/2020   LDLCALC 148 11/25/2020   TRIG 270 (A) 11/25/2020   Lab Results  Component Value Date   WBC 13.3 (H) 11/01/2021   HGB 13.9 11/01/2021   HCT 42.4 11/01/2021   MCV 96.6 11/01/2021   PLT 333 11/01/2021   No results found for: IRON, TIBC, FERRITIN  Attestation Statements:   Reviewed by clinician on day of visit: allergies, medications, problem list, medical history, surgical history, family history, social history, and previous encounter notes.

## 2022-01-22 DIAGNOSIS — H5203 Hypermetropia, bilateral: Secondary | ICD-10-CM | POA: Diagnosis not present

## 2022-01-22 DIAGNOSIS — H524 Presbyopia: Secondary | ICD-10-CM | POA: Diagnosis not present

## 2022-01-28 ENCOUNTER — Other Ambulatory Visit: Payer: Self-pay | Admitting: Internal Medicine

## 2022-02-04 DIAGNOSIS — R632 Polyphagia: Secondary | ICD-10-CM | POA: Diagnosis not present

## 2022-02-04 DIAGNOSIS — M810 Age-related osteoporosis without current pathological fracture: Secondary | ICD-10-CM | POA: Diagnosis not present

## 2022-02-04 DIAGNOSIS — M069 Rheumatoid arthritis, unspecified: Secondary | ICD-10-CM | POA: Diagnosis not present

## 2022-02-05 DIAGNOSIS — L578 Other skin changes due to chronic exposure to nonionizing radiation: Secondary | ICD-10-CM | POA: Diagnosis not present

## 2022-02-05 DIAGNOSIS — D2261 Melanocytic nevi of right upper limb, including shoulder: Secondary | ICD-10-CM | POA: Diagnosis not present

## 2022-02-05 DIAGNOSIS — L810 Postinflammatory hyperpigmentation: Secondary | ICD-10-CM | POA: Diagnosis not present

## 2022-02-05 DIAGNOSIS — L814 Other melanin hyperpigmentation: Secondary | ICD-10-CM | POA: Diagnosis not present

## 2022-02-05 DIAGNOSIS — D2272 Melanocytic nevi of left lower limb, including hip: Secondary | ICD-10-CM | POA: Diagnosis not present

## 2022-02-05 DIAGNOSIS — D225 Melanocytic nevi of trunk: Secondary | ICD-10-CM | POA: Diagnosis not present

## 2022-02-05 DIAGNOSIS — L821 Other seborrheic keratosis: Secondary | ICD-10-CM | POA: Diagnosis not present

## 2022-02-05 DIAGNOSIS — L738 Other specified follicular disorders: Secondary | ICD-10-CM | POA: Diagnosis not present

## 2022-02-16 DIAGNOSIS — K136 Irritative hyperplasia of oral mucosa: Secondary | ICD-10-CM | POA: Diagnosis not present

## 2022-02-17 ENCOUNTER — Telehealth: Payer: Self-pay | Admitting: Internal Medicine

## 2022-02-18 MED ORDER — DULERA 200-5 MCG/ACT IN AERO: INHALATION_SPRAY | RESPIRATORY_TRACT | 0 refills | Status: DC

## 2022-02-18 NOTE — Telephone Encounter (Signed)
Called and spoke with patient. She has an appointment set up with MW on Monday. Advised patient I would send prescription in. Patient verified pharmacy.   Nothing further needed.

## 2022-02-22 ENCOUNTER — Ambulatory Visit: Payer: Medicare Other | Admitting: Internal Medicine

## 2022-02-22 ENCOUNTER — Encounter: Payer: Self-pay | Admitting: Internal Medicine

## 2022-02-22 DIAGNOSIS — J4550 Severe persistent asthma, uncomplicated: Secondary | ICD-10-CM

## 2022-02-22 MED ORDER — DULERA 200-5 MCG/ACT IN AERO
INHALATION_SPRAY | RESPIRATORY_TRACT | 11 refills | Status: DC
Start: 2022-02-22 — End: 2023-05-16

## 2022-02-22 NOTE — Assessment & Plan Note (Signed)
Never smoker  PFTs 01/28/06 FEV1 44%  ratio 39% diffusing capacity  116% with 8% improvement after B2 - PFTs 06/25/11 FEV1 0.96 (40%)   Ratio 37  - PFT's  05/30/2015  FEV1 0.90 (38 % ) ratio 48  No % improvement from saba with DLCO  127 %    p dulera 200 that am  - 06/02/2016 p extensive coaching HFA effectiveness =  90% - rec add pepcid ac 20 mg hs to see if helps am "wheeze/sob      - PFT's  07/13/2017  FEV1 0.85 (36 % ) ratio 44   p 9 % improvement from saba p dulera 200 prior to study with DLCO  114/113c % corrects to 148  % for alv volume    - .PFT's  09/13/2018  FEV1 0.88 (38 % ) ratio 48  p 3 % improvement from saba p qvar and symb prior to study with DLCO  112 % corrects to 145  % for alv volume   - 04/04/2019  After extensive coaching inhaler device,  effectiveness =    75%   (short Ti) - 10/10/2019   Walked RA x two laps =  approx 526ft @ avg pace - stopped due to end of study s sob  with sats of 95% at the end of the study.  All goals of chronic asthma control met including optimal (though not necessarily nl)  function and elimination of symptoms with minimal need for rescue therapy on dulera 200 2bid   Contingencies discussed in full including contacting this office immediately if not controlling the symptoms using the rule of two's.   F/u can be yearly - sooner prn          Each maintenance medication was reviewed in detail including emphasizing most importantly the difference between maintenance and prns and under what circumstances the prns are to be triggered using an action plan format where appropriate.  Total time for H and P, chart review, counseling, reviewing hfa device(s) and generating customized AVS unique to this office visit / same day charting = 23 min

## 2022-02-22 NOTE — Patient Instructions (Signed)
No change medications   Please schedule a follow up visit in 12  months but call sooner if needed    

## 2022-02-22 NOTE — Progress Notes (Signed)
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  She weaned herself off of prednisone successfully   in 11/2007     History of Present Illness  03/22/2012 f/u ov/Kayla [name] no longer on prednisone at all for RA cc persistent chronic doe x walking dog x sev minutes, worse with hills, does fine at rest, using saba sev times per day with some improvement but not relief of sob despite advair 500 rec Dulera 200 Take 2 puffs first thing in am and then another 2 puffs about 12 hours later.  Only use your albuterol (maxair) as a rescue medication   Work on inhaler technique:    04/04/2019  f/u ov/Kayla [name] re:   RA bronchiolitis vs asthma  On orcencia /dulera 200 2bid and better p rehab Chief Complaint  Patient presents with   Follow-up    Breathing is doing some better since the last visit. She is out of her qvar inhaler and albuterol inhaler.   Dyspnea:  Walking up to a mile / ex bike up to 2 miles on dulera 200  Cough: none Sleeping: able to lie flat flat/ one pillow SABA use: doesn't have one  rec Plan A = Automatic = dulera 200 Take 2 puffs first thing in am and then another 2 puffs about 12 hours later.  Work on inhaler technique:  Plan B = Backup Only use your albuterol inhaler   10/10/2019  f/u ov/Kayla [name] re: RA bronchiolitis doing better on Orencia / dulera 200 2bid Chief Complaint  Patient presents with   Follow-up    Breathing is about the same. No new co's. She has not had a rescue inhaler in the past several months.  Dyspnea:  Not limited by breathing from desired activities but by orthopedic issue / does recumbent bike up 30 min / not monitoring sats  Cough: none  Sleeping: none flat/ one pillow  SABA use: doesn't  have one  02: none   Rec No change in medications  Only use your albuterol (Proair) as a rescue medication    02/22/2022  f/u ov/Kayla [name] re: RA bronchiolitis maint on dulera 200 2bid and orencia   Chief Complaint  Patient presents with   Follow-up  Dyspnea:  no aerobics / able to walk anywhere around neighborhood ok  Cough: none  Sleeping: flat bed one pillow  SABA use: none 02: none  Covid status: vax  to the max      No obvious day to day or daytime variability or assoc excess/ purulent sputum or mucus plugs or hemoptysis or cp or chest tightness, subjective wheeze or overt sinus or hb symptoms.   Sleeping  without nocturnal  or early am exacerbation  of respiratory  c/o's or need for noct saba. Also denies any obvious fluctuation of symptoms with weather or environmental changes or other aggravating or alleviating factors except as outlined above   No unusual exposure hx or h/o childhood pna/ asthma or knowledge of premature birth.  Current Allergies, Complete Past Medical History, Past Surgical History, Family History, and Social History were reviewed in [employer] record.  ROS  The following are not active complaints unless bolded Hoarseness, sore throat, dysphagia, dental problems, itching,  sneezing,  nasal congestion or discharge of excess mucus or purulent secretions, ear ache,   fever, chills, sweats,  intended wt loss or wt gain, classically pleuritic or exertional cp,  orthopnea pnd or arm/hand swelling  or leg swelling, presyncope, palpitations, abdominal pain, anorexia, nausea, vomiting, diarrhea  or change in bowel habits or change in bladder habits, change in stools or change in urine, dysuria, hematuria,  rash, arthralgias, visual complaints, headache, numbness, weakness or ataxia or problems with walking or coordination,  change in mood or  memory.        Current Meds  Medication Sig   alendronate (FOSAMAX) 70 MG tablet Take 70 mg by mouth once  a week.   Baclofen 5 MG TABS Take 5 mg by mouth 2 (two) times daily.   brimonidine (ALPHAGAN) 0.2 % ophthalmic solution    celecoxib (CELEBREX) 100 MG capsule Take 100 mg by mouth daily. Take 1 tablet every other day   dorzolamide-timolol (COSOPT) 22.3-6.8 MG/ML ophthalmic solution    esomeprazole (NEXIUM) 40 MG capsule Take 40 mg by mouth daily as needed.   HYDROcodone-acetaminophen (NORCO/VICODIN) 5-325 MG tablet As needed   latanoprost (XALATAN) 0.005 % ophthalmic solution SMARTSIG:In Eye(s)   mometasone-formoterol (DULERA) 200-5 MCG/ACT AERO INHALE TWO PUFFS BY MOUTH TWICE A DAY: EVERY MORNING AND 12 HOURS AFTERWARDS   ORENCIA 125 MG/ML SOSY    phentermine (ADIPEX-P) 37.5 MG tablet 1/2 to 1 tablet daily.   venlafaxine XR (EFFEXOR-XR) 37.5 MG 24 hr capsule Take 37.5 mg by mouth daily.                 Past Medical History:  COUGH (ICD-786.2)  RHINITIS, CHRONIC (ICD-472.0)  ASTHMA (ICD-493.90)  ARTHRITIS, RHEUMATOID, SEVERE (ICD-714.0)............................................Marland KitchenSchriver @ [facility]  - Sept 17 2009 started daily prednisone > tapered off        Objective:   Physical Exam   Wts  02/22/2022   131  10/10/2019     154  04/04/2019     146 10/11/2014          141 > 06/03/2015  132 >  06/02/2016   150  > 07/13/2017   147 > 09/13/2018   150     03/22/12 131 lb 12.8 oz (59.784 kg)  08/19/09 125 lb 2.1 oz (56.759 kg)  11/13/08 122 lb 4 oz (55.452 kg)       Vital signs reviewed  02/22/2022  - Note at rest 02 sats  99% on RA   General appearance:    amb wf nad       HEENT : Oropharynx  clear  Nasal turbintes nl   NECK :  without  appent JVD/ palpable Nodes/TM    LUNGS: no acc muscle use,  Mildly kyphotic  contour chest which is clear to A and P bilaterally without cough on insp or exp maneuvers   CV:  RRR  no s3 or murmur or increase in P2, and no edema   ABD:  soft and nontender with nl inspiratory excursion in the supine position. No bruits or organomegaly  appreciated   MS:  Nl gait/ ext warm without deformities Or obvious joint restrictions  calf tenderness, cyanosis or clubbing     SKIN: warm and dry without lesions    NEURO:  alert, approp, nl sensorium with  no motor or cerebellar deficits apparent.                  Assessment & Plan:

## 2022-03-04 DIAGNOSIS — M0800 Unspecified juvenile rheumatoid arthritis of unspecified site: Secondary | ICD-10-CM | POA: Diagnosis not present

## 2022-03-04 DIAGNOSIS — R632 Polyphagia: Secondary | ICD-10-CM | POA: Diagnosis not present

## 2022-05-12 ENCOUNTER — Encounter (INDEPENDENT_AMBULATORY_CARE_PROVIDER_SITE_OTHER): Payer: Self-pay

## 2022-05-24 DIAGNOSIS — R632 Polyphagia: Secondary | ICD-10-CM | POA: Diagnosis not present

## 2022-05-24 DIAGNOSIS — M0800 Unspecified juvenile rheumatoid arthritis of unspecified site: Secondary | ICD-10-CM | POA: Diagnosis not present

## 2022-05-31 DIAGNOSIS — Z96652 Presence of left artificial knee joint: Secondary | ICD-10-CM | POA: Diagnosis not present

## 2022-05-31 DIAGNOSIS — T84023D Instability of internal left knee prosthesis, subsequent encounter: Secondary | ICD-10-CM | POA: Diagnosis not present

## 2022-05-31 DIAGNOSIS — Z01818 Encounter for other preprocedural examination: Secondary | ICD-10-CM | POA: Diagnosis not present

## 2022-06-08 DIAGNOSIS — T84023D Instability of internal left knee prosthesis, subsequent encounter: Secondary | ICD-10-CM | POA: Diagnosis not present

## 2022-06-08 DIAGNOSIS — Z96652 Presence of left artificial knee joint: Secondary | ICD-10-CM | POA: Diagnosis not present

## 2022-06-08 DIAGNOSIS — T84093D Other mechanical complication of internal left knee prosthesis, subsequent encounter: Secondary | ICD-10-CM | POA: Diagnosis not present

## 2022-06-08 DIAGNOSIS — Z7951 Long term (current) use of inhaled steroids: Secondary | ICD-10-CM | POA: Diagnosis not present

## 2022-06-08 DIAGNOSIS — Z888 Allergy status to other drugs, medicaments and biological substances status: Secondary | ICD-10-CM | POA: Diagnosis not present

## 2022-06-08 DIAGNOSIS — T84093A Other mechanical complication of internal left knee prosthesis, initial encounter: Secondary | ICD-10-CM | POA: Diagnosis not present

## 2022-06-08 DIAGNOSIS — M25362 Other instability, left knee: Secondary | ICD-10-CM | POA: Diagnosis not present

## 2022-06-08 DIAGNOSIS — M85862 Other specified disorders of bone density and structure, left lower leg: Secondary | ICD-10-CM | POA: Diagnosis not present

## 2022-06-08 DIAGNOSIS — Z471 Aftercare following joint replacement surgery: Secondary | ICD-10-CM | POA: Diagnosis not present

## 2022-06-08 DIAGNOSIS — J45909 Unspecified asthma, uncomplicated: Secondary | ICD-10-CM | POA: Diagnosis not present

## 2022-06-08 DIAGNOSIS — Z886 Allergy status to analgesic agent status: Secondary | ICD-10-CM | POA: Diagnosis not present

## 2022-06-08 DIAGNOSIS — Z882 Allergy status to sulfonamides status: Secondary | ICD-10-CM | POA: Diagnosis not present

## 2022-06-08 DIAGNOSIS — Z791 Long term (current) use of non-steroidal anti-inflammatories (NSAID): Secondary | ICD-10-CM | POA: Diagnosis not present

## 2022-06-08 DIAGNOSIS — K219 Gastro-esophageal reflux disease without esophagitis: Secondary | ICD-10-CM | POA: Diagnosis not present

## 2022-06-25 DIAGNOSIS — Z96652 Presence of left artificial knee joint: Secondary | ICD-10-CM | POA: Diagnosis not present

## 2022-06-25 DIAGNOSIS — M25662 Stiffness of left knee, not elsewhere classified: Secondary | ICD-10-CM | POA: Diagnosis not present

## 2022-06-28 DIAGNOSIS — Z96652 Presence of left artificial knee joint: Secondary | ICD-10-CM | POA: Diagnosis not present

## 2022-06-28 DIAGNOSIS — M25662 Stiffness of left knee, not elsewhere classified: Secondary | ICD-10-CM | POA: Diagnosis not present

## 2022-06-30 DIAGNOSIS — R632 Polyphagia: Secondary | ICD-10-CM | POA: Diagnosis not present

## 2022-06-30 DIAGNOSIS — M0800 Unspecified juvenile rheumatoid arthritis of unspecified site: Secondary | ICD-10-CM | POA: Diagnosis not present

## 2022-07-02 DIAGNOSIS — Z96652 Presence of left artificial knee joint: Secondary | ICD-10-CM | POA: Diagnosis not present

## 2022-07-02 DIAGNOSIS — M25662 Stiffness of left knee, not elsewhere classified: Secondary | ICD-10-CM | POA: Diagnosis not present

## 2022-07-05 DIAGNOSIS — M25662 Stiffness of left knee, not elsewhere classified: Secondary | ICD-10-CM | POA: Diagnosis not present

## 2022-07-05 DIAGNOSIS — Z96652 Presence of left artificial knee joint: Secondary | ICD-10-CM | POA: Diagnosis not present

## 2022-07-12 DIAGNOSIS — M25662 Stiffness of left knee, not elsewhere classified: Secondary | ICD-10-CM | POA: Diagnosis not present

## 2022-07-12 DIAGNOSIS — Z96652 Presence of left artificial knee joint: Secondary | ICD-10-CM | POA: Diagnosis not present

## 2022-07-14 DIAGNOSIS — M25662 Stiffness of left knee, not elsewhere classified: Secondary | ICD-10-CM | POA: Diagnosis not present

## 2022-07-14 DIAGNOSIS — Z96652 Presence of left artificial knee joint: Secondary | ICD-10-CM | POA: Diagnosis not present

## 2022-07-19 DIAGNOSIS — Z96652 Presence of left artificial knee joint: Secondary | ICD-10-CM | POA: Diagnosis not present

## 2022-07-19 DIAGNOSIS — M25662 Stiffness of left knee, not elsewhere classified: Secondary | ICD-10-CM | POA: Diagnosis not present

## 2022-07-22 DIAGNOSIS — M25662 Stiffness of left knee, not elsewhere classified: Secondary | ICD-10-CM | POA: Diagnosis not present

## 2022-07-22 DIAGNOSIS — Z96652 Presence of left artificial knee joint: Secondary | ICD-10-CM | POA: Diagnosis not present

## 2022-07-26 DIAGNOSIS — Z96652 Presence of left artificial knee joint: Secondary | ICD-10-CM | POA: Diagnosis not present

## 2022-07-26 DIAGNOSIS — M25662 Stiffness of left knee, not elsewhere classified: Secondary | ICD-10-CM | POA: Diagnosis not present

## 2022-08-05 DIAGNOSIS — M25662 Stiffness of left knee, not elsewhere classified: Secondary | ICD-10-CM | POA: Diagnosis not present

## 2022-08-05 DIAGNOSIS — Z96652 Presence of left artificial knee joint: Secondary | ICD-10-CM | POA: Diagnosis not present

## 2022-09-09 ENCOUNTER — Ambulatory Visit (INDEPENDENT_AMBULATORY_CARE_PROVIDER_SITE_OTHER): Payer: Medicare Other

## 2022-09-09 ENCOUNTER — Encounter: Payer: Self-pay | Admitting: Nurse Practitioner

## 2022-09-09 ENCOUNTER — Ambulatory Visit: Payer: Medicare Other | Admitting: Nurse Practitioner

## 2022-09-09 VITALS — BP 118/82 | HR 104 | Temp 98.4°F | Ht 59.0 in | Wt 139.6 lb

## 2022-09-09 DIAGNOSIS — J4541 Moderate persistent asthma with (acute) exacerbation: Secondary | ICD-10-CM | POA: Diagnosis not present

## 2022-09-09 DIAGNOSIS — R058 Other specified cough: Secondary | ICD-10-CM

## 2022-09-09 DIAGNOSIS — R059 Cough, unspecified: Secondary | ICD-10-CM | POA: Diagnosis not present

## 2022-09-09 LAB — POCT EXHALED NITRIC OXIDE: FeNO level (ppb): 9

## 2022-09-09 MED ORDER — ALBUTEROL SULFATE HFA 108 (90 BASE) MCG/ACT IN AERS
2.0000 | INHALATION_SPRAY | Freq: Four times a day (QID) | RESPIRATORY_TRACT | 2 refills | Status: DC | PRN
Start: 2022-09-09 — End: 2023-11-01

## 2022-09-09 MED ORDER — AZITHROMYCIN 250 MG PO TABS: ORAL_TABLET | ORAL | 0 refills | Status: DC

## 2022-09-09 MED ORDER — BENZONATATE 200 MG PO CAPS: 200.0000 mg | ORAL_CAPSULE | Freq: Three times a day (TID) | ORAL | 1 refills | Status: DC | PRN

## 2022-09-09 MED ORDER — PROMETHAZINE-DM 6.25-15 MG/5ML PO SYRP: 5.0000 mL | ORAL_SOLUTION | Freq: Four times a day (QID) | ORAL | 0 refills | Status: DC | PRN

## 2022-09-09 NOTE — Assessment & Plan Note (Signed)
Possible asthmatic bronchitis vs postviral cough. She would prefer to not take oral steroids as she has had trouble with them in the past. FeNO was normal today so I agree that it would be ok to hold off for now. We will treat her with empiric azithromycin to cover bacterial coinfection given lack of improvement with supportive care. CXR to rule out superimposed infection.  Target cough control measures. Continue ICS/LABA therapy. Provided with albuterol for rescue. Asthma action plan in place.  Patient Instructions  Continue Dulera 2 puffs Twice daily. Brush tongue and rinse mouth afterwards Continue Albuterol inhaler 2 puffs every 6 hours as needed for shortness of breath or wheezing. Notify if symptoms persist despite rescue inhaler/neb use.  Continue nexium 1 tab daily   -Azithromycin 2 tabs on day one then 1 tab daily for four additional days. Take with food -Guaifenesin [phone] mg Twice daily for congestion -Promethazine DM cough syrup 5 mL every 6 hours as needed for cough. Do not drive after taking. May cause drowsiness -Benzonatate 1 capsule Three times a day for cough - take consistently for next few days -Chlorpheniramine 4 mg tablet over the counter At bedtime for cough/drainage  Chest x ray today  Follow up in 2 weeks with Dr. Melvyn Novas or Katie Saori Umholtz,NP. If symptoms do not improve or worsen, please contact office for sooner follow up or seek emergency care.

## 2022-09-09 NOTE — Progress Notes (Signed)
CXR was clear

## 2022-09-09 NOTE — Patient Instructions (Addendum)
Continue Dulera 2 puffs Twice daily. Brush tongue and rinse mouth afterwards Continue Albuterol inhaler 2 puffs every 6 hours as needed for shortness of breath or wheezing. Notify if symptoms persist despite rescue inhaler/neb use.  Continue nexium 1 tab daily   -Azithromycin 2 tabs on day one then 1 tab daily for four additional days. Take with food -Guaifenesin [phone] mg Twice daily for congestion -Promethazine DM cough syrup 5 mL every 6 hours as needed for cough. Do not drive after taking. May cause drowsiness -Benzonatate 1 capsule Three times a day for cough - take consistently for next few days -Chlorpheniramine 4 mg tablet over the counter At bedtime for cough/drainage  Chest x ray today  Follow up in 2 weeks with Dr. Melvyn Novas or Katie Ezel Vallone,NP. If symptoms do not improve or worsen, please contact office for sooner follow up or seek emergency care.

## 2022-09-09 NOTE — Assessment & Plan Note (Signed)
See above

## 2022-09-09 NOTE — Progress Notes (Signed)
$'@Patient'w$  ID: Kayla [name], female    DOB: November 21, 1965, 56 y.o.   MRN: [id number]  No chief complaint on file.   Referring provider: Lavone Orn, MD  HPI: 56 year old female, never smoker followed for asthma. She is a patient of Dr. Gustavus Bryant and last seen in office 02/22/2022. Past medical history significant for rheumatoid arthritis, GERD, pituitary microadenoma, Paget's disease, anxiety.   TEST/EVENTS:  09/13/2018 PFT: FVC 63, FEV1 37, 48, TLC 112, DLCOunc 112  11/01/2021 eos 400  02/22/2022: OV with Dr. Melvyn Novas. Stable on Dulera. Follow up 12 months or PRN  09/09/2022: Today - acute Patient presents today for acute visit. She developed a cough sometime around Thanksgiving. Has been around numerous people who have been coughing. Did not perform any viral testing. She denies any URI symptoms. Her cough is mostly dry but occasional she gets up tiny amount of yellow sputum. She has been wheezing more and her breathing feels a little more short winded compared to her baseline. She denies fevers, chills, hemoptysis, orthopnea, leg swelling. Eating and drinking well. She is using her [facility] as prescribed. Does not have an albuterol inhaler. She did have an episode with coughing fit and a lot of wheezing yesterday, which as brought on by a friend's perfume. This improved once she got home.   Allergies  Allergen Reactions   Prednisone Other (See Comments)    SEVERE PANIC ATTACK   Methotrexate Other (See Comments)   Other Other (See Comments)   Tramadol Nausea Only   Oxycodone Hcl Rash   Sulfa Antibiotics Rash    Immunization History  Administered Date(s) Administered   19-influenza Whole 07/04/2018   Influenza Split 06/30/2010, 09/07/2011, 08/04/2014, 07/05/2015, 06/02/2016, 07/11/2020   Influenza Whole 07/05/2011   Influenza,inj,Quad PF,6+ Mos 06/02/2016, 08/22/2019   Influenza-Unspecified 09/06/2011, 07/04/2014, 08/19/2015, 07/04/2016, 07/04/2017, 07/04/2018, 09/04/2019   PFIZER(Purple  Top)SARS-COV-2 Vaccination 10/24/2019, 11/14/2019   Pneumococcal Polysaccharide-23 10/05/2007, 08/22/2019   Tdap 09/08/2011    Past Medical History:  Diagnosis Date   Anxiety    Arthritis    Asthma    daily inhaler   Back pain    Carpal tunnel syndrome, right upper limb    Dental crowns present    Depression    GERD (gastroesophageal reflux disease)    Glaucoma    History of kidney stones    Hyperlipidemia    Joint pain    Limited joint range of motion    cervical spine and jaw - due to RA   Pituitary adenoma (HCC)    Rheumatoid arthritis(714.0)    Scalp cyst 10/2016   Sciatica    SOB (shortness of breath)    Swallowing difficulty    Vitamin D deficiency     Tobacco History: Social History   Tobacco Use  Smoking Status Never  Smokeless Tobacco Never   Counseling given: Not Answered   Outpatient Medications Prior to Visit  Medication Sig Dispense Refill   alendronate (FOSAMAX) 70 MG tablet Take 70 mg by mouth once a week.     Baclofen 5 MG TABS Take 5 mg by mouth 2 (two) times daily. 30 tablet 0   brimonidine (ALPHAGAN) 0.2 % ophthalmic solution      celecoxib (CELEBREX) 100 MG capsule Take 100 mg by mouth daily. Take 1 tablet every other day     dorzolamide-timolol (COSOPT) 22.3-6.8 MG/ML ophthalmic solution      esomeprazole (NEXIUM) 40 MG capsule Take 40 mg by mouth daily as needed.  HYDROcodone-acetaminophen (NORCO/VICODIN) 5-325 MG tablet As needed     latanoprost (XALATAN) 0.005 % ophthalmic solution SMARTSIG:In Eye(s)     mometasone-formoterol (DULERA) 200-5 MCG/ACT AERO INHALE TWO PUFFS BY MOUTH TWICE A DAY: EVERY MORNING AND 12 HOURS AFTERWARDS 13 g 11   ORENCIA 125 MG/ML SOSY      phentermine (ADIPEX-P) 37.5 MG tablet 1/2 to 1 tablet daily. 90 tablet 0   venlafaxine XR (EFFEXOR-XR) 37.5 MG 24 hr capsule Take 37.5 mg by mouth daily.     No facility-administered medications prior to visit.     Review of Systems:   Constitutional: No weight loss  or gain, night sweats, fevers, chills, fatigue, or lassitude. HEENT: No headaches, difficulty swallowing, tooth/dental problems, or sore throat. No sneezing, itching, ear ache, nasal congestion, or post nasal drip CV:  No chest pain, orthopnea, PND, swelling in lower extremities, anasarca, dizziness, palpitations, syncope Resp: +shortness of breath with exertion; cough. No hemoptysis. No wheezing.  No chest wall deformity GI:  No heartburn, indigestion, abdominal pain, nausea, vomiting, diarrhea GU: No dysuria, change in color of urine, urgency or frequency.   Skin: No rash, lesions, ulcerations MSK:  No joint pain or swelling.  No decreased range of motion.  No back pain. Neuro: No dizziness or lightheadedness.  Psych: No depression or anxiety. Mood stable.     Physical Exam:  BP 118/82 (BP Location: Right Arm, Patient Position: Sitting, Cuff Size: Normal)   Pulse (!) 104   Temp 98.4 F (36.9 C) (Oral)   Ht '4\' 11"'$  (1.499 m)   Wt 139 lb 9.6 oz (63.3 kg)   SpO2 96%   BMI 28.20 kg/m   GEN: Pleasant, interactive, well-appearing; in no acute distress HEENT:  Normocephalic and atraumatic. PERRLA. Sclera white. Nasal turbinates pink, moist and patent bilaterally. No rhinorrhea present. Oropharynx pink and moist, without exudate or edema. No lesions, ulcerations, or postnasal drip.  NECK:  Supple w/ fair ROM. No JVD present. Normal carotid impulses w/o bruits. Thyroid symmetrical with no goiter or nodules palpated. No lymphadenopathy.   CV: RRR, no m/r/g, no peripheral edema. Pulses intact, +2 bilaterally. No cyanosis, pallor or clubbing. PULMONARY:  Unlabored, regular breathing. Clear bilaterally A&P w/o wheezes/rales/rhonchi. Bronchitic cough. No accessory muscle use.  GI: BS present and normoactive. Soft, non-tender to palpation. No organomegaly or masses detected.  MSK: No erythema, warmth or tenderness. Cap refil <2 sec all extrem. No deformities or joint swelling noted.  Neuro: A/Ox3.  No focal deficits noted.   Skin: Warm, no lesions or rashe Psych: Normal affect and behavior. Judgement and thought content appropriate.     Lab Results:  CBC    Component Value Date/Time   WBC 13.3 (H) 11/01/2021 1835   RBC 4.39 11/01/2021 1835   HGB 13.9 11/01/2021 1835   HCT 42.4 11/01/2021 1835   PLT 333 11/01/2021 1835   MCV 96.6 11/01/2021 1835   MCH 31.7 11/01/2021 1835   MCHC 32.8 11/01/2021 1835   RDW 12.2 11/01/2021 1835   LYMPHSABS 4.1 (H) 11/01/2021 1835   MONOABS 1.2 (H) 11/01/2021 1835   EOSABS 0.4 11/01/2021 1835   BASOSABS 0.1 11/01/2021 1835    BMET    Component Value Date/Time   NA 139 11/01/2021 1835   NA 143 11/25/2020 0000   K 3.9 11/01/2021 1835   CL 102 11/01/2021 1835   CO2 29 11/01/2021 1835   GLUCOSE 124 (H) 11/01/2021 1835   BUN 27 (H) 11/01/2021 1835   BUN 24 (A)  11/25/2020 0000   CREATININE 0.69 11/01/2021 1835   CALCIUM 9.7 11/01/2021 1835   GFRNONAA >60 11/01/2021 1835    BNP No results found for: "BNP"   Imaging:  DG Chest 2 View  Result Date: 09/09/2022 CLINICAL DATA:  Cough. EXAM: CHEST - 2 VIEW COMPARISON:  January 12, 2016. FINDINGS: The heart size and mediastinal contours are within normal limits. Both lungs are clear. The visualized skeletal structures are unremarkable. IMPRESSION: No active cardiopulmonary disease. Electronically Signed   By: Marijo Conception M.D.   On: 09/09/2022 13:22         Latest Ref Rng & Units 09/13/2018   11:40 AM 07/13/2017   11:43 AM 05/30/2015   10:29 AM  PFT Results  FVC-Pre L 1.83  1.81  1.80   FVC-Predicted Pre % 63  62  60   FVC-Post L 1.84  1.92  1.86   FVC-Predicted Post % 63  65  62   Pre FEV1/FVC % % 47  43  47   Post FEV1/FCV % % 48  44  48   FEV1-Pre L 0.85  0.78  0.85   FEV1-Predicted Pre % 37  33  35   FEV1-Post L 0.88  0.85  0.90   DLCO uncorrected ml/min/mmHg 19.80  20.14  22.33   DLCO UNC% % 112  114  127   DLCO corrected ml/min/mmHg  19.96    DLCO COR %Predicted %   113    DLVA Predicted % 145  148  164   TLC L 4.83  4.71    TLC % Predicted % 112  109    RV % Predicted % 170  175      Lab Results  Component Value Date   NITRICOXIDE 11 05/01/2018        Assessment & Plan:   Asthmatic bronchitis with exacerbation Possible asthmatic bronchitis vs postviral cough. She would prefer to not take oral steroids as she has had trouble with them in the past. FeNO was normal today so I agree that it would be ok to hold off for now. We will treat her with empiric azithromycin to cover bacterial coinfection given lack of improvement with supportive care. CXR to rule out superimposed infection.  Target cough control measures. Continue ICS/LABA therapy. Provided with albuterol for rescue. Asthma action plan in place.  Patient Instructions  Continue Dulera 2 puffs Twice daily. Brush tongue and rinse mouth afterwards Continue Albuterol inhaler 2 puffs every 6 hours as needed for shortness of breath or wheezing. Notify if symptoms persist despite rescue inhaler/neb use.  Continue nexium 1 tab daily   -Azithromycin 2 tabs on day one then 1 tab daily for four additional days. Take with food -Guaifenesin [phone] mg Twice daily for congestion -Promethazine DM cough syrup 5 mL every 6 hours as needed for cough. Do not drive after taking. May cause drowsiness -Benzonatate 1 capsule Three times a day for cough - take consistently for next few days -Chlorpheniramine 4 mg tablet over the counter At bedtime for cough/drainage  Chest x ray today  Follow up in 2 weeks with Dr. Melvyn Novas or Katie Saidah Kempton,NP. If symptoms do not improve or worsen, please contact office for sooner follow up or seek emergency care.    Post-viral cough syndrome See above.  I spent 35 minutes of dedicated to the care of this patient on the date of this encounter to include pre-visit review of records, face-to-face time with the patient discussing conditions above,  post visit ordering of testing,  clinical documentation with the electronic health record, making appropriate referrals as documented, and communicating necessary findings to members of the patients care team.  Clayton Bibles, NP 09/09/2022  Pt aware and understands NP's role.

## 2022-09-23 ENCOUNTER — Ambulatory Visit: Payer: Medicare Other | Admitting: Nurse Practitioner

## 2022-11-17 ENCOUNTER — Other Ambulatory Visit: Payer: Self-pay | Admitting: Internal Medicine

## 2022-11-17 DIAGNOSIS — Z1231 Encounter for screening mammogram for malignant neoplasm of breast: Secondary | ICD-10-CM

## 2022-12-23 DIAGNOSIS — J4530 Mild persistent asthma, uncomplicated: Secondary | ICD-10-CM | POA: Diagnosis not present

## 2022-12-23 DIAGNOSIS — Z0000 Encounter for general adult medical examination without abnormal findings: Secondary | ICD-10-CM | POA: Diagnosis not present

## 2022-12-23 DIAGNOSIS — M810 Age-related osteoporosis without current pathological fracture: Secondary | ICD-10-CM | POA: Diagnosis not present

## 2022-12-23 DIAGNOSIS — M069 Rheumatoid arthritis, unspecified: Secondary | ICD-10-CM | POA: Diagnosis not present

## 2022-12-23 DIAGNOSIS — E7800 Pure hypercholesterolemia, unspecified: Secondary | ICD-10-CM | POA: Diagnosis not present

## 2022-12-23 DIAGNOSIS — D352 Benign neoplasm of pituitary gland: Secondary | ICD-10-CM | POA: Diagnosis not present

## 2022-12-23 DIAGNOSIS — Z86010 Personal history of colonic polyps: Secondary | ICD-10-CM | POA: Diagnosis not present

## 2022-12-23 DIAGNOSIS — E559 Vitamin D deficiency, unspecified: Secondary | ICD-10-CM | POA: Diagnosis not present

## 2022-12-23 DIAGNOSIS — K219 Gastro-esophageal reflux disease without esophagitis: Secondary | ICD-10-CM | POA: Diagnosis not present

## 2022-12-23 DIAGNOSIS — R0683 Snoring: Secondary | ICD-10-CM | POA: Diagnosis not present

## 2022-12-24 DIAGNOSIS — M069 Rheumatoid arthritis, unspecified: Secondary | ICD-10-CM | POA: Diagnosis not present

## 2022-12-24 DIAGNOSIS — M19011 Primary osteoarthritis, right shoulder: Secondary | ICD-10-CM | POA: Diagnosis not present

## 2022-12-24 DIAGNOSIS — M85812 Other specified disorders of bone density and structure, left shoulder: Secondary | ICD-10-CM | POA: Diagnosis not present

## 2022-12-24 DIAGNOSIS — Z7960 Long term (current) use of unspecified immunomodulators and immunosuppressants: Secondary | ICD-10-CM | POA: Diagnosis not present

## 2022-12-24 DIAGNOSIS — M0800 Unspecified juvenile rheumatoid arthritis of unspecified site: Secondary | ICD-10-CM | POA: Diagnosis not present

## 2022-12-28 DIAGNOSIS — E559 Vitamin D deficiency, unspecified: Secondary | ICD-10-CM | POA: Diagnosis not present

## 2022-12-28 DIAGNOSIS — R632 Polyphagia: Secondary | ICD-10-CM | POA: Diagnosis not present

## 2022-12-28 DIAGNOSIS — Z8639 Personal history of other endocrine, nutritional and metabolic disease: Secondary | ICD-10-CM | POA: Diagnosis not present

## 2022-12-28 DIAGNOSIS — E7800 Pure hypercholesterolemia, unspecified: Secondary | ICD-10-CM | POA: Diagnosis not present

## 2023-01-06 ENCOUNTER — Ambulatory Visit
Admission: RE | Admit: 2023-01-06 | Discharge: 2023-01-06 | Disposition: A | Discharge status: Home / Self Care | Payer: Medicare Other | Source: Ambulatory Visit | Attending: Internal Medicine | Admitting: Internal Medicine

## 2023-01-06 DIAGNOSIS — Z1231 Encounter for screening mammogram for malignant neoplasm of breast: Secondary | ICD-10-CM

## 2023-01-12 DIAGNOSIS — M47812 Spondylosis without myelopathy or radiculopathy, cervical region: Secondary | ICD-10-CM | POA: Diagnosis not present

## 2023-02-04 DIAGNOSIS — L821 Other seborrheic keratosis: Secondary | ICD-10-CM | POA: Diagnosis not present

## 2023-02-04 DIAGNOSIS — L578 Other skin changes due to chronic exposure to nonionizing radiation: Secondary | ICD-10-CM | POA: Diagnosis not present

## 2023-02-04 DIAGNOSIS — L814 Other melanin hyperpigmentation: Secondary | ICD-10-CM | POA: Diagnosis not present

## 2023-02-04 DIAGNOSIS — W57XXXA Bitten or stung by nonvenomous insect and other nonvenomous arthropods, initial encounter: Secondary | ICD-10-CM | POA: Diagnosis not present

## 2023-02-04 DIAGNOSIS — L738 Other specified follicular disorders: Secondary | ICD-10-CM | POA: Diagnosis not present

## 2023-02-04 DIAGNOSIS — D2272 Melanocytic nevi of left lower limb, including hip: Secondary | ICD-10-CM | POA: Diagnosis not present

## 2023-02-04 DIAGNOSIS — Z86018 Personal history of other benign neoplasm: Secondary | ICD-10-CM | POA: Diagnosis not present

## 2023-02-04 DIAGNOSIS — D485 Neoplasm of uncertain behavior of skin: Secondary | ICD-10-CM | POA: Diagnosis not present

## 2023-02-04 DIAGNOSIS — D2261 Melanocytic nevi of right upper limb, including shoulder: Secondary | ICD-10-CM | POA: Diagnosis not present

## 2023-02-04 DIAGNOSIS — D239 Other benign neoplasm of skin, unspecified: Secondary | ICD-10-CM | POA: Diagnosis not present

## 2023-02-04 DIAGNOSIS — D225 Melanocytic nevi of trunk: Secondary | ICD-10-CM | POA: Diagnosis not present

## 2023-02-04 DIAGNOSIS — D2271 Melanocytic nevi of right lower limb, including hip: Secondary | ICD-10-CM | POA: Diagnosis not present

## 2023-03-03 DIAGNOSIS — E559 Vitamin D deficiency, unspecified: Secondary | ICD-10-CM | POA: Diagnosis not present

## 2023-03-03 DIAGNOSIS — R632 Polyphagia: Secondary | ICD-10-CM | POA: Diagnosis not present

## 2023-03-03 DIAGNOSIS — E7800 Pure hypercholesterolemia, unspecified: Secondary | ICD-10-CM | POA: Diagnosis not present

## 2023-03-07 DIAGNOSIS — H4041X1 Glaucoma secondary to eye inflammation, right eye, mild stage: Secondary | ICD-10-CM | POA: Diagnosis not present

## 2023-03-07 DIAGNOSIS — H4042X3 Glaucoma secondary to eye inflammation, left eye, severe stage: Secondary | ICD-10-CM | POA: Diagnosis not present

## 2023-03-16 ENCOUNTER — Ambulatory Visit (INDEPENDENT_AMBULATORY_CARE_PROVIDER_SITE_OTHER): Payer: Medicare Other

## 2023-03-16 ENCOUNTER — Ambulatory Visit: Payer: Medicare Other | Admitting: Nurse Practitioner

## 2023-03-16 ENCOUNTER — Encounter: Payer: Self-pay | Admitting: Nurse Practitioner

## 2023-03-16 VITALS — BP 120/80 | HR 98 | Ht <= 58 in | Wt 144.6 lb

## 2023-03-16 DIAGNOSIS — R0609 Other forms of dyspnea: Secondary | ICD-10-CM | POA: Diagnosis not present

## 2023-03-16 DIAGNOSIS — R0600 Dyspnea, unspecified: Secondary | ICD-10-CM | POA: Diagnosis not present

## 2023-03-16 DIAGNOSIS — J4541 Moderate persistent asthma with (acute) exacerbation: Secondary | ICD-10-CM

## 2023-03-16 DIAGNOSIS — R0683 Snoring: Secondary | ICD-10-CM | POA: Diagnosis not present

## 2023-03-16 MED ORDER — SPIRIVA RESPIMAT 1.25 MCG/ACT IN AERS: 2.0000 | INHALATION_SPRAY | Freq: Every day | RESPIRATORY_TRACT | 5 refills | Status: DC

## 2023-03-16 MED ORDER — MONTELUKAST SODIUM 10 MG PO TABS: 10.0000 mg | ORAL_TABLET | Freq: Every day | ORAL | 5 refills | Status: DC

## 2023-03-16 NOTE — Assessment & Plan Note (Addendum)
She has loud snoring, excessive daytime fatigue, restless sleep. Concern for underlying sleep apnea. Recommended home sleep study but she declined at this time. We discussed how untreated sleep apnea puts an individual at risk for cardiac arrhthymias, pulm HTN, DM, stroke and increases their risk for daytime accidents. She will consider if symptoms do not improve. Cautioned on safe driving practices

## 2023-03-16 NOTE — Assessment & Plan Note (Signed)
See above plan. 

## 2023-03-16 NOTE — Progress Notes (Signed)
@Patient  ID: Kayla [name], female    DOB: Apr 12, 1966, 57 y.o.   MRN: [id number]  Chief Complaint  Patient presents with   Acute Visit    Pt has been having trouble with breathing, has been using her inhaler. Pt has been using THC 5mg   gummies and wants to know if this could be causing her shortness of breath.     Referring provider: Thana Ates, MD  HPI: 57 year old female, never smoker followed for asthma. She is a patient of Dr. Thurston Hole and last seen in office 09/09/2022 by [facility] NP. Past medical history significant for rheumatoid arthritis, GERD, pituitary microadenoma, Paget's disease, anxiety.   TEST/EVENTS:  09/13/2018 PFT: FVC 63, FEV1 37, 48, TLC 112, DLCOunc 112  11/01/2021 eos 400  02/22/2022: OV with Dr. Sherene Sires. Stable on Dulera. Follow up 12 months or PRN  09/09/2022: OV with Cailin Gebel NP for acute visit. She developed a cough sometime around Thanksgiving. Has been around numerous people who have been coughing. Did not perform any viral testing. She denies any URI symptoms. Her cough is mostly dry but occasional she gets up tiny amount of yellow sputum. She has been wheezing more and her breathing feels a little more short winded compared to her baseline. She denies fevers, chills, hemoptysis, orthopnea, leg swelling. Eating and drinking well. She is using her [facility] as prescribed. Does not have an albuterol inhaler. She did have an episode with coughing fit and a lot of wheezing yesterday, which as brought on by a friend's perfume. This improved once she got home.   03/16/2023: Today - acute Patient presents today for acute visit. She has been having more trouble with shortness of breath recently. Noticed it more since the weather warmed up/over the past few months.  Tends to be worse in the mornings.  She has to use her rescue inhaler, which she was not having to do previously.  She has a rare cough, with minimal sputum production that is clear.  Does not seem to be any worse than her  normal.  Not having any chest tightness, wheezing, lower extremity swelling, orthopnea, palpitations.  She is feeling a bit more fatigued.  She does have very loud snoring at night.  She has been told that she stops breathing when she sleeps at night by friend.  She is not sure if this is anything to do with her current symptoms.  She does not want to have a home sleep study right now.  Will consider it at a later date. She has been taking low dose THC edibles to help manage her chronic pain. She does not smoke marijuana. Hasn't noticed a correlation between the edibles and her dyspnea but wasn't sure this was playing a role.   Allergies  Allergen Reactions   Prednisone Other (See Comments)    SEVERE PANIC ATTACK   Methotrexate Other (See Comments)   Other Other (See Comments)   Tramadol Nausea Only   Oxycodone Hcl Rash   Sulfa Antibiotics Rash    Immunization History  Administered Date(s) Administered   19-influenza Whole 07/04/2018   Influenza Split 06/30/2010, 09/07/2011, 08/04/2014, 07/05/2015, 06/02/2016, 07/11/2020   Influenza Whole 07/05/2011   Influenza,inj,Quad PF,6+ Mos 06/02/2016, 08/22/2019   Influenza-Unspecified 09/06/2011, 07/04/2014, 08/19/2015, 07/04/2016, 07/04/2017, 07/04/2018, 09/04/2019   PFIZER(Purple Top)SARS-COV-2 Vaccination 10/24/2019, 11/14/2019   Pneumococcal Polysaccharide-23 10/05/2007, 08/22/2019   Tdap 09/08/2011    Past Medical History:  Diagnosis Date   Anxiety    Arthritis  Asthma    daily inhaler   Back pain    Carpal tunnel syndrome, right upper limb    Dental crowns present    Depression    GERD (gastroesophageal reflux disease)    Glaucoma    History of kidney stones    Hyperlipidemia    Joint pain    Limited joint range of motion    cervical spine and jaw - due to RA   Pituitary adenoma (HCC)    Rheumatoid arthritis(714.0)    Scalp cyst 10/2016   Sciatica    SOB (shortness of breath)    Swallowing difficulty    Vitamin D  deficiency     Tobacco History: Social History   Tobacco Use  Smoking Status Never  Smokeless Tobacco Never   Counseling given: Not Answered   Outpatient Medications Prior to Visit  Medication Sig Dispense Refill   albuterol (VENTOLIN HFA) 108 (90 Base) MCG/ACT inhaler Inhale 2 puffs into the lungs every 6 (six) hours as needed for wheezing or shortness of breath. 8 g 2   alendronate (FOSAMAX) 70 MG tablet Take 70 mg by mouth once a week.     azithromycin (ZITHROMAX) 250 MG tablet Take 2 tablets on day one then 1 tablet daily for four additional days 6 tablet 0   Baclofen 5 MG TABS Take 5 mg by mouth 2 (two) times daily. 30 tablet 0   benzonatate (TESSALON) 200 MG capsule Take 1 capsule (200 mg total) by mouth 3 (three) times daily as needed for cough. 30 capsule 1   brimonidine (ALPHAGAN) 0.2 % ophthalmic solution      celecoxib (CELEBREX) 100 MG capsule Take 100 mg by mouth daily. Take 1 tablet every other day     dorzolamide-timolol (COSOPT) 22.3-6.8 MG/ML ophthalmic solution      esomeprazole (NEXIUM) 40 MG capsule Take 40 mg by mouth daily as needed.     HYDROcodone-acetaminophen (NORCO/VICODIN) 5-325 MG tablet As needed     latanoprost (XALATAN) 0.005 % ophthalmic solution SMARTSIG:In Eye(s)     mometasone-formoterol (DULERA) 200-5 MCG/ACT AERO INHALE TWO PUFFS BY MOUTH TWICE A DAY: EVERY MORNING AND 12 HOURS AFTERWARDS 13 g 11   ORENCIA 125 MG/ML SOSY      phentermine (ADIPEX-P) 37.5 MG tablet 1/2 to 1 tablet daily. 90 tablet 0   promethazine-dextromethorphan (PROMETHAZINE-DM) 6.25-15 MG/5ML syrup Take 5 mLs by mouth every 6 (six) hours as needed for cough. 118 mL 0   venlafaxine XR (EFFEXOR-XR) 37.5 MG 24 hr capsule Take 37.5 mg by mouth daily.     No facility-administered medications prior to visit.     Review of Systems:   Constitutional: No weight loss or gain, night sweats, fevers, chills, or lassitude. +fatigue  HEENT: No headaches, difficulty swallowing,  tooth/dental problems, or sore throat. No sneezing, itching, ear ache, nasal congestion, or post nasal drip CV:  No chest pain, orthopnea, PND, swelling in lower extremities, anasarca, dizziness, palpitations, syncope Resp: +shortness of breath with exertion; rare cough. No hemoptysis. No wheezing.  No chest wall deformity GI:  No heartburn, indigestion, abdominal pain, nausea, vomiting, diarrhea GU: No dysuria, change in color of urine, urgency or frequency.   Skin: No rash, lesions, ulcerations MSK:  No joint pain or swelling.   Neuro: No dizziness or lightheadedness.  Psych: No depression or anxiety. Mood stable.     Physical Exam:  BP 120/80   Pulse 98   Ht 4\' 10"  (1.473 m)   Wt 144 lb 9.6  oz (65.6 kg)   SpO2 97%   BMI 30.22 kg/m   GEN: Pleasant, interactive, well-appearing; in no acute distress HEENT:  Normocephalic and atraumatic. PERRLA. Sclera white. Nasal turbinates pink, moist and patent bilaterally. No rhinorrhea present. Oropharynx pink and moist, without exudate or edema. No lesions, ulcerations, or postnasal drip.  NECK:  Supple w/ fair ROM. No JVD present. Normal carotid impulses w/o bruits. Thyroid symmetrical with no goiter or nodules palpated. No lymphadenopathy.   CV: RRR, no m/r/g, no peripheral edema. Pulses intact, +2 bilaterally. No cyanosis, pallor or clubbing. PULMONARY:  Unlabored, regular breathing. Clear bilaterally A&P w/o wheezes/rales/rhonchi. No accessory muscle use.  GI: BS present and normoactive. Soft, non-tender to palpation. No organomegaly or masses detected.  MSK: No erythema, warmth or tenderness. Cap refil <2 sec all extrem. No deformities or joint swelling noted.  Neuro: A/Ox3. No focal deficits noted.   Skin: Warm, no lesions or rashe Psych: Normal affect and behavior. Judgement and thought content appropriate.     Lab Results:  CBC    Component Value Date/Time   WBC 13.3 (H) 11/01/2021 1835   RBC 4.39 11/01/2021 1835   HGB 13.9  11/01/2021 1835   HCT 42.4 11/01/2021 1835   PLT 333 11/01/2021 1835   MCV 96.6 11/01/2021 1835   MCH 31.7 11/01/2021 1835   MCHC 32.8 11/01/2021 1835   RDW 12.2 11/01/2021 1835   LYMPHSABS 4.1 (H) 11/01/2021 1835   MONOABS 1.2 (H) 11/01/2021 1835   EOSABS 0.4 11/01/2021 1835   BASOSABS 0.1 11/01/2021 1835    BMET    Component Value Date/Time   NA 139 11/01/2021 1835   NA 143 11/25/2020 0000   K 3.9 11/01/2021 1835   CL 102 11/01/2021 1835   CO2 29 11/01/2021 1835   GLUCOSE 124 (H) 11/01/2021 1835   BUN 27 (H) 11/01/2021 1835   BUN 24 (A) 11/25/2020 0000   CREATININE 0.69 11/01/2021 1835   CALCIUM 9.7 11/01/2021 1835   GFRNONAA >60 11/01/2021 1835    BNP No results found for: "BNP"   Imaging:  DG Chest 2 View  Result Date: 03/16/2023 CLINICAL DATA:  Dyspnea EXAM: CHEST - 2 VIEW COMPARISON:  07/10/2022 FINDINGS: Cardiac and mediastinal contours are within normal limits. No focal pulmonary opacity. No pleural effusion or pneumothorax. No acute osseous abnormality. IMPRESSION: No acute cardiopulmonary process. Electronically Signed   By: Wiliam Ke M.D.   On: 03/16/2023 16:32         Latest Ref Rng & Units 09/13/2018   11:40 AM 07/13/2017   11:43 AM 05/30/2015   10:29 AM  PFT Results  FVC-Pre L 1.83  1.81  1.80   FVC-Predicted Pre % 63  62  60   FVC-Post L 1.84  1.92  1.86   FVC-Predicted Post % 63  65  62   Pre FEV1/FVC % % 47  43  47   Post FEV1/FCV % % 48  44  48   FEV1-Pre L 0.85  0.78  0.85   FEV1-Predicted Pre % 37  33  35   FEV1-Post L 0.88  0.85  0.90   DLCO uncorrected ml/min/mmHg 19.80  20.14  22.33   DLCO UNC% % 112  114  127   DLCO corrected ml/min/mmHg  19.96    DLCO COR %Predicted %  113    DLVA Predicted % 145  148  164   TLC L 4.83  4.71    TLC % Predicted % 112  109    RV % Predicted % 170  175      Lab Results  Component Value Date   NITRICOXIDE 11 05/01/2018        Assessment & Plan:   Moderate asthma with acute  exacerbation Question mild exacerbation with change in weather vs poor control vs alternative underlying etiology. She declined trial of steroids. She does not have bronchospasm on exam today. We will add on LAMA therapy and start her on singulair to help with trigger prevention. Obtain CBC to rule out anemia, BNP to rule out cardiac etiology, and TSH to rule out thyroid disease as contributing factors given her constellation of symptoms. She does not have any hypoxia and Wells with low suspicion for PE. CXR today to rule out acute process. Action plan in place. Unlikely that the [facility] edibles she is using are contributing given dosage and route of administration.   Patient Instructions  Continue Dulera 2 puffs Twice daily. Brush tongue and rinse mouth afterwards Continue Albuterol inhaler 2 puffs every 6 hours as needed for shortness of breath or wheezing. Notify if symptoms persist despite rescue inhaler/neb use.  Continue nexium 1 tab daily   Start spiriva 2 puffs daily with Dulera Start singulair (montelukast) 1 tab At bedtime for asthma  Chest x ray today Labs today   Follow up in 6 weeks with Dr. Sherene Sires or Katie Labrisha Wuellner,NP. If symptoms do not improve or worsen, please contact office for sooner follow up or seek emergency care.    DOE (dyspnea on exertion) See above plan.   Loud snoring She has loud snoring, excessive daytime fatigue, restless sleep. Concern for underlying sleep apnea. Recommended home sleep study but she declined at this time. We discussed how untreated sleep apnea puts an individual at risk for cardiac arrhthymias, pulm HTN, DM, stroke and increases their risk for daytime accidents. She will consider if symptoms do not improve. Cautioned on safe driving practices   I spent 35 minutes of dedicated to the care of this patient on the date of this encounter to include pre-visit review of records, face-to-face time with the patient discussing conditions above, post visit ordering  of testing, clinical documentation with the electronic health record, making appropriate referrals as documented, and communicating necessary findings to members of the patients care team.  Noemi Chapel, NP 03/16/2023  Pt aware and understands NP's role.

## 2023-03-16 NOTE — Patient Instructions (Signed)
Continue Dulera 2 puffs Twice daily. Brush tongue and rinse mouth afterwards Continue Albuterol inhaler 2 puffs every 6 hours as needed for shortness of breath or wheezing. Notify if symptoms persist despite rescue inhaler/neb use.  Continue nexium 1 tab daily   Start spiriva 2 puffs daily with Dulera Start singulair (montelukast) 1 tab At bedtime for asthma  Chest x ray today Labs today   Follow up in 6 weeks with Dr. Sherene Sires or Katie Jamaria Amborn,NP. If symptoms do not improve or worsen, please contact office for sooner follow up or seek emergency care.

## 2023-03-16 NOTE — Assessment & Plan Note (Addendum)
Question mild exacerbation with change in weather vs poor control vs alternative underlying etiology. She declined trial of steroids. She does not have bronchospasm on exam today. We will add on LAMA therapy and start her on singulair to help with trigger prevention. Obtain CBC to rule out anemia, BNP to rule out cardiac etiology, and TSH to rule out thyroid disease as contributing factors given her constellation of symptoms. She does not have any hypoxia and Wells with low suspicion for PE. CXR today to rule out acute process. Action plan in place. Unlikely that the [facility] edibles she is using are contributing given dosage and route of administration.   Patient Instructions  Continue Dulera 2 puffs Twice daily. Brush tongue and rinse mouth afterwards Continue Albuterol inhaler 2 puffs every 6 hours as needed for shortness of breath or wheezing. Notify if symptoms persist despite rescue inhaler/neb use.  Continue nexium 1 tab daily   Start spiriva 2 puffs daily with Dulera Start singulair (montelukast) 1 tab At bedtime for asthma  Chest x ray today Labs today   Follow up in 6 weeks with Dr. Sherene Sires or Katie Corion Sherrod,NP. If symptoms do not improve or worsen, please contact office for sooner follow up or seek emergency care.

## 2023-03-17 LAB — CBC WITH DIFFERENTIAL/PLATELET
Basophils Absolute: 0.1 10*3/uL (ref 0.0–0.1)
Basophils Relative: 0.8 % (ref 0.0–3.0)
Eosinophils Absolute: 0.4 10*3/uL (ref 0.0–0.7)
Eosinophils Relative: 3.7 % (ref 0.0–5.0)
HCT: 41.5 % (ref 36.0–46.0)
Hemoglobin: 13.7 g/dL (ref 12.0–15.0)
Lymphocytes Relative: 25.4 % (ref 12.0–46.0)
Lymphs Abs: 2.6 10*3/uL (ref 0.7–4.0)
MCHC: 32.9 g/dL (ref 30.0–36.0)
MCV: 96.7 fl (ref 78.0–100.0)
Monocytes Absolute: 0.9 10*3/uL (ref 0.1–1.0)
Monocytes Relative: 8.4 % (ref 3.0–12.0)
Neutro Abs: 6.4 10*3/uL (ref 1.4–7.7)
Neutrophils Relative %: 61.7 % (ref 43.0–77.0)
Platelets: 350 10*3/uL (ref 150.0–400.0)
RBC: 4.3 Mil/uL (ref 3.87–5.11)
RDW: 12.6 % (ref 11.5–15.5)
WBC: 10.3 10*3/uL (ref 4.0–10.5)

## 2023-03-17 LAB — BASIC METABOLIC PANEL
BUN: 29 mg/dL — ABNORMAL HIGH (ref 6–23)
CO2: 28 mEq/L (ref 19–32)
Calcium: 9.8 mg/dL (ref 8.4–10.5)
Chloride: 103 mEq/L (ref 96–112)
Creatinine, Ser: 1.44 mg/dL — ABNORMAL HIGH (ref 0.40–1.20)
GFR: 40.37 mL/min — ABNORMAL LOW (ref >60.00)
Glucose, Bld: 98 mg/dL (ref 70–99)
Potassium: 4.3 mEq/L (ref 3.5–5.1)
Sodium: 140 mEq/L (ref 135–145)

## 2023-03-17 LAB — TSH: TSH: 1.78 u[IU]/mL (ref 0.35–5.50)

## 2023-03-17 LAB — BRAIN NATRIURETIC PEPTIDE: Pro B Natriuretic peptide (BNP): 9 pg/mL (ref 0.0–100.0)

## 2023-03-18 NOTE — Progress Notes (Signed)
Please notify patient that she did not have any evidence of anemia, heart failure, or thyroid problems. She does have an elevated peripheral eosinophil count, which can cause some increased inflammation in the airways and more trouble with breathing. The singulair should help with this as it is usually a response to allergies/environmental triggers. We will reassess at follow up.  She does have a decline in her kidney function. I would recommend she follow up with her PCP regarding this to monitor or refer her to a kidney doctor. Thanks!

## 2023-03-31 DIAGNOSIS — E7800 Pure hypercholesterolemia, unspecified: Secondary | ICD-10-CM | POA: Diagnosis not present

## 2023-03-31 DIAGNOSIS — R7309 Other abnormal glucose: Secondary | ICD-10-CM | POA: Diagnosis not present

## 2023-03-31 DIAGNOSIS — R632 Polyphagia: Secondary | ICD-10-CM | POA: Diagnosis not present

## 2023-03-31 DIAGNOSIS — E559 Vitamin D deficiency, unspecified: Secondary | ICD-10-CM | POA: Diagnosis not present

## 2023-04-19 DIAGNOSIS — R3121 Asymptomatic microscopic hematuria: Secondary | ICD-10-CM | POA: Diagnosis not present

## 2023-04-19 DIAGNOSIS — R3129 Other microscopic hematuria: Secondary | ICD-10-CM | POA: Diagnosis not present

## 2023-04-19 DIAGNOSIS — K5730 Diverticulosis of large intestine without perforation or abscess without bleeding: Secondary | ICD-10-CM | POA: Diagnosis not present

## 2023-04-19 DIAGNOSIS — N200 Calculus of kidney: Secondary | ICD-10-CM | POA: Diagnosis not present

## 2023-04-19 DIAGNOSIS — N170 Acute kidney failure with tubular necrosis: Secondary | ICD-10-CM | POA: Diagnosis not present

## 2023-04-27 ENCOUNTER — Ambulatory Visit: Payer: Medicare Other | Admitting: Nurse Practitioner

## 2023-05-16 ENCOUNTER — Ambulatory Visit: Payer: Medicare Other | Admitting: Nurse Practitioner

## 2023-05-16 ENCOUNTER — Encounter: Payer: Self-pay | Admitting: Nurse Practitioner

## 2023-05-16 VITALS — BP 124/72 | HR 88 | Ht <= 58 in | Wt 146.0 lb

## 2023-05-16 DIAGNOSIS — J4541 Moderate persistent asthma with (acute) exacerbation: Secondary | ICD-10-CM

## 2023-05-16 MED ORDER — PROMETHAZINE-DM 6.25-15 MG/5ML PO SYRP
5.0000 mL | ORAL_SOLUTION | Freq: Four times a day (QID) | ORAL | 0 refills | Status: DC | PRN
Start: 2023-05-16 — End: 2023-05-20

## 2023-05-16 MED ORDER — PREDNISONE 10 MG PO TABS
ORAL_TABLET | ORAL | 0 refills | Status: DC
Start: 2023-05-16 — End: 2023-06-20

## 2023-05-16 MED ORDER — DULERA 200-5 MCG/ACT IN AERO
INHALATION_SPRAY | RESPIRATORY_TRACT | 11 refills | Status: DC
Start: 2023-05-16 — End: 2024-06-11

## 2023-05-16 MED ORDER — MONTELUKAST SODIUM 10 MG PO TABS: 10.0000 mg | ORAL_TABLET | Freq: Every day | ORAL | 5 refills | Status: DC

## 2023-05-16 NOTE — Assessment & Plan Note (Signed)
Unresolved exacerbation with worsening of symptoms. Elevated exhaled nitric oxide testing. She was agreeable to steroid course; prefers to stay at a lower dose. Prednisone taper and cough control measures advised. Previous workup with elevated peripheral eosinophils; BNP nl, CXR clear. If symptoms do not improve, will repeat imaging. Advised to start singulair for trigger prevention. Medication side effect profile reviewed. Action plan in place.  Patient Instructions  Continue Dulera 2 puffs Twice daily. Brush tongue and rinse mouth afterwards Continue Albuterol inhaler 2 puffs every 6 hours as needed for shortness of breath or wheezing. Notify if symptoms persist despite rescue inhaler/neb use.  Continue nexium 1 tab daily  Continue spiriva 2 puffs daily with Dulera  Start singulair (montelukast) 1 tab At bedtime for asthma Prednisone 20 mg daily for 5 days then take 10 mg daily for 5 days. Take in AM with food    Follow up in 4-6 weeks with Dr. Sherene Sires or Katie Lory Galan,NP. If symptoms do not improve or worsen, please contact office for sooner follow up or seek emergency care.

## 2023-05-16 NOTE — Patient Instructions (Addendum)
Continue Dulera 2 puffs Twice daily. Brush tongue and rinse mouth afterwards Continue Albuterol inhaler 2 puffs every 6 hours as needed for shortness of breath or wheezing. Notify if symptoms persist despite rescue inhaler/neb use.  Continue nexium 1 tab daily  Continue spiriva 2 puffs daily with Dulera  Start singulair (montelukast) 1 tab At bedtime for asthma Prednisone 20 mg daily for 5 days then take 10 mg daily for 5 days. Take in AM with food    Follow up in 4-6 weeks with Dr. Sherene Sires or Katie Rober Skeels,NP. If symptoms do not improve or worsen, please contact office for sooner follow up or seek emergency care.

## 2023-05-16 NOTE — Progress Notes (Signed)
@Patient  ID: Kayla [name], female    DOB: 1966/01/26, 57 y.o.   MRN: [id number]  Chief Complaint  Patient presents with   Follow-up    Pt states she has been coughing and wheezing. She is also sob. Pt has been using all three of her inhalers.     Referring provider: Thana Ates, MD  HPI: 57 year old female, never smoker followed for asthma. She is a patient of Dr. Thurston Hole and last seen in office 03/16/2023 by [facility] NP. Past medical history significant for rheumatoid arthritis, GERD, pituitary microadenoma, Paget's disease, anxiety.   TEST/EVENTS:  09/13/2018 PFT: FVC 63, FEV1 37, 48, TLC 112, DLCOunc 112  11/01/2021 eos 400  02/22/2022: OV with Dr. Sherene Sires. Stable on Dulera. Follow up 12 months or PRN  09/09/2022: OV with Jalasia Eskridge NP for acute visit. She developed a cough sometime around Thanksgiving. Has been around numerous people who have been coughing. Did not perform any viral testing. She denies any URI symptoms. Her cough is mostly dry but occasional she gets up tiny amount of yellow sputum. She has been wheezing more and her breathing feels a little more short winded compared to her baseline. She denies fevers, chills, hemoptysis, orthopnea, leg swelling. Eating and drinking well. She is using her [facility] as prescribed. Does not have an albuterol inhaler. She did have an episode with coughing fit and a lot of wheezing yesterday, which as brought on by a friend's perfume. This improved once she got home.   03/16/2023: Ov with Terryn Redner NP for acute visit. She has been having more trouble with shortness of breath recently. Noticed it more since the weather warmed up/over the past few months.  Tends to be worse in the mornings.  She has to use her rescue inhaler, which she was not having to do previously.  She has a rare cough, with minimal sputum production that is clear.  Does not seem to be any worse than her normal.  Not having any chest tightness, wheezing, lower extremity swelling, orthopnea,  palpitations.  She is feeling a bit more fatigued.  She does have very loud snoring at night.  She has been told that she stops breathing when she sleeps at night by friend.  She is not sure if this is anything to do with her current symptoms.  She does not want to have a home sleep study right now.  Will consider it at a later date. She has been taking low dose THC edibles to help manage her chronic pain. She does not smoke marijuana. Hasn't noticed a correlation between the edibles and her dyspnea but wasn't sure this was playing a role.   05/16/2023: Today - follow up Patient presents today for follow up but is also having some worsening acute symptoms. She feels like her breathing has been more short over the last few weeks. She's still having a dry cough, which is more frequent. Not producing any sputum. Wheezing is more noticeable. Denies any fevers, chills, hemoptysis, URI symptoms, lower extremity swelling, orthopnea, palpitations. She is not using her rescue inhaler much. She is using her dulera and spiriva, that we started at her last visit. She never started the singulair. She is open to prednisone now.  FeNO 28 ppb  Allergies  Allergen Reactions   Prednisone Other (See Comments)    SEVERE PANIC ATTACK   Methotrexate Other (See Comments)   Other Other (See Comments)   Tramadol Nausea Only   Oxycodone Hcl Rash  Sulfa Antibiotics Rash    Immunization History  Administered Date(s) Administered   19-influenza Whole 07/04/2018   Influenza Split 06/30/2010, 09/07/2011, 08/04/2014, 07/05/2015, 06/02/2016, 07/11/2020   Influenza Whole 07/05/2011   Influenza,inj,Quad PF,6+ Mos 06/02/2016, 08/22/2019   Influenza-Unspecified 09/06/2011, 07/04/2014, 08/19/2015, 07/04/2016, 07/04/2017, 07/04/2018, 09/04/2019   PFIZER(Purple Top)SARS-COV-2 Vaccination 10/24/2019, 11/14/2019   Pneumococcal Polysaccharide-23 10/05/2007, 08/22/2019   Tdap 09/08/2011    Past Medical History:  Diagnosis Date    Anxiety    Arthritis    Asthma    daily inhaler   Back pain    Carpal tunnel syndrome, right upper limb    Dental crowns present    Depression    GERD (gastroesophageal reflux disease)    Glaucoma    History of kidney stones    Hyperlipidemia    Joint pain    Limited joint range of motion    cervical spine and jaw - due to RA   Pituitary adenoma (HCC)    Rheumatoid arthritis(714.0)    Scalp cyst 10/2016   Sciatica    SOB (shortness of breath)    Swallowing difficulty    Vitamin D deficiency     Tobacco History: Social History   Tobacco Use  Smoking Status Never  Smokeless Tobacco Never   Counseling given: Not Answered   Outpatient Medications Prior to Visit  Medication Sig Dispense Refill   albuterol (VENTOLIN HFA) 108 (90 Base) MCG/ACT inhaler Inhale 2 puffs into the lungs every 6 (six) hours as needed for wheezing or shortness of breath. 8 g 2   alendronate (FOSAMAX) 70 MG tablet Take 70 mg by mouth once a week.     azithromycin (ZITHROMAX) 250 MG tablet Take 2 tablets on day one then 1 tablet daily for four additional days 6 tablet 0   Baclofen 5 MG TABS Take 5 mg by mouth 2 (two) times daily. 30 tablet 0   benzonatate (TESSALON) 200 MG capsule Take 1 capsule (200 mg total) by mouth 3 (three) times daily as needed for cough. 30 capsule 1   brimonidine (ALPHAGAN) 0.2 % ophthalmic solution      celecoxib (CELEBREX) 100 MG capsule Take 100 mg by mouth daily. Take 1 tablet every other day     dorzolamide-timolol (COSOPT) 22.3-6.8 MG/ML ophthalmic solution      esomeprazole (NEXIUM) 40 MG capsule Take 40 mg by mouth daily as needed.     HYDROcodone-acetaminophen (NORCO/VICODIN) 5-325 MG tablet As needed     latanoprost (XALATAN) 0.005 % ophthalmic solution SMARTSIG:In Eye(s)     ORENCIA 125 MG/ML SOSY      phentermine (ADIPEX-P) 37.5 MG tablet 1/2 to 1 tablet daily. 90 tablet 0   Tiotropium Bromide Monohydrate (SPIRIVA RESPIMAT) 1.25 MCG/ACT AERS Inhale 2 puffs into  the lungs daily. 4 g 5   venlafaxine XR (EFFEXOR-XR) 37.5 MG 24 hr capsule Take 37.5 mg by mouth daily.     mometasone-formoterol (DULERA) 200-5 MCG/ACT AERO INHALE TWO PUFFS BY MOUTH TWICE A DAY: EVERY MORNING AND 12 HOURS AFTERWARDS 13 g 11   montelukast (SINGULAIR) 10 MG tablet Take 1 tablet (10 mg total) by mouth at bedtime. 30 tablet 5   promethazine-dextromethorphan (PROMETHAZINE-DM) 6.25-15 MG/5ML syrup Take 5 mLs by mouth every 6 (six) hours as needed for cough. (Patient not taking: Reported on 05/16/2023) 118 mL 0   No facility-administered medications prior to visit.     Review of Systems:   Constitutional: No weight loss or gain, night sweats, fevers, chills, or lassitude. +  fatigue  HEENT: No headaches, difficulty swallowing, tooth/dental problems, or sore throat. No sneezing, itching, ear ache, nasal congestion, or post nasal drip CV:  No chest pain, orthopnea, PND, swelling in lower extremities, anasarca, dizziness, palpitations, syncope Resp: +shortness of breath with exertion; dry cough; wheezing. No hemoptysis. No chest wall deformity GI:  No heartburn, indigestion, abdominal pain, nausea, vomiting, diarrhea GU: No dysuria, change in color of urine, urgency or frequency.   Skin: No rash, lesions, ulcerations MSK:  No joint pain or swelling.   Neuro: No dizziness or lightheadedness.  Psych: No depression or anxiety. Mood stable.     Physical Exam:  BP 124/72   Pulse 88   Ht 4\' 10"  (1.473 m)   Wt 146 lb (66.2 kg)   SpO2 96%   BMI 30.51 kg/m   GEN: Pleasant, interactive, well-appearing; in no acute distress HEENT:  Normocephalic and atraumatic. PERRLA. Sclera white. Nasal turbinates pink, moist and patent bilaterally. No rhinorrhea present. Oropharynx pink and moist, without exudate or edema. No lesions, ulcerations, or postnasal drip.  NECK:  Supple w/ fair ROM. No JVD present. Normal carotid impulses w/o bruits. Thyroid symmetrical with no goiter or nodules  palpated. No lymphadenopathy.   CV: RRR, no m/r/g, no peripheral edema. Pulses intact, +2 bilaterally. No cyanosis, pallor or clubbing. PULMONARY:  Unlabored, regular breathing. End expiratory wheeze bilaterally A&P. No accessory muscle use.  GI: BS present and normoactive. Soft, non-tender to palpation. No organomegaly or masses detected.  MSK: No erythema, warmth or tenderness. Cap refil <2 sec all extrem. No deformities or joint swelling noted.  Neuro: A/Ox3. No focal deficits noted.   Skin: Warm, no lesions or rashe Psych: Normal affect and behavior. Judgement and thought content appropriate.     Lab Results:  CBC    Component Value Date/Time   WBC 10.3 03/16/2023 1622   RBC 4.30 03/16/2023 1622   HGB 13.7 03/16/2023 1622   HCT 41.5 03/16/2023 1622   PLT 350.0 03/16/2023 1622   MCV 96.7 03/16/2023 1622   MCH 31.7 11/01/2021 1835   MCHC 32.9 03/16/2023 1622   RDW 12.6 03/16/2023 1622   LYMPHSABS 2.6 03/16/2023 1622   MONOABS 0.9 03/16/2023 1622   EOSABS 0.4 03/16/2023 1622   BASOSABS 0.1 03/16/2023 1622    BMET    Component Value Date/Time   NA 140 03/16/2023 1622   NA 143 11/25/2020 0000   K 4.3 03/16/2023 1622   CL 103 03/16/2023 1622   CO2 28 03/16/2023 1622   GLUCOSE 98 03/16/2023 1622   BUN 29 (H) 03/16/2023 1622   BUN 24 (A) 11/25/2020 0000   CREATININE 1.44 (H) 03/16/2023 1622   CALCIUM 9.8 03/16/2023 1622   GFRNONAA >60 11/01/2021 1835    BNP No results found for: "BNP"   Imaging:  No results found.  Administration History     None          Latest Ref Rng & Units 09/13/2018   11:40 AM 07/13/2017   11:43 AM 05/30/2015   10:29 AM  PFT Results  FVC-Pre L 1.83  1.81  1.80   FVC-Predicted Pre % 63  62  60   FVC-Post L 1.84  1.92  1.86   FVC-Predicted Post % 63  65  62   Pre FEV1/FVC % % 47  43  47   Post FEV1/FCV % % 48  44  48   FEV1-Pre L 0.85  0.78  0.85   FEV1-Predicted Pre % 37  33  35   FEV1-Post L 0.88  0.85  0.90   DLCO  uncorrected ml/min/mmHg 19.80  20.14  22.33   DLCO UNC% % 112  114  127   DLCO corrected ml/min/mmHg  19.96    DLCO COR %Predicted %  113    DLVA Predicted % 145  148  164   TLC L 4.83  4.71    TLC % Predicted % 112  109    RV % Predicted % 170  175      Lab Results  Component Value Date   NITRICOXIDE 11 05/01/2018        Assessment & Plan:   Moderate asthma with acute exacerbation Unresolved exacerbation with worsening of symptoms. Elevated exhaled nitric oxide testing. She was agreeable to steroid course; prefers to stay at a lower dose. Prednisone taper and cough control measures advised. Previous workup with elevated peripheral eosinophils; BNP nl, CXR clear. If symptoms do not improve, will repeat imaging. Advised to start singulair for trigger prevention. Medication side effect profile reviewed. Action plan in place.  Patient Instructions  Continue Dulera 2 puffs Twice daily. Brush tongue and rinse mouth afterwards Continue Albuterol inhaler 2 puffs every 6 hours as needed for shortness of breath or wheezing. Notify if symptoms persist despite rescue inhaler/neb use.  Continue nexium 1 tab daily  Continue spiriva 2 puffs daily with Dulera  Start singulair (montelukast) 1 tab At bedtime for asthma Prednisone 20 mg daily for 5 days then take 10 mg daily for 5 days. Take in AM with food    Follow up in 4-6 weeks with Dr. Sherene Sires or Katie Alanie Syler,NP. If symptoms do not improve or worsen, please contact office for sooner follow up or seek emergency care.     I spent 35 minutes of dedicated to the care of this patient on the date of this encounter to include pre-visit review of records, face-to-face time with the patient discussing conditions above, post visit ordering of testing, clinical documentation with the electronic health record, making appropriate referrals as documented, and communicating necessary findings to members of the patients care team.  Noemi Chapel,  NP 05/16/2023  Pt aware and understands NP's role.

## 2023-05-20 ENCOUNTER — Telehealth: Payer: Self-pay | Admitting: Internal Medicine

## 2023-05-20 DIAGNOSIS — J4541 Moderate persistent asthma with (acute) exacerbation: Secondary | ICD-10-CM

## 2023-05-20 MED ORDER — HYDROCODONE BIT-HOMATROP MBR 5-1.5 MG/5ML PO SOLN
5.0000 mL | Freq: Four times a day (QID) | ORAL | 0 refills | Status: DC | PRN
Start: 2023-05-20 — End: 2023-06-20

## 2023-05-20 MED ORDER — AZITHROMYCIN 250 MG PO TABS
ORAL_TABLET | ORAL | 0 refills | Status: DC
Start: 2023-05-20 — End: 2023-06-20

## 2023-05-20 NOTE — Telephone Encounter (Signed)
PT said she was in to see Ms. Cobb on 8/12. States she still has a nagging, dry cough and Ms. Cobb said we'd call in something for her if current tx plan (Pred) is not working.   Karin Golden on [address]/[place]  Her # [phone]

## 2023-05-20 NOTE — Telephone Encounter (Signed)
Spoke with pt. Z pack and hycodan sent to pharmacy. Medication education provided. She is not taking the norco on her medication list or other narcotics. Instructed to call Monday to let us know how she is feeling.

## 2023-06-20 ENCOUNTER — Ambulatory Visit: Payer: Medicare Other | Admitting: Nurse Practitioner

## 2023-06-20 ENCOUNTER — Encounter: Payer: Self-pay | Admitting: Nurse Practitioner

## 2023-06-20 VITALS — BP 132/84 | HR 87 | Temp 98.3°F | Ht <= 58 in | Wt 147.4 lb

## 2023-06-20 DIAGNOSIS — J4540 Moderate persistent asthma, uncomplicated: Secondary | ICD-10-CM

## 2023-06-20 NOTE — Progress Notes (Signed)
@Patient  ID: Kayla [name], female    DOB: Jun 27, 1966, 57 y.o.   MRN: [id number]  Chief Complaint  Patient presents with   Follow-up    Doing well.    Referring provider: Thana Ates, MD  HPI: 57 year old female, never smoker followed for asthma. She is a patient of Dr. Thurston Hole and last seen in office 03/16/2023 by [facility] NP. Past medical history significant for rheumatoid arthritis, GERD, pituitary microadenoma, Paget's disease, anxiety.   TEST/EVENTS:  09/13/2018 PFT: FVC 63, FEV1 37, 48, TLC 112, DLCOunc 112  11/01/2021 eos 400 03/16/2023 CXR: no acute process  02/22/2022: OV with Dr. Sherene Sires. Stable on Dulera. Follow up 12 months or PRN  09/09/2022: OV with Mija Effertz NP for acute visit. She developed a cough sometime around Thanksgiving. Has been around numerous people who have been coughing. Did not perform any viral testing. She denies any URI symptoms. Her cough is mostly dry but occasional she gets up tiny amount of yellow sputum. She has been wheezing more and her breathing feels a little more short winded compared to her baseline. She denies fevers, chills, hemoptysis, orthopnea, leg swelling. Eating and drinking well. She is using her [facility] as prescribed. Does not have an albuterol inhaler. She did have an episode with coughing fit and a lot of wheezing yesterday, which as brought on by a friend's perfume. This improved once she got home.   03/16/2023: Ov with Kirke Breach NP for acute visit. She has been having more trouble with shortness of breath recently. Noticed it more since the weather warmed up/over the past few months.  Tends to be worse in the mornings.  She has to use her rescue inhaler, which she was not having to do previously.  She has a rare cough, with minimal sputum production that is clear.  Does not seem to be any worse than her normal.  Not having any chest tightness, wheezing, lower extremity swelling, orthopnea, palpitations.  She is feeling a bit more fatigued.  She does have very  loud snoring at night.  She has been told that she stops breathing when she sleeps at night by friend.  She is not sure if this is anything to do with her current symptoms.  She does not want to have a home sleep study right now.  Will consider it at a later date. She has been taking low dose THC edibles to help manage her chronic pain. She does not smoke marijuana. Hasn't noticed a correlation between the edibles and her dyspnea but wasn't sure this was playing a role.   05/16/2023: OV with Veleta Yamamoto NP for follow up but is also having some worsening acute symptoms. She feels like her breathing has been more short over the last few weeks. She's still having a dry cough, which is more frequent. Not producing any sputum. Wheezing is more noticeable. Denies any fevers, chills, hemoptysis, URI symptoms, lower extremity swelling, orthopnea, palpitations. She is not using her rescue inhaler much. She is using her dulera and spiriva, that we started at her last visit. She never started the singulair. She is open to prednisone now. FeNO 28 ppb  06/20/2023: Today - follow up Patient presents today for follow up. After our last visit, she called the office with persistent, nagging cough. She was treated with z pack. She tells me she's feeling much better. Not 100% back to normal but overall improved. Cough has mostly resolved. Not noticing wheezing. Breathing feels better. She denies any fevers,  chills, chest congestion, sinus symptoms. She did not tolerate the steroids well but did complete them. She is not currently taking the singulair because it makes her eyes dry. She is taking Dulera and Spiriva. No rescue use.   Allergies  Allergen Reactions   Prednisone Other (See Comments)    SEVERE PANIC ATTACK   Methotrexate Other (See Comments)   Other Other (See Comments)   Tramadol Nausea Only   Oxycodone Hcl Rash   Sulfa Antibiotics Rash    Immunization History  Administered Date(s) Administered   19-influenza  Whole 07/04/2018   Influenza Split 06/30/2010, 09/07/2011, 08/04/2014, 07/05/2015, 06/02/2016, 07/11/2020   Influenza Whole 07/05/2011   Influenza,inj,Quad PF,6+ Mos 06/02/2016, 08/22/2019   Influenza-Unspecified 09/06/2011, 07/04/2014, 08/19/2015, 07/04/2016, 07/04/2017, 07/04/2018, 09/04/2019   PFIZER(Purple Top)SARS-COV-2 Vaccination 10/24/2019, 11/14/2019   Pneumococcal Polysaccharide-23 10/05/2007, 08/22/2019   Tdap 09/08/2011    Past Medical History:  Diagnosis Date   Anxiety    Arthritis    Asthma    daily inhaler   Back pain    Carpal tunnel syndrome, right upper limb    Dental crowns present    Depression    GERD (gastroesophageal reflux disease)    Glaucoma    History of kidney stones    Hyperlipidemia    Joint pain    Limited joint range of motion    cervical spine and jaw - due to RA   Pituitary adenoma (HCC)    Rheumatoid arthritis(714.0)    Scalp cyst 10/2016   Sciatica    SOB (shortness of breath)    Swallowing difficulty    Vitamin D deficiency     Tobacco History: Social History   Tobacco Use  Smoking Status Never  Smokeless Tobacco Never   Counseling given: Not Answered   Outpatient Medications Prior to Visit  Medication Sig Dispense Refill   albuterol (VENTOLIN HFA) 108 (90 Base) MCG/ACT inhaler Inhale 2 puffs into the lungs every 6 (six) hours as needed for wheezing or shortness of breath. 8 g 2   alendronate (FOSAMAX) 70 MG tablet Take 70 mg by mouth once a week.     Baclofen 5 MG TABS Take 5 mg by mouth 2 (two) times daily. 30 tablet 0   celecoxib (CELEBREX) 100 MG capsule Take 100 mg by mouth daily. Take 1 tablet every other day     dorzolamide-timolol (COSOPT) 22.3-6.8 MG/ML ophthalmic solution      esomeprazole (NEXIUM) 40 MG capsule Take 40 mg by mouth daily as needed.     latanoprost (XALATAN) 0.005 % ophthalmic solution SMARTSIG:In Eye(s)     mometasone-formoterol (DULERA) 200-5 MCG/ACT AERO INHALE TWO PUFFS BY MOUTH TWICE A DAY:  EVERY MORNING AND 12 HOURS AFTERWARDS 13 g 11   montelukast (SINGULAIR) 10 MG tablet Take 1 tablet (10 mg total) by mouth at bedtime. 30 tablet 5   ORENCIA 125 MG/ML SOSY      phentermine (ADIPEX-P) 37.5 MG tablet 1/2 to 1 tablet daily. 90 tablet 0   Tiotropium Bromide Monohydrate (SPIRIVA RESPIMAT) 1.25 MCG/ACT AERS Inhale 2 puffs into the lungs daily. 4 g 5   venlafaxine XR (EFFEXOR-XR) 37.5 MG 24 hr capsule Take 37.5 mg by mouth daily.     azithromycin (ZITHROMAX) 250 MG tablet Take 2 tablets on day one then take 1 tablet daily for four additional days (Patient not taking: Reported on 06/20/2023) 6 tablet 0   benzonatate (TESSALON) 200 MG capsule Take 1 capsule (200 mg total) by mouth 3 (three) times daily  as needed for cough. (Patient not taking: Reported on 06/20/2023) 30 capsule 1   brimonidine (ALPHAGAN) 0.2 % ophthalmic solution  (Patient not taking: Reported on 06/20/2023)     HYDROcodone bit-homatropine (HYCODAN) 5-1.5 MG/5ML syrup Take 5 mLs by mouth every 6 (six) hours as needed for cough. Do not drive after taking. Do not take with other narcotics. (Patient not taking: Reported on 06/20/2023) 240 mL 0   HYDROcodone-acetaminophen (NORCO/VICODIN) 5-325 MG tablet As needed (Patient not taking: Reported on 06/20/2023)     predniSONE (DELTASONE) 10 MG tablet Take 2 tablets daily for 5 days then take 1 tablet daily for 5 days then stop (Patient not taking: Reported on 06/20/2023) 15 tablet 0   No facility-administered medications prior to visit.     Review of Systems:   Constitutional: No weight loss or gain, night sweats, fevers, chills, or lassitude. +fatigue (improved) HEENT: No headaches, difficulty swallowing, tooth/dental problems, or sore throat. No sneezing, itching, ear ache, nasal congestion, or post nasal drip CV:  No chest pain, orthopnea, PND, swelling in lower extremities, anasarca, dizziness, palpitations, syncope Resp: +shortness of breath with exertion (improved), improved dry  cough. No wheezing. No hemoptysis. No chest wall deformity GI:  No heartburn, indigestion, abdominal pain, nausea, vomiting, diarrhea GU: No dysuria, change in color of urine, urgency or frequency.   Skin: No rash, lesions, ulcerations MSK:  No joint pain or swelling.   Neuro: No dizziness or lightheadedness.  Psych: No depression or anxiety. Mood stable.     Physical Exam:  BP 132/84 (BP Location: Right Arm, Patient Position: Sitting, Cuff Size: Normal)   Pulse 87   Temp 98.3 F (36.8 C) (Oral)   Ht 4\' 10"  (1.473 m)   Wt 147 lb 6.4 oz (66.9 kg)   SpO2 95%   BMI 30.81 kg/m   GEN: Pleasant, interactive, well-appearing; in no acute distress HEENT:  Normocephalic and atraumatic. PERRLA. Sclera white. Nasal turbinates pink, moist and patent bilaterally. No rhinorrhea present. Oropharynx pink and moist, without exudate or edema. No lesions, ulcerations, or postnasal drip.  NECK:  Supple w/ fair ROM. No JVD present. Normal carotid impulses w/o bruits. Thyroid symmetrical with no goiter or nodules palpated. No lymphadenopathy.   CV: RRR, no m/r/g, no peripheral edema. Pulses intact, +2 bilaterally. No cyanosis, pallor or clubbing. PULMONARY:  Unlabored, regular breathing. Clear bilaterally A&P w/o wheezes/rales/rhonchi. No accessory muscle use.  GI: BS present and normoactive. Soft, non-tender to palpation. No organomegaly or masses detected.  MSK: No erythema, warmth or tenderness. Cap refil <2 sec all extrem.  Neuro: A/Ox3. No focal deficits noted.   Skin: Warm, no lesions or rashe Psych: Normal affect and behavior. Judgement and thought content appropriate.     Lab Results:  CBC    Component Value Date/Time   WBC 10.3 03/16/2023 1622   RBC 4.30 03/16/2023 1622   HGB 13.7 03/16/2023 1622   HCT 41.5 03/16/2023 1622   PLT 350.0 03/16/2023 1622   MCV 96.7 03/16/2023 1622   MCH 31.7 11/01/2021 1835   MCHC 32.9 03/16/2023 1622   RDW 12.6 03/16/2023 1622   LYMPHSABS 2.6  03/16/2023 1622   MONOABS 0.9 03/16/2023 1622   EOSABS 0.4 03/16/2023 1622   BASOSABS 0.1 03/16/2023 1622    BMET    Component Value Date/Time   NA 140 03/16/2023 1622   NA 143 11/25/2020 0000   K 4.3 03/16/2023 1622   CL 103 03/16/2023 1622   CO2 28 03/16/2023 1622  GLUCOSE 98 03/16/2023 1622   BUN 29 (H) 03/16/2023 1622   BUN 24 (A) 11/25/2020 0000   CREATININE 1.44 (H) 03/16/2023 1622   CALCIUM 9.8 03/16/2023 1622   GFRNONAA >60 11/01/2021 1835    BNP No results found for: "BNP"   Imaging:  No results found.  Administration History     None          Latest Ref Rng & Units 09/13/2018   11:40 AM 07/13/2017   11:43 AM 05/30/2015   10:29 AM  PFT Results  FVC-Pre L 1.83  1.81  1.80   FVC-Predicted Pre % 63  62  60   FVC-Post L 1.84  1.92  1.86   FVC-Predicted Post % 63  65  62   Pre FEV1/FVC % % 47  43  47   Post FEV1/FCV % % 48  44  48   FEV1-Pre L 0.85  0.78  0.85   FEV1-Predicted Pre % 37  33  35   FEV1-Post L 0.88  0.85  0.90   DLCO uncorrected ml/min/mmHg 19.80  20.14  22.33   DLCO UNC% % 112  114  127   DLCO corrected ml/min/mmHg  19.96    DLCO COR %Predicted %  113    DLVA Predicted % 145  148  164   TLC L 4.83  4.71    TLC % Predicted % 112  109    RV % Predicted % 170  175      Lab Results  Component Value Date   NITRICOXIDE 11 05/01/2018        Assessment & Plan:   Moderate persistent asthma Resolved exacerbation. Clinically improved. Advised her to resume singulair going into fall season, if able to tolerate. She is agreeable to this. She will continue triple therapy regimen. Action plan in place.  Patient Instructions  Continue Dulera 2 puffs Twice daily. Brush tongue and rinse mouth afterwards Continue Albuterol inhaler 2 puffs every 6 hours as needed for shortness of breath or wheezing. Notify if symptoms persist despite rescue inhaler/neb use.  Continue nexium 1 tab daily  Continue spiriva 2 puffs daily with Dulera Continue  singulair (montelukast) 1 tab At bedtime for asthma   Follow up in 4 months with Dr. Sherene Sires or Katie Seidy Labreck,NP. If symptoms do not improve or worsen, please contact office for sooner follow up or seek emergency care.     I spent 25 minutes of dedicated to the care of this patient on the date of this encounter to include pre-visit review of records, face-to-face time with the patient discussing conditions above, post visit ordering of testing, clinical documentation with the electronic health record, making appropriate referrals as documented, and communicating necessary findings to members of the patients care team.  Noemi Chapel, NP 06/20/2023  Pt aware and understands NP's role.

## 2023-06-20 NOTE — Patient Instructions (Signed)
Continue Dulera 2 puffs Twice daily. Brush tongue and rinse mouth afterwards Continue Albuterol inhaler 2 puffs every 6 hours as needed for shortness of breath or wheezing. Notify if symptoms persist despite rescue inhaler/neb use.  Continue nexium 1 tab daily  Continue spiriva 2 puffs daily with Dulera Continue singulair (montelukast) 1 tab At bedtime for asthma   Follow up in 4 months with Dr. Sherene Sires or Katie Haidynn Almendarez,NP. If symptoms do not improve or worsen, please contact office for sooner follow up or seek emergency care.

## 2023-06-20 NOTE — Assessment & Plan Note (Signed)
Resolved exacerbation. Clinically improved. Advised her to resume singulair going into fall season, if able to tolerate. She is agreeable to this. She will continue triple therapy regimen. Action plan in place.  Patient Instructions  Continue Dulera 2 puffs Twice daily. Brush tongue and rinse mouth afterwards Continue Albuterol inhaler 2 puffs every 6 hours as needed for shortness of breath or wheezing. Notify if symptoms persist despite rescue inhaler/neb use.  Continue nexium 1 tab daily  Continue spiriva 2 puffs daily with Dulera Continue singulair (montelukast) 1 tab At bedtime for asthma   Follow up in 4 months with Dr. Sherene Sires or Katie Marion Rosenberry,NP. If symptoms do not improve or worsen, please contact office for sooner follow up or seek emergency care.

## 2023-09-12 ENCOUNTER — Other Ambulatory Visit: Payer: Self-pay | Admitting: Nurse Practitioner

## 2023-09-12 DIAGNOSIS — J4541 Moderate persistent asthma with (acute) exacerbation: Secondary | ICD-10-CM

## 2023-09-21 ENCOUNTER — Telehealth: Payer: Self-pay | Admitting: Gastroenterology

## 2023-09-21 NOTE — Telephone Encounter (Signed)
The following patient will be requesting records from Dr. Marca Ancona and [facility] to be sent to [place] for a transfer request. She is not happy with the care she receives with them. They could not perform procedures properly. She is requesting Dr. Meridee Score to be her provider.

## 2023-09-23 ENCOUNTER — Telehealth: Payer: Self-pay | Admitting: Internal Medicine

## 2023-09-23 DIAGNOSIS — J4541 Moderate persistent asthma with (acute) exacerbation: Secondary | ICD-10-CM

## 2023-09-23 MED ORDER — PROMETHAZINE-DM 6.25-15 MG/5ML PO SYRP: 5.0000 mL | ORAL_SOLUTION | Freq: Four times a day (QID) | ORAL | 0 refills | Status: DC | PRN

## 2023-09-23 MED ORDER — PREDNISONE 10 MG PO TABS: ORAL_TABLET | ORAL | 0 refills | Status: DC

## 2023-09-23 NOTE — Telephone Encounter (Signed)
Patient has not been able to work due to cough x 8 days. She has been using her 3 inhalers like clockwork. She has taken 5 mg x 3 days that her friend gave her and she has been using mucinex. Covid test negative, no fever. Cough is mostly dry but occasionally productive in am and then back to dry. She is going crazy. Is there anything else she can do?

## 2023-09-23 NOTE — Telephone Encounter (Signed)
I sent rx for prednisone. This is a taper like we have done before. She does not do well with higher doses so I started her at 20 mg. She's tolerated this well previously. I also sent in promethazine DM cough syrup. Do not drive after taking. May cause drowsiness. Thanks.

## 2023-09-23 NOTE — Telephone Encounter (Signed)
Patient has a cough and a wheeze. She has been using her inhalers, she's had 5 mg of prednisone for 3 days and mussinex. Patient would like a call back to see if there is anything else she can do for the cough . Call back number [phone]

## 2023-09-23 NOTE — Telephone Encounter (Signed)
LMOM for patient to check pharmacy.

## 2023-10-20 ENCOUNTER — Ambulatory Visit: Payer: Medicare Other | Admitting: Nurse Practitioner

## 2023-10-20 ENCOUNTER — Encounter: Payer: Self-pay | Admitting: Nurse Practitioner

## 2023-10-20 VITALS — BP 120/78 | HR 104 | Ht <= 58 in | Wt 145.2 lb

## 2023-10-20 DIAGNOSIS — R059 Cough, unspecified: Secondary | ICD-10-CM

## 2023-10-20 DIAGNOSIS — R058 Other specified cough: Secondary | ICD-10-CM | POA: Insufficient documentation

## 2023-10-20 DIAGNOSIS — J4540 Moderate persistent asthma, uncomplicated: Secondary | ICD-10-CM

## 2023-10-20 LAB — POCT EXHALED NITRIC OXIDE: FeNO level (ppb): 15

## 2023-10-20 MED ORDER — HYDROCODONE BIT-HOMATROP MBR 5-1.5 MG/5ML PO SOLN: 5.0000 mL | Freq: Four times a day (QID) | ORAL | 0 refills | Status: DC | PRN

## 2023-10-20 MED ORDER — FLUTICASONE PROPIONATE 50 MCG/ACT NA SUSP: 2.0000 | Freq: Every day | NASAL | 2 refills | Status: AC

## 2023-10-20 NOTE — Assessment & Plan Note (Signed)
No evidence of acute exacerbation. Continue current maintenance regimen and PRN SABA. Action plan in place.

## 2023-10-20 NOTE — Progress Notes (Signed)
@Patient  ID: Kayla [name], female    DOB: 02-Jul-1966, 58 y.o.   MRN: [id number]  Chief Complaint  Patient presents with   Follow-up    Moderate persistent asthma without complication F/U visit. Pt c/o Cough with clear/yellow sputum for 6 weeks.    Referring provider: Thana Ates, MD  HPI: 58 year old female, never smoker followed for asthma. She is a patient of Dr. Thurston Hole and last seen in office 06/20/2023 by Allison Quarry NP. Past medical history significant for rheumatoid arthritis, GERD, pituitary microadenoma, Paget's disease, anxiety.   TEST/EVENTS:  09/13/2018 PFT: FVC 63, FEV1 37, 48, TLC 112, DLCOunc 112  11/01/2021 eos 400 03/16/2023 CXR: no acute process  02/22/2022: OV with Dr. Sherene Sires. Stable on Dulera. Follow up 12 months or PRN  09/09/2022: OV with Gagandeep Pettet NP for acute visit. She developed a cough sometime around Thanksgiving. Has been around numerous people who have been coughing. Did not perform any viral testing. She denies any URI symptoms. Her cough is mostly dry but occasional she gets up tiny amount of yellow sputum. She has been wheezing more and her breathing feels a little more short winded compared to her baseline. She denies fevers, chills, hemoptysis, orthopnea, leg swelling. Eating and drinking well. She is using her [facility] as prescribed. Does not have an albuterol inhaler. She did have an episode with coughing fit and a lot of wheezing yesterday, which as brought on by a friend's perfume. This improved once she got home.   03/16/2023: Ov with Alicea Wente NP for acute visit. She has been having more trouble with shortness of breath recently. Noticed it more since the weather warmed up/over the past few months.  Tends to be worse in the mornings.  She has to use her rescue inhaler, which she was not having to do previously.  She has a rare cough, with minimal sputum production that is clear.  Does not seem to be any worse than her normal.  Not having any chest tightness, wheezing, lower  extremity swelling, orthopnea, palpitations.  She is feeling a bit more fatigued.  She does have very loud snoring at night.  She has been told that she stops breathing when she sleeps at night by friend.  She is not sure if this is anything to do with her current symptoms.  She does not want to have a home sleep study right now.  Will consider it at a later date. She has been taking low dose THC edibles to help manage her chronic pain. She does not smoke marijuana. Hasn't noticed a correlation between the edibles and her dyspnea but wasn't sure this was playing a role.   05/16/2023: OV with Kam Rahimi NP for follow up but is also having some worsening acute symptoms. She feels like her breathing has been more short over the last few weeks. She's still having a dry cough, which is more frequent. Not producing any sputum. Wheezing is more noticeable. Denies any fevers, chills, hemoptysis, URI symptoms, lower extremity swelling, orthopnea, palpitations. She is not using her rescue inhaler much. She is using her dulera and spiriva, that we started at her last visit. She never started the singulair. She is open to prednisone now. FeNO 28 ppb  06/20/2023: OV with Madie Cahn NP for follow up. After our last visit, she called the office with persistent, nagging cough. She was treated with z pack. She tells me she's feeling much better. Not 100% back to normal but overall improved. Cough has mostly  resolved. Not noticing wheezing. Breathing feels better. She denies any fevers, chills, chest congestion, sinus symptoms. She did not tolerate the steroids well but did complete them. She is not currently taking the singulair because it makes her eyes dry. She is taking Dulera and Spiriva. No rescue use.   10/20/2023: Today - acute Patient presents today for acute visit. She had gone to [place] with her mother and sisters and when she returned, felt like she had a cold. She saw her PCP in early to mid December, who prescribed steroids  and a z pack. She then called our office on 12/20 with reports of persistent coughing. COVID test had been negative. She was sent in an additional prednisone taper and promethazine DM cough syrup. She tells me today that the prednisone didn't seem to make any difference. She still has a cough, which is primarily non-productive. She is having to clear her throat more frequently as well. She did notice last night that her sinuses seemed more congested and she was having some postnasal drainage. She doesn't necessarily feel short winded. Occasional wheeze. She denies fevers, chills, hemoptysis, facial tenderness, headaches, ear pain. She is using throat lozenges, which help some. She took some remaining hycodan cough syrup last night, which seemed to be the only thing that calmed her cough down so she could sleep. She's tried other OTC remedies that haven't seemed to help. She is using her Dulera. Albuterol not making much of a difference. Not doing any sinus rinses/sprays.   FeNO 15 ppb    Allergies  Allergen Reactions   Prednisone Other (See Comments)    SEVERE PANIC ATTACK   Methotrexate Other (See Comments)   Other Other (See Comments)   Tramadol Nausea Only   Oxycodone Hcl Rash   Sulfa Antibiotics Rash    Immunization History  Administered Date(s) Administered   19-influenza Whole 07/04/2018   Influenza Split 06/30/2010, 09/07/2011, 08/04/2014, 07/05/2015, 06/02/2016, 07/11/2020   Influenza Whole 07/05/2011   Influenza,inj,Quad PF,6+ Mos 06/02/2016, 08/22/2019   Influenza-Unspecified 09/06/2011, 07/04/2014, 08/19/2015, 07/04/2016, 07/04/2017, 07/04/2018, 09/04/2019   PFIZER(Purple Top)SARS-COV-2 Vaccination 10/24/2019, 11/14/2019   Pneumococcal Polysaccharide-23 10/05/2007, 08/22/2019   Tdap 09/08/2011    Past Medical History:  Diagnosis Date   Anxiety    Arthritis    Asthma    daily inhaler   Back pain    Carpal tunnel syndrome, right upper limb    Dental crowns present     Depression    GERD (gastroesophageal reflux disease)    Glaucoma    History of kidney stones    Hyperlipidemia    Joint pain    Limited joint range of motion    cervical spine and jaw - due to RA   Pituitary adenoma (HCC)    Rheumatoid arthritis(714.0)    Scalp cyst 10/2016   Sciatica    SOB (shortness of breath)    Swallowing difficulty    Vitamin D deficiency     Tobacco History: Social History   Tobacco Use  Smoking Status Never  Smokeless Tobacco Never   Counseling given: Not Answered   Outpatient Medications Prior to Visit  Medication Sig Dispense Refill   albuterol (VENTOLIN HFA) 108 (90 Base) MCG/ACT inhaler Inhale 2 puffs into the lungs every 6 (six) hours as needed for wheezing or shortness of breath. 8 g 2   alendronate (FOSAMAX) 70 MG tablet Take 70 mg by mouth once a week.     celecoxib (CELEBREX) 100 MG capsule Take 100 mg by  mouth daily. Take 1 tablet every other day     dorzolamide-timolol (COSOPT) 22.3-6.8 MG/ML ophthalmic solution      latanoprost (XALATAN) 0.005 % ophthalmic solution SMARTSIG:In Eye(s)     mometasone-formoterol (DULERA) 200-5 MCG/ACT AERO INHALE TWO PUFFS BY MOUTH TWICE A DAY: EVERY MORNING AND 12 HOURS AFTERWARDS 13 g 11   ORENCIA 125 MG/ML SOSY      Tiotropium Bromide Monohydrate (SPIRIVA RESPIMAT) 1.25 MCG/ACT AERS Inhale 2 puffs into the lungs daily. 4 g 5   venlafaxine XR (EFFEXOR-XR) 37.5 MG 24 hr capsule Take 37.5 mg by mouth daily.     promethazine-dextromethorphan (PROMETHAZINE-DM) 6.25-15 MG/5ML syrup Take 5 mLs by mouth 4 (four) times daily as needed. 118 mL 0   Baclofen 5 MG TABS Take 5 mg by mouth 2 (two) times daily. 30 tablet 0   esomeprazole (NEXIUM) 40 MG capsule Take 40 mg by mouth daily as needed.     montelukast (SINGULAIR) 10 MG tablet Take 1 tablet (10 mg total) by mouth at bedtime. 30 tablet 5   phentermine (ADIPEX-P) 37.5 MG tablet 1/2 to 1 tablet daily. 90 tablet 0   predniSONE (DELTASONE) 10 MG tablet Take 2  tablets for 5 days then 1 tablet for 5 days then stop 15 tablet 0   No facility-administered medications prior to visit.     Review of Systems:   Constitutional: No weight loss or gain, night sweats, fevers, chills, or lassitude. +fatigue (baseline) HEENT: No headaches, difficulty swallowing, tooth/dental problems, or sore throat. No sneezing, itching, ear ache +nasal congestion, post nasal drip CV:  No chest pain, orthopnea, PND, swelling in lower extremities, anasarca, dizziness, palpitations, syncope Resp: +shortness of breath with exertion (baseline), non-productive cough; occasional wheeze. No hemoptysis. No chest wall deformity GI:  No heartburn, indigestion, abdominal pain, nausea, vomiting, diarrhea GU: No dysuria, change in color of urine, urgency or frequency.   Skin: No rash, lesions, ulcerations MSK:  No joint pain or swelling.   Neuro: No dizziness or lightheadedness.  Psych: No depression or anxiety. Mood stable.     Physical Exam:  BP 120/78 (BP Location: Right Arm, Patient Position: Sitting, Cuff Size: Normal)   Pulse (!) 104   Ht 4\' 10"  (1.473 m)   Wt 145 lb 3.2 oz (65.9 kg)   SpO2 96%   BMI 30.35 kg/m   GEN: Pleasant, interactive, well-appearing; in no acute distress HEENT:  Normocephalic and atraumatic. PERRLA. Sclera white. Nasal turbinates erythematous, moist and patent bilaterally. Clear rhinorrhea present. Oropharynx erythematous and moist, without exudate or edema. No lesions, ulcerations NECK:  Supple w/ fair ROM. No JVD present. Normal carotid impulses w/o bruits. Thyroid symmetrical with no goiter or nodules palpated. No lymphadenopathy.   CV: RRR, no m/r/g, no peripheral edema. Pulses intact, +2 bilaterally. No cyanosis, pallor or clubbing. PULMONARY:  Unlabored, regular breathing. Clear bilaterally A&P w/o wheezes/rales/rhonchi. No accessory muscle use.  GI: BS present and normoactive. Soft, non-tender to palpation. No organomegaly or masses detected.   MSK: No erythema, warmth or tenderness. Cap refil <2 sec all extrem.  Neuro: A/Ox3. No focal deficits noted.   Skin: Warm, no lesions or rashe Psych: Normal affect and behavior. Judgement and thought content appropriate.     Lab Results:  CBC    Component Value Date/Time   WBC 10.3 03/16/2023 1622   RBC 4.30 03/16/2023 1622   HGB 13.7 03/16/2023 1622   HCT 41.5 03/16/2023 1622   PLT 350.0 03/16/2023 1622   MCV 96.7 03/16/2023  1622   MCH 31.7 11/01/2021 1835   MCHC 32.9 03/16/2023 1622   RDW 12.6 03/16/2023 1622   LYMPHSABS 2.6 03/16/2023 1622   MONOABS 0.9 03/16/2023 1622   EOSABS 0.4 03/16/2023 1622   BASOSABS 0.1 03/16/2023 1622    BMET    Component Value Date/Time   NA 140 03/16/2023 1622   NA 143 11/25/2020 0000   K 4.3 03/16/2023 1622   CL 103 03/16/2023 1622   CO2 28 03/16/2023 1622   GLUCOSE 98 03/16/2023 1622   BUN 29 (H) 03/16/2023 1622   BUN 24 (A) 11/25/2020 0000   CREATININE 1.44 (H) 03/16/2023 1622   CALCIUM 9.8 03/16/2023 1622   GFRNONAA >60 11/01/2021 1835    BNP No results found for: "BNP"   Imaging:  No results found.  Administration History     None          Latest Ref Rng & Units 09/13/2018   11:40 AM 07/13/2017   11:43 AM 05/30/2015   10:29 AM  PFT Results  FVC-Pre L 1.83  1.81  1.80   FVC-Predicted Pre % 63  62  60   FVC-Post L 1.84  1.92  1.86   FVC-Predicted Post % 63  65  62   Pre FEV1/FVC % % 47  43  47   Post FEV1/FCV % % 48  44  48   FEV1-Pre L 0.85  0.78  0.85   FEV1-Predicted Pre % 37  33  35   FEV1-Post L 0.88  0.85  0.90   DLCO uncorrected ml/min/mmHg 19.80  20.14  22.33   DLCO UNC% % 112  114  127   DLCO corrected ml/min/mmHg  19.96    DLCO COR %Predicted %  113    DLVA Predicted % 145  148  164   TLC L 4.83  4.71    TLC % Predicted % 112  109    RV % Predicted % 170  175      Lab Results  Component Value Date   NITRICOXIDE 11 05/01/2018        Assessment & Plan:   Post-viral cough  syndrome Post viral cough with upper airway inflammation likely from persistent cough and postnasal drainage. Recent CXR completed by PCP did not show infection per her report. She does not appear to be having acute asthma exacerbation. Lung exam clear. No response to oral corticosteroids. FeNO testing normal. Will target her sinus symptoms with saline rinses, intranasal steroid and short course of intranasal decongestant. Add on antihistamine. Cough control measures. Limit further upper airway irritation.   Patient Instructions  Continue Dulera 2 puffs Twice daily. Brush tongue and rinse mouth afterwards Continue Albuterol inhaler 2 puffs every 6 hours as needed for shortness of breath or wheezing. Notify if symptoms persist despite rescue inhaler/neb use.  Continue nexium 1 tab daily  Continue spiriva 2 puffs daily with Dulera Continue singulair (montelukast) 1 tab At bedtime for asthma  Saline nasal rinses 1-2 times a day. Use bottled distilled water. Follow 20-30 minutes later with flonase nasal spray 2 sprays each nostril in the morning. You can also use Afrin nasal spray OTC but do not use longer than 3-5 days Chlorpheniramine 4 mg tabs every 6 hours for drainage/cough. If this makes you drowsy, you can just take it at bedtime Hycodan cough syrup 5 mL every 6-8 hours as needed for cough. May cause drowsiness. Do not drive after taking  Upper airway cough syndrome: Suppress your  cough to allow your larynx (voice box) to heal.  Limit talking for the next few days. Avoid throat clearing. Work on cough suppression with the above recommended suppressants.  Use sugar free hard candies or non-menthol cough drops during this time to soothe your throat.  Warm tea with honey and lemon.    Follow up in 6 weeks with Dr. Sherene Sires or Philis Nettle. If symptoms do not improve or worsen, please contact office for sooner follow up or seek emergency care.    Upper airway cough syndrome See above   Moderate  persistent asthma No evidence of acute exacerbation. Continue current maintenance regimen and PRN SABA. Action plan in place.     I spent 35 minutes of dedicated to the care of this patient on the date of this encounter to include pre-visit review of records, face-to-face time with the patient discussing conditions above, post visit ordering of testing, clinical documentation with the electronic health record, making appropriate referrals as documented, and communicating necessary findings to members of the patients care team.  Noemi Chapel, NP 10/20/2023  Pt aware and understands NP's role.

## 2023-10-20 NOTE — Assessment & Plan Note (Signed)
Post viral cough with upper airway inflammation likely from persistent cough and postnasal drainage. Recent CXR completed by PCP did not show infection per her report. She does not appear to be having acute asthma exacerbation. Lung exam clear. No response to oral corticosteroids. FeNO testing normal. Will target her sinus symptoms with saline rinses, intranasal steroid and short course of intranasal decongestant. Add on antihistamine. Cough control measures. Limit further upper airway irritation.   Patient Instructions  Continue Dulera 2 puffs Twice daily. Brush tongue and rinse mouth afterwards Continue Albuterol inhaler 2 puffs every 6 hours as needed for shortness of breath or wheezing. Notify if symptoms persist despite rescue inhaler/neb use.  Continue nexium 1 tab daily  Continue spiriva 2 puffs daily with Dulera Continue singulair (montelukast) 1 tab At bedtime for asthma  Saline nasal rinses 1-2 times a day. Use bottled distilled water. Follow 20-30 minutes later with flonase nasal spray 2 sprays each nostril in the morning. You can also use Afrin nasal spray OTC but do not use longer than 3-5 days Chlorpheniramine 4 mg tabs every 6 hours for drainage/cough. If this makes you drowsy, you can just take it at bedtime Hycodan cough syrup 5 mL every 6-8 hours as needed for cough. May cause drowsiness. Do not drive after taking  Upper airway cough syndrome: Suppress your cough to allow your larynx (voice box) to heal.  Limit talking for the next few days. Avoid throat clearing. Work on cough suppression with the above recommended suppressants.  Use sugar free hard candies or non-menthol cough drops during this time to soothe your throat.  Warm tea with honey and lemon.    Follow up in 6 weeks with Dr. Sherene Sires or Philis Nettle. If symptoms do not improve or worsen, please contact office for sooner follow up or seek emergency care.

## 2023-10-20 NOTE — Patient Instructions (Addendum)
Continue Dulera 2 puffs Twice daily. Brush tongue and rinse mouth afterwards Continue Albuterol inhaler 2 puffs every 6 hours as needed for shortness of breath or wheezing. Notify if symptoms persist despite rescue inhaler/neb use.  Continue nexium 1 tab daily  Continue spiriva 2 puffs daily with Dulera Continue singulair (montelukast) 1 tab At bedtime for asthma  Saline nasal rinses 1-2 times a day. Use bottled distilled water. Follow 20-30 minutes later with flonase nasal spray 2 sprays each nostril in the morning. You can also use Afrin nasal spray OTC but do not use longer than 3-5 days Chlorpheniramine 4 mg tabs every 6 hours for drainage/cough. If this makes you drowsy, you can just take it at bedtime Hycodan cough syrup 5 mL every 6-8 hours as needed for cough. May cause drowsiness. Do not drive after taking  Upper airway cough syndrome: Suppress your cough to allow your larynx (voice box) to heal.  Limit talking for the next few days. Avoid throat clearing. Work on cough suppression with the above recommended suppressants.  Use sugar free hard candies or non-menthol cough drops during this time to soothe your throat.  Warm tea with honey and lemon.    Follow up in 6 weeks with Dr. Sherene Sires or Philis Nettle. If symptoms do not improve or worsen, please contact office for sooner follow up or seek emergency care.

## 2023-10-20 NOTE — Assessment & Plan Note (Signed)
See above

## 2023-10-28 ENCOUNTER — Other Ambulatory Visit: Payer: Self-pay | Admitting: Nurse Practitioner

## 2023-10-28 DIAGNOSIS — J4541 Moderate persistent asthma with (acute) exacerbation: Secondary | ICD-10-CM

## 2023-11-07 DIAGNOSIS — R632 Polyphagia: Secondary | ICD-10-CM | POA: Diagnosis not present

## 2023-11-07 DIAGNOSIS — R0683 Snoring: Secondary | ICD-10-CM | POA: Diagnosis not present

## 2023-11-07 DIAGNOSIS — Z8639 Personal history of other endocrine, nutritional and metabolic disease: Secondary | ICD-10-CM | POA: Diagnosis not present

## 2023-11-07 DIAGNOSIS — K219 Gastro-esophageal reflux disease without esophagitis: Secondary | ICD-10-CM | POA: Diagnosis not present

## 2023-11-09 ENCOUNTER — Telehealth: Payer: Self-pay | Admitting: Gastroenterology

## 2023-11-09 NOTE — Telephone Encounter (Signed)
 Good afternoon Dr.Mansouraty   We received a call from this patient wishing to establish new GI care with you. Patient was seen by [facility] and also had a procedure in 2016 with [facility]. She states she is unhappy with the care received at both clinics. Records were obtained and scanned into [employer] for you to review. Additional records from [facility] are in [place]. Would you please review and advise on scheduling?  Thank you.

## 2023-11-10 NOTE — Telephone Encounter (Signed)
 Quick review of records suggest that patient has a history of intestinal metaplasia noted at [facility].  Was plan for an EGD with dilation with Dr. Saintclair back in 2022 with continued findings of intestinal metaplasia and chronic inactive gastritis with plan for a 3-year follow-up endoscopy in 2025.  2019 colonoscopy showed evidence of 2 tubular adenomas as well as diverticulosis with plan for 5-year  Surveillance.  Not clear that she had that procedure in 2024 or 2025.  Please find out with the patient if she has not had had an EGD/colonoscopy done at [facility], because I do not see the actual records of that.  If she has, we need to review that, to determine the timing of her repeat endoscopic evaluation.  Could consider potential direct EGD/colonoscopy versus clinic evaluation and discussion.  Will see what her responses in regards to the reported EGD/colonoscopy to be done at [facility] in 2020 01/2024.

## 2023-11-11 NOTE — Telephone Encounter (Signed)
 Good afternoon, I called [facility], they stated the last procedure patient had was the EGD in 2022.   Thanks.

## 2023-11-13 NOTE — Telephone Encounter (Signed)
 With this history, I am OK with the following:  1) Patient can be accepted to  GI care 2) Until seen in clinic or for procedures, issues pertaining to GI will need to be assessed by PCP 3) She can be scheduled for surveillance Colonoscopy in [facility] directly 4) Due to history of Gastric Intestinal Metaplasia, we can schedule her for surveillance EGD at same time of Colonoscopy in [facility] 5) If patient wants to be seen in clinic first, then schedule with me or APP (and I'll supervise).  Thanks. GM

## 2023-11-14 ENCOUNTER — Encounter: Payer: Self-pay | Admitting: Gastroenterology

## 2023-11-17 DIAGNOSIS — K219 Gastro-esophageal reflux disease without esophagitis: Secondary | ICD-10-CM | POA: Diagnosis not present

## 2023-11-17 DIAGNOSIS — R0683 Snoring: Secondary | ICD-10-CM | POA: Diagnosis not present

## 2023-11-17 DIAGNOSIS — J4530 Mild persistent asthma, uncomplicated: Secondary | ICD-10-CM | POA: Diagnosis not present

## 2023-11-17 DIAGNOSIS — M069 Rheumatoid arthritis, unspecified: Secondary | ICD-10-CM | POA: Diagnosis not present

## 2023-11-17 DIAGNOSIS — R0681 Apnea, not elsewhere classified: Secondary | ICD-10-CM | POA: Diagnosis not present

## 2023-11-25 DIAGNOSIS — K769 Liver disease, unspecified: Secondary | ICD-10-CM | POA: Diagnosis not present

## 2023-11-25 DIAGNOSIS — R3121 Asymptomatic microscopic hematuria: Secondary | ICD-10-CM | POA: Diagnosis not present

## 2023-11-25 DIAGNOSIS — K5792 Diverticulitis of intestine, part unspecified, without perforation or abscess without bleeding: Secondary | ICD-10-CM | POA: Diagnosis not present

## 2023-11-25 DIAGNOSIS — K5730 Diverticulosis of large intestine without perforation or abscess without bleeding: Secondary | ICD-10-CM | POA: Diagnosis not present

## 2023-11-25 DIAGNOSIS — R8271 Bacteriuria: Secondary | ICD-10-CM | POA: Diagnosis not present

## 2023-11-25 DIAGNOSIS — D1803 Hemangioma of intra-abdominal structures: Secondary | ICD-10-CM | POA: Diagnosis not present

## 2023-11-28 DIAGNOSIS — M069 Rheumatoid arthritis, unspecified: Secondary | ICD-10-CM | POA: Diagnosis not present

## 2023-11-28 DIAGNOSIS — R632 Polyphagia: Secondary | ICD-10-CM | POA: Diagnosis not present

## 2023-11-28 DIAGNOSIS — Z8639 Personal history of other endocrine, nutritional and metabolic disease: Secondary | ICD-10-CM | POA: Diagnosis not present

## 2023-11-28 DIAGNOSIS — K5792 Diverticulitis of intestine, part unspecified, without perforation or abscess without bleeding: Secondary | ICD-10-CM | POA: Diagnosis not present

## 2023-11-29 NOTE — Telephone Encounter (Signed)
 Review of outside records to be scanned in the chart  2019 pathology Tubular adenomas and polypoid colonic mucosa  2019 colonoscopy Three 2 to 6 mm polyps in the transverse colon/ascending colon/appendiceal orifice removed with cold biopsy forceps resected and retrieved. Diverticulosis in the sigmoid colon/descending colon/hepatic flexure Examined portion of ileum was normal The distal rectum and anal verge are normal on retroflexion  2022 pathology Stomach biopsy showed chronic inactive gastritis with focal intestinal metaplasia complete type Stomach biopsy consistent with fundic gland polyps Esophagus biopsy consistent with squamous mucosa no pathologic changes  2022 EGD Normal upper third of esophagus/middle third of esophagus/lower third of esophagus biopsied. Widely patent Schatzki ring dilated up to 20 mm TTS dilator 3 cm hiatal hernia. Erythematous mucosa in the gastric body and antrum biopsy. A few gastric polyps biopsied.

## 2023-12-01 DIAGNOSIS — G4733 Obstructive sleep apnea (adult) (pediatric): Secondary | ICD-10-CM | POA: Diagnosis not present

## 2023-12-02 DIAGNOSIS — R3121 Asymptomatic microscopic hematuria: Secondary | ICD-10-CM | POA: Diagnosis not present

## 2023-12-06 ENCOUNTER — Encounter: Payer: Self-pay | Admitting: Nurse Practitioner

## 2023-12-06 ENCOUNTER — Ambulatory Visit: Payer: Medicare Other | Admitting: Nurse Practitioner

## 2023-12-06 VITALS — BP 137/81 | HR 102 | Temp 98.6°F | Ht <= 58 in | Wt 148.6 lb

## 2023-12-06 DIAGNOSIS — G4719 Other hypersomnia: Secondary | ICD-10-CM

## 2023-12-06 DIAGNOSIS — R0609 Other forms of dyspnea: Secondary | ICD-10-CM | POA: Diagnosis not present

## 2023-12-06 DIAGNOSIS — J4540 Moderate persistent asthma, uncomplicated: Secondary | ICD-10-CM | POA: Diagnosis not present

## 2023-12-06 DIAGNOSIS — M069 Rheumatoid arthritis, unspecified: Secondary | ICD-10-CM

## 2023-12-06 NOTE — Assessment & Plan Note (Addendum)
 Stable on current regimen. DOE seems progressive in nature and not driven by her asthma. Continue current asthma maintenance regimen. Action plan in place.  Patient Instructions  Continue Dulera 2 puffs Twice daily. Brush tongue and rinse mouth afterwards Continue Albuterol inhaler 2 puffs every 6 hours as needed for shortness of breath or wheezing. Notify if symptoms persist despite rescue inhaler/neb use.  Continue nexium 1 tab daily  Continue spiriva 2 puffs daily with Dulera Continue singulair (montelukast) 1 tab At bedtime for asthma   HRCT chest ordered - someone will contact you for scheduling    Follow up in 6-8 weeks after PFT with Dr. Sherene Sires or Philis Nettle. If symptoms do not improve or worsen, please contact office for sooner follow up or seek emergency care.

## 2023-12-06 NOTE — Patient Instructions (Addendum)
 Continue Dulera 2 puffs Twice daily. Brush tongue and rinse mouth afterwards Continue Albuterol inhaler 2 puffs every 6 hours as needed for shortness of breath or wheezing. Notify if symptoms persist despite rescue inhaler/neb use.  Continue nexium 1 tab daily  Continue spiriva 2 puffs daily with Dulera Continue singulair (montelukast) 1 tab At bedtime for asthma   HRCT chest ordered - someone will contact you for scheduling    Follow up in 6-8 weeks after PFT with Dr. Sherene Sires or Philis Nettle. If symptoms do not improve or worsen, please contact office for sooner follow up or seek emergency care.

## 2023-12-06 NOTE — Progress Notes (Signed)
 @Patient  ID: Kayla [name], female    DOB: 04-10-1966, 58 y.o.   MRN: [id number]  Chief Complaint  Patient presents with   Follow-up    Referring provider: Thana Ates, MD  HPI: 58 year old female, never smoker followed for asthma. She is a patient of Dr. Thurston Hole and last seen in office 10/20/2023 by [facility] NP. Past medical history significant for rheumatoid arthritis, GERD, pituitary microadenoma, Paget's disease, anxiety.   TEST/EVENTS:  09/13/2018 PFT: FVC 63, FEV1 37, 48, TLC 112, DLCOunc 112  11/01/2021 eos 400 03/16/2023 CXR: no acute process  02/22/2022: OV with Dr. Sherene Sires. Stable on Dulera. Follow up 12 months or PRN  09/09/2022: OV with Mackenize Delgadillo NP for acute visit. She developed a cough sometime around Thanksgiving. Has been around numerous people who have been coughing. Did not perform any viral testing. She denies any URI symptoms. Her cough is mostly dry but occasional she gets up tiny amount of yellow sputum. She has been wheezing more and her breathing feels a little more short winded compared to her baseline. She denies fevers, chills, hemoptysis, orthopnea, leg swelling. Eating and drinking well. She is using her [facility] as prescribed. Does not have an albuterol inhaler. She did have an episode with coughing fit and a lot of wheezing yesterday, which as brought on by a friend's perfume. This improved once she got home.   03/16/2023: Ov with Royden Bulman NP for acute visit. She has been having more trouble with shortness of breath recently. Noticed it more since the weather warmed up/over the past few months.  Tends to be worse in the mornings.  She has to use her rescue inhaler, which she was not having to do previously.  She has a rare cough, with minimal sputum production that is clear.  Does not seem to be any worse than her normal.  Not having any chest tightness, wheezing, lower extremity swelling, orthopnea, palpitations.  She is feeling a bit more fatigued.  She does have very loud snoring at  night.  She has been told that she stops breathing when she sleeps at night by friend.  She is not sure if this is anything to do with her current symptoms.  She does not want to have a home sleep study right now.  Will consider it at a later date. She has been taking low dose THC edibles to help manage her chronic pain. She does not smoke marijuana. Hasn't noticed a correlation between the edibles and her dyspnea but wasn't sure this was playing a role.   05/16/2023: OV with Dellie Piasecki NP for follow up but is also having some worsening acute symptoms. She feels like her breathing has been more short over the last few weeks. She's still having a dry cough, which is more frequent. Not producing any sputum. Wheezing is more noticeable. Denies any fevers, chills, hemoptysis, URI symptoms, lower extremity swelling, orthopnea, palpitations. She is not using her rescue inhaler much. She is using her dulera and spiriva, that we started at her last visit. She never started the singulair. She is open to prednisone now. FeNO 28 ppb  06/20/2023: OV with Raygan Skarda NP for follow up. After our last visit, she called the office with persistent, nagging cough. She was treated with z pack. She tells me she's feeling much better. Not 100% back to normal but overall improved. Cough has mostly resolved. Not noticing wheezing. Breathing feels better. She denies any fevers, chills, chest congestion, sinus symptoms. She did not  tolerate the steroids well but did complete them. She is not currently taking the singulair because it makes her eyes dry. She is taking Dulera and Spiriva. No rescue use.   10/20/2023: OV with Dorian Renfro NP for acute visit. She had gone to [place] with her mother and sisters and when she returned, felt like she had a cold. She saw her PCP in early to mid December, who prescribed steroids and a z pack. She then called our office on 12/20 with reports of persistent coughing. COVID test had been negative. She was sent in an  additional prednisone taper and promethazine DM cough syrup. She tells me today that the prednisone didn't seem to make any difference. She still has a cough, which is primarily non-productive. She is having to clear her throat more frequently as well. She did notice last night that her sinuses seemed more congested and she was having some postnasal drainage. She doesn't necessarily feel short winded. Occasional wheeze. She denies fevers, chills, hemoptysis, facial tenderness, headaches, ear pain. She is using throat lozenges, which help some. She took some remaining hycodan cough syrup last night, which seemed to be the only thing that calmed her cough down so she could sleep. She's tried other OTC remedies that haven't seemed to help. She is using her Dulera. Albuterol not making much of a difference. Not doing any sinus rinses/sprays.   12/06/2023: Today - follow up Patient presents today for follow up. Her cough is gone. She feels better from that standpoint. Her breathing feels back to normal. She gets short winded with longer distances and stamina isn't what it use to be. Feels like it's declined over the last few years. She does contribute some of it to her weight. Albuterol doesn't seem to make a difference. She's still using her Symbicort and Spiriva. Taking her allergy regimen. She is being evaluated for sleep apnea at [facility]. Feels fatigued/tired all the time. Low energy. She does snore. No drowsy driving.    Allergies  Allergen Reactions   Prednisone Other (See Comments)    SEVERE PANIC ATTACK   Methotrexate Other (See Comments)   Other Other (See Comments)   Tramadol Nausea Only   Oxycodone Hcl Rash   Sulfa Antibiotics Rash    Immunization History  Administered Date(s) Administered   19-influenza Whole 07/04/2018   Influenza Split 06/30/2010, 09/07/2011, 08/04/2014, 07/05/2015, 06/02/2016, 07/11/2020   Influenza Whole 07/05/2011   Influenza,inj,Quad PF,6+ Mos 06/02/2016, 08/22/2019    Influenza-Unspecified 09/06/2011, 07/04/2014, 08/19/2015, 07/04/2016, 07/04/2017, 07/04/2018, 09/04/2019, 06/18/2022   PFIZER(Purple Top)SARS-COV-2 Vaccination 10/24/2019, 11/14/2019   Pneumococcal Polysaccharide-23 10/05/2007, 08/22/2019   Tdap 09/08/2011    Past Medical History:  Diagnosis Date   Anxiety    Arthritis    Asthma    daily inhaler   Back pain    Carpal tunnel syndrome, right upper limb    Dental crowns present    Depression    GERD (gastroesophageal reflux disease)    Glaucoma    History of kidney stones    Hyperlipidemia    Joint pain    Limited joint range of motion    cervical spine and jaw - due to RA   Pituitary adenoma (HCC)    Rheumatoid arthritis(714.0)    Scalp cyst 10/2016   Sciatica    SOB (shortness of breath)    Swallowing difficulty    Vitamin D deficiency     Tobacco History: Social History   Tobacco Use  Smoking Status Never  Smokeless Tobacco Never  Counseling given: Not Answered   Outpatient Medications Prior to Visit  Medication Sig Dispense Refill   albuterol (VENTOLIN HFA) 108 (90 Base) MCG/ACT inhaler INHALE 2 PUFFS BY MOUTH EVERY 6 HOURS AS NEEDED FOR WHEEZING OR SHORTNESS OF BREATH 8.5 g 2   alendronate (FOSAMAX) 70 MG tablet Take 70 mg by mouth once a week.     celecoxib (CELEBREX) 100 MG capsule Take 100 mg by mouth daily. Take 1 tablet every other day     dorzolamide-timolol (COSOPT) 22.3-6.8 MG/ML ophthalmic solution      fluticasone (FLONASE) 50 MCG/ACT nasal spray Place 2 sprays into both nostrils daily. 18.2 mL 2   HYDROcodone bit-homatropine (HYCODAN) 5-1.5 MG/5ML syrup Take 5 mLs by mouth every 6 (six) hours as needed for cough. 180 mL 0   latanoprost (XALATAN) 0.005 % ophthalmic solution SMARTSIG:In Eye(s)     mometasone-formoterol (DULERA) 200-5 MCG/ACT AERO INHALE TWO PUFFS BY MOUTH TWICE A DAY: EVERY MORNING AND 12 HOURS AFTERWARDS 13 g 11   ORENCIA 125 MG/ML SOSY      Tiotropium Bromide Monohydrate (SPIRIVA  RESPIMAT) 1.25 MCG/ACT AERS Inhale 2 puffs into the lungs daily. 4 g 5   venlafaxine XR (EFFEXOR-XR) 37.5 MG 24 hr capsule Take 37.5 mg by mouth daily.     No facility-administered medications prior to visit.     Review of Systems:   Constitutional: No weight loss or gain, night sweats, fevers, chills, or lassitude. +fatigue (baseline) HEENT: No headaches, difficulty swallowing, tooth/dental problems, or sore throat. No sneezing, itching, ear ache, nasal congestion, post nasal drip CV:  No chest pain, orthopnea, PND, swelling in lower extremities, anasarca, dizziness, palpitations, syncope Resp: +shortness of breath with exertion (baseline). No cough. No wheeze. No hemoptysis. No chest wall deformity GI:  No heartburn, indigestion, abdominal pain, nausea, vomiting, diarrhea GU: No dysuria, change in color of urine, urgency or frequency.   Skin: No rash, lesions, ulcerations MSK:  No increased joint pain or swelling.   Neuro: No dizziness or lightheadedness.  Psych: No depression or anxiety. Mood stable.     Physical Exam:  BP 137/81 (BP Location: Right Arm, Patient Position: Sitting, Cuff Size: Large)   Pulse (!) 102   Temp 98.6 F (37 C) (Oral)   Ht 4\' 10"  (1.473 m)   Wt 148 lb 9.6 oz (67.4 kg)   SpO2 95%   BMI 31.06 kg/m   GEN: Pleasant, interactive, well-appearing; in no acute distress HEENT:  Normocephalic and atraumatic. PERRLA. Sclera white. Nasal turbinates pink, moist and patent bilaterally. No rhinorrhea present. Oropharynx pink and moist, without exudate or edema. No lesions, ulcerations NECK:  Supple w/ fair ROM. No JVD present. Normal carotid impulses w/o bruits. Thyroid symmetrical with no goiter or nodules palpated. No lymphadenopathy.   CV: RRR, no m/r/g, no peripheral edema. Pulses intact, +2 bilaterally. No cyanosis, pallor or clubbing. PULMONARY:  Unlabored, regular breathing. Clear bilaterally A&P w/o wheezes/rales/rhonchi. No accessory muscle use.  GI: BS  present and normoactive. Soft, non-tender to palpation. No organomegaly or masses detected.  MSK: No erythema, warmth or tenderness. Cap refil <2 sec all extrem. Swam neck deformity both hands  Neuro: A/Ox3. No focal deficits noted.   Skin: Warm, no lesions or rashe Psych: Normal affect and behavior. Judgement and thought content appropriate.     Lab Results:  CBC    Component Value Date/Time   WBC 10.3 03/16/2023 1622   RBC 4.30 03/16/2023 1622   HGB 13.7 03/16/2023 1622   HCT  41.5 03/16/2023 1622   PLT 350.0 03/16/2023 1622   MCV 96.7 03/16/2023 1622   MCH 31.7 11/01/2021 1835   MCHC 32.9 03/16/2023 1622   RDW 12.6 03/16/2023 1622   LYMPHSABS 2.6 03/16/2023 1622   MONOABS 0.9 03/16/2023 1622   EOSABS 0.4 03/16/2023 1622   BASOSABS 0.1 03/16/2023 1622    BMET    Component Value Date/Time   NA 140 03/16/2023 1622   NA 143 11/25/2020 0000   K 4.3 03/16/2023 1622   CL 103 03/16/2023 1622   CO2 28 03/16/2023 1622   GLUCOSE 98 03/16/2023 1622   BUN 29 (H) 03/16/2023 1622   BUN 24 (A) 11/25/2020 0000   CREATININE 1.44 (H) 03/16/2023 1622   CALCIUM 9.8 03/16/2023 1622   GFRNONAA >60 11/01/2021 1835    BNP No results found for: "BNP"   Imaging:  No results found.  Administration History     None          Latest Ref Rng & Units 09/13/2018   11:40 AM 07/13/2017   11:43 AM 05/30/2015   10:29 AM  PFT Results  FVC-Pre L 1.83  1.81  1.80   FVC-Predicted Pre % 63  62  60   FVC-Post L 1.84  1.92  1.86   FVC-Predicted Post % 63  65  62   Pre FEV1/FVC % % 47  43  47   Post FEV1/FCV % % 48  44  48   FEV1-Pre L 0.85  0.78  0.85   FEV1-Predicted Pre % 37  33  35   FEV1-Post L 0.88  0.85  0.90   DLCO uncorrected ml/min/mmHg 19.80  20.14  22.33   DLCO UNC% % 112  114  127   DLCO corrected ml/min/mmHg  19.96    DLCO COR %Predicted %  113    DLVA Predicted % 145  148  164   TLC L 4.83  4.71    TLC % Predicted % 112  109    RV % Predicted % 170  175      Lab  Results  Component Value Date   NITRICOXIDE 11 05/01/2018        Assessment & Plan:   Moderate persistent asthma Stable on current regimen. DOE seems progressive in nature and not driven by her asthma. Continue current asthma maintenance regimen. Action plan in place.  Patient Instructions  Continue Dulera 2 puffs Twice daily. Brush tongue and rinse mouth afterwards Continue Albuterol inhaler 2 puffs every 6 hours as needed for shortness of breath or wheezing. Notify if symptoms persist despite rescue inhaler/neb use.  Continue nexium 1 tab daily  Continue spiriva 2 puffs daily with Dulera Continue singulair (montelukast) 1 tab At bedtime for asthma   HRCT chest ordered - someone will contact you for scheduling    Follow up in 6-8 weeks after PFT with Dr. Sherene Sires or Philis Nettle. If symptoms do not improve or worsen, please contact office for sooner follow up or seek emergency care.    DOE (dyspnea on exertion) Likely multifactorial related to deconditioning, obesity, chronic fatigue. Need to rule out RA related ILD. Will obtain HRCT chest and repeat PFT. Healthy weight loss and graded exercises encouraged.   Rheumatoid arthritis (HCC) On Orencia. Follow up with rheumatology as scheduled.   Excessive daytime sleepiness Being evaluated for OSA. Advised to notify if she needs any assistance from [place]. Reviewed risks of untreated OSA. Encouraged treatment with CPAP if appropriate. Safe driving practices  reviewed.      I spent 35 minutes of dedicated to the care of this patient on the date of this encounter to include pre-visit review of records, face-to-face time with the patient discussing conditions above, post visit ordering of testing, clinical documentation with the electronic health record, making appropriate referrals as documented, and communicating necessary findings to members of the patients care team.  Noemi Chapel, NP 12/06/2023  Pt aware and understands NP's role.

## 2023-12-06 NOTE — Assessment & Plan Note (Signed)
 On Orencia. Follow up with rheumatology as scheduled.

## 2023-12-06 NOTE — Assessment & Plan Note (Addendum)
 Being evaluated for OSA. Advised to notify if she needs any assistance from [place]. Reviewed risks of untreated OSA. Encouraged treatment with CPAP if appropriate. Safe driving practices reviewed.

## 2023-12-06 NOTE — Assessment & Plan Note (Signed)
 Likely multifactorial related to deconditioning, obesity, chronic fatigue. Need to rule out RA related ILD. Will obtain HRCT chest and repeat PFT. Healthy weight loss and graded exercises encouraged.

## 2023-12-07 DIAGNOSIS — G4733 Obstructive sleep apnea (adult) (pediatric): Secondary | ICD-10-CM | POA: Diagnosis not present

## 2023-12-07 DIAGNOSIS — M069 Rheumatoid arthritis, unspecified: Secondary | ICD-10-CM | POA: Diagnosis not present

## 2023-12-07 DIAGNOSIS — J4530 Mild persistent asthma, uncomplicated: Secondary | ICD-10-CM | POA: Diagnosis not present

## 2023-12-07 DIAGNOSIS — K219 Gastro-esophageal reflux disease without esophagitis: Secondary | ICD-10-CM | POA: Diagnosis not present

## 2023-12-19 DIAGNOSIS — H4041X1 Glaucoma secondary to eye inflammation, right eye, mild stage: Secondary | ICD-10-CM | POA: Diagnosis not present

## 2023-12-19 DIAGNOSIS — H4042X3 Glaucoma secondary to eye inflammation, left eye, severe stage: Secondary | ICD-10-CM | POA: Diagnosis not present

## 2023-12-21 ENCOUNTER — Other Ambulatory Visit: Payer: Self-pay | Admitting: Internal Medicine

## 2023-12-21 DIAGNOSIS — Z1231 Encounter for screening mammogram for malignant neoplasm of breast: Secondary | ICD-10-CM

## 2023-12-27 ENCOUNTER — Ambulatory Visit
Admission: RE | Admit: 2023-12-27 | Discharge: 2023-12-27 | Disposition: A | Discharge status: Home / Self Care | Source: Ambulatory Visit | Attending: Nurse Practitioner

## 2023-12-27 ENCOUNTER — Other Ambulatory Visit (HOSPITAL_BASED_OUTPATIENT_CLINIC_OR_DEPARTMENT_OTHER): Payer: Self-pay | Admitting: Internal Medicine

## 2023-12-27 DIAGNOSIS — E7800 Pure hypercholesterolemia, unspecified: Secondary | ICD-10-CM | POA: Diagnosis not present

## 2023-12-27 DIAGNOSIS — R5383 Other fatigue: Secondary | ICD-10-CM | POA: Diagnosis not present

## 2023-12-27 DIAGNOSIS — R632 Polyphagia: Secondary | ICD-10-CM | POA: Diagnosis not present

## 2023-12-27 DIAGNOSIS — J479 Bronchiectasis, uncomplicated: Secondary | ICD-10-CM | POA: Diagnosis not present

## 2023-12-27 DIAGNOSIS — M069 Rheumatoid arthritis, unspecified: Secondary | ICD-10-CM

## 2023-12-27 DIAGNOSIS — R0609 Other forms of dyspnea: Secondary | ICD-10-CM

## 2023-12-27 DIAGNOSIS — E559 Vitamin D deficiency, unspecified: Secondary | ICD-10-CM | POA: Diagnosis not present

## 2023-12-27 DIAGNOSIS — R911 Solitary pulmonary nodule: Secondary | ICD-10-CM | POA: Diagnosis not present

## 2023-12-27 DIAGNOSIS — D352 Benign neoplasm of pituitary gland: Secondary | ICD-10-CM | POA: Diagnosis not present

## 2023-12-27 DIAGNOSIS — Z0000 Encounter for general adult medical examination without abnormal findings: Secondary | ICD-10-CM | POA: Diagnosis not present

## 2023-12-27 DIAGNOSIS — Z79899 Other long term (current) drug therapy: Secondary | ICD-10-CM | POA: Diagnosis not present

## 2023-12-27 DIAGNOSIS — K219 Gastro-esophageal reflux disease without esophagitis: Secondary | ICD-10-CM | POA: Diagnosis not present

## 2023-12-27 DIAGNOSIS — M810 Age-related osteoporosis without current pathological fracture: Secondary | ICD-10-CM | POA: Diagnosis not present

## 2023-12-27 DIAGNOSIS — J4540 Moderate persistent asthma, uncomplicated: Secondary | ICD-10-CM | POA: Diagnosis not present

## 2023-12-30 ENCOUNTER — Ambulatory Visit (HOSPITAL_COMMUNITY)
Admission: RE | Admit: 2023-12-30 | Discharge: 2023-12-30 | Disposition: A | Discharge status: Home / Self Care | Payer: Self-pay | Source: Ambulatory Visit | Attending: Internal Medicine | Admitting: Internal Medicine

## 2023-12-30 DIAGNOSIS — E7800 Pure hypercholesterolemia, unspecified: Secondary | ICD-10-CM | POA: Insufficient documentation

## 2024-01-02 DIAGNOSIS — G4733 Obstructive sleep apnea (adult) (pediatric): Secondary | ICD-10-CM | POA: Diagnosis not present

## 2024-01-02 DIAGNOSIS — H539 Unspecified visual disturbance: Secondary | ICD-10-CM | POA: Diagnosis not present

## 2024-01-02 DIAGNOSIS — M069 Rheumatoid arthritis, unspecified: Secondary | ICD-10-CM | POA: Diagnosis not present

## 2024-01-02 DIAGNOSIS — Z8639 Personal history of other endocrine, nutritional and metabolic disease: Secondary | ICD-10-CM | POA: Diagnosis not present

## 2024-01-04 DIAGNOSIS — G4733 Obstructive sleep apnea (adult) (pediatric): Secondary | ICD-10-CM | POA: Diagnosis not present

## 2024-01-11 ENCOUNTER — Ambulatory Visit: Payer: Medicare Other | Admitting: Gastroenterology

## 2024-01-16 ENCOUNTER — Ambulatory Visit
Admission: RE | Admit: 2024-01-16 | Discharge: 2024-01-16 | Disposition: A | Discharge status: Home / Self Care | Source: Ambulatory Visit | Attending: Internal Medicine | Admitting: Internal Medicine

## 2024-01-16 DIAGNOSIS — Z1231 Encounter for screening mammogram for malignant neoplasm of breast: Secondary | ICD-10-CM | POA: Diagnosis not present

## 2024-01-26 ENCOUNTER — Ambulatory Visit: Admitting: Gastroenterology

## 2024-01-26 ENCOUNTER — Encounter: Payer: Self-pay | Admitting: Gastroenterology

## 2024-01-26 ENCOUNTER — Telehealth: Payer: Self-pay | Admitting: *Deleted

## 2024-01-26 VITALS — BP 112/72 | HR 87 | Ht <= 58 in | Wt 144.0 lb

## 2024-01-26 DIAGNOSIS — Z09 Encounter for follow-up examination after completed treatment for conditions other than malignant neoplasm: Secondary | ICD-10-CM

## 2024-01-26 DIAGNOSIS — Z8719 Personal history of other diseases of the digestive system: Secondary | ICD-10-CM

## 2024-01-26 DIAGNOSIS — Z860101 Personal history of adenomatous and serrated colon polyps: Secondary | ICD-10-CM

## 2024-01-26 DIAGNOSIS — Z860100 Personal history of colon polyps, unspecified: Secondary | ICD-10-CM

## 2024-01-26 MED ORDER — NA SULFATE-K SULFATE-MG SULF 17.5-3.13-1.6 GM/177ML PO SOLN: 1.0000 | Freq: Once | ORAL | 0 refills | Status: AC

## 2024-01-26 NOTE — Telephone Encounter (Signed)
-----   Message from Clinton D. Zehr sent at 01/26/2024 12:38 PM EDT ----- Please let the patient know that Dr. Brice Campi would like her to have a repeat EGD at the time of her colonoscopy as well due to some changes in the gastric lining on her biopsies from her EGD in 2022.  Please see if we can schedule that with her colonoscopy.  Thank you,  Jess ----- Message ----- From: Normie Becton., MD Sent: 01/26/2024  12:16 PM EDT To: Cortez Dines, PA-C     ----- Message ----- From: Zehr, Jessica D, PA-C Sent: 01/26/2024   9:16 AM EDT To: Normie Becton., MD

## 2024-01-26 NOTE — Patient Instructions (Signed)
 You have been scheduled for a colonoscopy. Please follow written instructions given to you at your visit today.   If you use inhalers (even only as needed), please bring them with you on the day of your procedure.  DO NOT TAKE 7 DAYS PRIOR TO TEST- Trulicity (dulaglutide) Ozempic, Wegovy (semaglutide) Mounjaro (tirzepatide) Bydureon Bcise (exanatide extended release)  DO NOT TAKE 1 DAY PRIOR TO YOUR TEST Rybelsus (semaglutide) Adlyxin (lixisenatide) Victoza (liraglutide) Byetta (exanatide) ______________________________________________________________________  _______________________________________________________  If your blood pressure at your visit was 140/90 or greater, please contact your primary care physician to follow up on this.  _______________________________________________________  If you are age 16 or older, your body mass index should be between 23-30. Your Body mass index is 30.1 kg/m. If this is out of the aforementioned range listed, please consider follow up with your Primary Care Provider.  If you are age 69 or younger, your body mass index should be between 19-25. Your Body mass index is 30.1 kg/m. If this is out of the aformentioned range listed, please consider follow up with your Primary Care Provider.   ________________________________________________________  The  GI providers would like to encourage you to use MYCHART to communicate with providers for non-urgent requests or questions.  Due to long hold times on the telephone, sending your provider a message by [facility] may be a faster and more efficient way to get a response.  Please allow 48 business hours for a response.  Please remember that this is for non-urgent requests.  _______________________________________________________

## 2024-01-26 NOTE — Telephone Encounter (Signed)
 EGD added to 03/13/24. Patient informed and agreed with plan.

## 2024-01-26 NOTE — Progress Notes (Signed)
 01/26/2024 Kayla [name] [id number] Mar 23, 1966   HISTORY OF PRESENT ILLNESS: This is a 58 year old female who is new to our office.  She is transferring her care from [facility].  She had a colonoscopy there in 2019 with 3 tubular adenomas removed.  Also diverticulosis.  She returned for colonoscopy there within the past year and unfortunately had a bad experience.  The procedure was not performed.  She is now here to discuss and schedule colonoscopy.  Was excepted by Dr. Brice Campi.  Tells me that she had an episode of diverticulitis within the past few months.  Was seen on CT scan at [facility] urology.  Was treated with antibiotics, But cannot recall which medications.  Pain resolved.  Says that she moves her bowels well.  No significant issues or concerns there.  No rectal bleeding.  We were able to access the CT scan results.  Was from February 2025 CT abdomen and pelvis without contrast.  She had left-sided colonic diverticulosis with mild stranding in the sigmoid mesentery which is new from prior, suggestive of mild acute uncomplicated diverticulitis.  2019 pathology Tubular adenomas and polypoid colonic mucosa   2019 colonoscopy Three 2 to 6 mm polyps in the transverse colon/ascending colon/appendiceal orifice removed with cold biopsy forceps resected and retrieved. 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 Widely patent Schatzki ring dilated up to 20 mm TTS dilator 3 cm hiatal hernia. Erythematous mucosa in the gastric body  and antrum biopsy. A few gastric polyps biopsied.   Past Medical History:  Diagnosis Date   Anxiety    Arthritis    Asthma    daily inhaler   Back pain    Carpal tunnel syndrome, right upper limb    Dental crowns present    Depression    GERD (gastroesophageal reflux disease)    Glaucoma    History of kidney stones    Hyperlipidemia    Joint pain    Limited joint range of motion    cervical spine and jaw - due to RA   Pituitary adenoma (HCC)    Rheumatoid arthritis(714.0)    Scalp cyst 10/2016   Sciatica    SOB (shortness of breath)    Swallowing difficulty    Vitamin D deficiency    Past Surgical History:  Procedure Laterality Date   CARPAL TUNNEL RELEASE     CATARACT EXTRACTION Bilateral    as a child   CATARACT EXTRACTION Left    as a child   CESAREAN SECTION  01/01/2004   CYSTOSCOPY WITH RETROGRADE PYELOGRAM, URETEROSCOPY AND STENT PLACEMENT Right 08/28/2003   ESOPHAGEAL MANOMETRY  04/28/2004   ESOPHAGOGASTRODUODENOSCOPY  06/05/2015   HIP CLOSED REDUCTION Right 04/17/2008   dislocated total hip arthroplasty   HIP CLOSED REDUCTION Right 11/01/2021   Procedure: CLOSED REDUCTION HIP;  Surgeon: Hardy Lia, MD;  Location: [facility];  Service: Orthopedics;  Laterality: Right;   MASS EXCISION Right 10/13/2016   Procedure: EXCISION RIGHT SCALP BONE MASS;  Surgeon: Lindaann Requena Dillingham, DO;  Location: [facility];  Service: Plastics;  Laterality: Right;   REVISION TOTAL KNEE  ARTHROPLASTY Right 08/15/2013   REVISION TOTAL KNEE ARTHROPLASTY Left    TOTAL HIP ARTHROPLASTY Right    TOTAL KNEE ARTHROPLASTY Right    TOTAL KNEE ARTHROPLASTY Left     reports that she has never smoked. She has never used smokeless tobacco. She reports current alcohol use of about 2.0 standard drinks of alcohol per week. She reports that she does not use drugs. family history includes Alcoholism in her father; Anxiety disorder in her mother; Cancer in her mother; Depression in her  mother; Hyperlipidemia in her mother; Kidney Stones in her father; Sleep apnea in her father; Thyroid  disease in her father and mother. Allergies  Allergen Reactions   Prednisone  Other (See Comments)    SEVERE PANIC ATTACK   Methotrexate Other (See Comments)   Other Other (See Comments)   Tramadol Nausea Only   Oxycodone  Hcl Rash   Sulfa Antibiotics Rash      Outpatient Encounter Medications as of 01/26/2024  Medication Sig   albuterol  (VENTOLIN  HFA) 108 (90 Base) MCG/ACT inhaler INHALE 2 PUFFS BY MOUTH EVERY 6 HOURS AS NEEDED FOR WHEEZING OR SHORTNESS OF BREATH   alendronate (FOSAMAX) 70 MG tablet Take 70 mg by mouth once a week.   celecoxib (CELEBREX) 100 MG capsule Take 100 mg by mouth daily. Take 1 tablet every other day   dorzolamide-timolol (COSOPT) 22.3-6.8 MG/ML ophthalmic solution    fluticasone  (FLONASE ) 50 MCG/ACT nasal spray Place 2 sprays into both nostrils daily.   HYDROcodone  bit-homatropine (HYCODAN) 5-1.5 MG/5ML syrup Take 5 mLs by mouth every 6 (six) hours as needed for cough.   latanoprost (XALATAN) 0.005 % ophthalmic solution SMARTSIG:In Eye(s)   mometasone -formoterol  (DULERA ) 200-5 MCG/ACT AERO INHALE TWO PUFFS BY MOUTH TWICE A DAY: EVERY MORNING AND 12 HOURS AFTERWARDS   ORENCIA 125 MG/ML SOSY    Tiotropium Bromide Monohydrate  (SPIRIVA  RESPIMAT) 1.25 MCG/ACT AERS Inhale 2 puffs into the lungs daily.   tirzepatide (ZEPBOUND) 2.5 MG/0.5ML Pen Inject 2.5 mg into the skin once a week.   venlafaxine XR (EFFEXOR-XR) 37.5 MG 24 hr capsule Take 37.5 mg by mouth daily.   No facility-administered encounter medications on file as of 01/26/2024.    REVIEW OF SYSTEMS  : All other systems reviewed and negative except where noted in the History of Present Illness.   PHYSICAL EXAM: BP 112/72   Pulse 87   Ht 4\' 10"  (1.473 m)   Wt 144 lb (65.3 kg)   BMI 30.10 kg/m  General: Well developed white female in no acute distress Head: Normocephalic and atraumatic Eyes:  Sclerae  anicteric, conjunctiva pink. Ears: Normal auditory acuity Lungs: Clear throughout to auscultation; no W/R/R. Heart: Regular rate and rhythm; no M/R/G. Rectal:  Will be done at the time of colonoscopy. Musculoskeletal: Symmetrical with no gross deformities  Skin: No lesions on visible extremities Extremities: No edema.  Scars noted from previous TKAs. Neurological: Alert oriented x 4, grossly non-focal Psychological:  Alert and cooperative. Normal mood and affect  ASSESSMENT AND PLAN: *Personal history of colon polyps:  3 TAs removed in 2019.  Went for a repeat colonoscopy with Dr. Feliberto Hopping within the past year, but had a bad experience, procedure not performed..  Of note:  Is somewhat of a hard stick.  Needs repeat colonoscopy.  Accepted by Dr. Brice Campi.  Will schedule.  The risks, benefits, and alternatives to colonoscopy were discussed with the patient and she consents to proceed.  *Diverticulitis: Had an episode of diverticulitis, first episode within the past few months.  Seen on CT scan at urology 11/2023.  Was treated with antibiotics successfully.   CC:  Tena Feeling, MD

## 2024-01-26 NOTE — Progress Notes (Signed)
 Attending Physician's Attestation   I have reviewed the chart.   I agree with the Advanced Practitioner's note, impression, and recommendations with any updates as below. As patient is transitioning from [place] to [place], we can move forward with the planned surveillance colonoscopy.  She also has a history of GIM, so recommend upper endoscopy be performed at same time as colonoscopy that is scheduled.   Yong Henle, MD [facility] Advanced [facility]oscopy Office # [phone]

## 2024-01-27 NOTE — Progress Notes (Signed)
 No evidence of ILD. She does have findings consistent with asthma. She also has some mild btx, which is chronic airway inflammation that can create mucus pockets. She has a 4 mm nodule that we can repeat CT chest in 12 months for given she is not a former smoker. Breast nodule already address with mammogram. Thanks.

## 2024-01-31 ENCOUNTER — Telehealth: Payer: Self-pay | Admitting: Nurse Practitioner

## 2024-01-31 NOTE — Telephone Encounter (Signed)
 Cleo Dace, Mariah Shines, NP  Armen Bernhardt, CMA No evidence of ILD. She does have findings consistent with asthma. She also has some mild btx, which is chronic airway inflammation that can create mucus pockets. She has a 4 mm nodule that we can repeat CT chest in 12 months for given she is not a former smoker. Breast nodule already address with mammogram. Thanks.  Called the pt and there was no answer- LMTCB.

## 2024-01-31 NOTE — Progress Notes (Signed)
 Called the pt and there was no answer- LMTCB

## 2024-02-07 NOTE — Telephone Encounter (Signed)
Pt aware.

## 2024-02-16 DIAGNOSIS — Z8639 Personal history of other endocrine, nutritional and metabolic disease: Secondary | ICD-10-CM | POA: Diagnosis not present

## 2024-02-16 DIAGNOSIS — E559 Vitamin D deficiency, unspecified: Secondary | ICD-10-CM | POA: Diagnosis not present

## 2024-02-16 DIAGNOSIS — G4733 Obstructive sleep apnea (adult) (pediatric): Secondary | ICD-10-CM | POA: Diagnosis not present

## 2024-02-16 DIAGNOSIS — R632 Polyphagia: Secondary | ICD-10-CM | POA: Diagnosis not present

## 2024-02-16 DIAGNOSIS — M069 Rheumatoid arthritis, unspecified: Secondary | ICD-10-CM | POA: Diagnosis not present

## 2024-02-16 DIAGNOSIS — H2010 Chronic iridocyclitis, unspecified eye: Secondary | ICD-10-CM | POA: Diagnosis not present

## 2024-02-20 ENCOUNTER — Ambulatory Visit: Admitting: Nurse Practitioner

## 2024-02-20 ENCOUNTER — Encounter: Payer: Self-pay | Admitting: Nurse Practitioner

## 2024-02-20 ENCOUNTER — Ambulatory Visit (HOSPITAL_BASED_OUTPATIENT_CLINIC_OR_DEPARTMENT_OTHER): Admitting: Internal Medicine

## 2024-02-20 VITALS — BP 137/88 | HR 105 | Ht <= 58 in | Wt 148.8 lb

## 2024-02-20 DIAGNOSIS — E669 Obesity, unspecified: Secondary | ICD-10-CM | POA: Diagnosis not present

## 2024-02-20 DIAGNOSIS — M069 Rheumatoid arthritis, unspecified: Secondary | ICD-10-CM

## 2024-02-20 DIAGNOSIS — R911 Solitary pulmonary nodule: Secondary | ICD-10-CM | POA: Insufficient documentation

## 2024-02-20 DIAGNOSIS — J4540 Moderate persistent asthma, uncomplicated: Secondary | ICD-10-CM

## 2024-02-20 DIAGNOSIS — Z6831 Body mass index (BMI) 31.0-31.9, adult: Secondary | ICD-10-CM

## 2024-02-20 DIAGNOSIS — E66811 Obesity, class 1: Secondary | ICD-10-CM | POA: Insufficient documentation

## 2024-02-20 DIAGNOSIS — R0609 Other forms of dyspnea: Secondary | ICD-10-CM

## 2024-02-20 LAB — PULMONARY FUNCTION TEST
DL/VA % pred: 125 %
DL/VA: 5.52 ml/min/mmHg/L
DLCO cor % pred: 115 %
DLCO cor: 19.81 ml/min/mmHg
DLCO unc % pred: 115 %
DLCO unc: 19.81 ml/min/mmHg
FEF 25-75 Post: 0.43 L/s
FEF 25-75 Pre: 0.35 L/s
FEF2575-%Change-Post: 24 %
FEF2575-%Pred-Post: 20 %
FEF2575-%Pred-Pre: 16 %
FEV1-%Change-Post: 9 %
FEV1-%Pred-Post: 42 %
FEV1-%Pred-Pre: 39 %
FEV1-Post: 0.92 L
FEV1-Pre: 0.84 L
FEV1FVC-%Change-Post: 6 %
FEV1FVC-%Pred-Pre: 60 %
FEV6-%Change-Post: 4 %
FEV6-%Pred-Post: 67 %
FEV6-%Pred-Pre: 63 %
FEV6-Post: 1.79 L
FEV6-Pre: 1.71 L
FEV6FVC-%Change-Post: 1 %
FEV6FVC-%Pred-Post: 101 %
FEV6FVC-%Pred-Pre: 99 %
FVC-%Change-Post: 3 %
FVC-%Pred-Post: 66 %
FVC-%Pred-Pre: 64 %
FVC-Post: 1.83 L
FVC-Pre: 1.77 L
Post FEV1/FVC ratio: 50 %
Post FEV6/FVC ratio: 98 %
Pre FEV1/FVC ratio: 47 %
Pre FEV6/FVC Ratio: 96 %
RV % pred: 151 %
RV: 2.57 L
TLC % pred: 111 %
TLC: 4.8 L

## 2024-02-20 NOTE — Patient Instructions (Signed)
 Full PFT performed today.

## 2024-02-20 NOTE — Progress Notes (Signed)
 Full PFT performed today.

## 2024-02-20 NOTE — Patient Instructions (Addendum)
 Continue Dulera  2 puffs Twice daily. Brush tongue and rinse mouth afterwards Continue Albuterol  inhaler 2 puffs every 6 hours as needed for shortness of breath or wheezing. Notify if symptoms persist despite rescue inhaler/neb use.  Continue nexium  1 tab daily  Continue spiriva  2 puffs daily with Dulera  Continue singulair  (montelukast ) 1 tab At bedtime for asthma  Repeat CT chest in 6 months   Follow up in 6 months with Dr. Waymond Hailey or Katie Nelida Mandarino,NP. If symptoms do not improve or worsen, please contact office for sooner follow up or seek emergency care.

## 2024-02-20 NOTE — Assessment & Plan Note (Signed)
 RA flare. Managing with rheumatology.

## 2024-02-20 NOTE — Assessment & Plan Note (Signed)
 BMI 31. On GLP-1 with Zepbound. Follow up with PCP as scheduled. Continue healthy weight loss measures

## 2024-02-20 NOTE — Assessment & Plan Note (Signed)
 Small, 4 mm RUL nodule, new since prior. She is a never smoker; however, she is chronically immunosuppressed due to RA, has a history of secondhand exposure and a mother with a hx of lung cancer. Given this, recommend follow up imaging in 6-12 months. Discussed with pt who preferred to air on the side of caution and repeat in 6 months. Orders placed today.

## 2024-02-20 NOTE — Assessment & Plan Note (Signed)
 Asthma with chronic severe obstruction. Overall stable dating back to 2016. No evidence of RA ILD on imaging or restriction on PFT. Continue current maintenance regimen. Trigger prevention reviewed. Action plan in place.  Patient Instructions  Continue Dulera  2 puffs Twice daily. Brush tongue and rinse mouth afterwards Continue Albuterol  inhaler 2 puffs every 6 hours as needed for shortness of breath or wheezing. Notify if symptoms persist despite rescue inhaler/neb use.  Continue nexium  1 tab daily  Continue spiriva  2 puffs daily with Dulera  Continue singulair  (montelukast ) 1 tab At bedtime for asthma  Repeat CT chest in 6 months   Follow up in 6 months with Dr. Waymond Hailey or Katie Gracy Ehly,NP. If symptoms do not improve or worsen, please contact office for sooner follow up or seek emergency care.

## 2024-02-20 NOTE — Progress Notes (Signed)
 @Patient  ID: Kayla [name], female    DOB: [birth year], 58 y.o.   MRN: [id number]  Chief Complaint  Patient presents with   Follow-up    Patient states she is stable.    Referring provider: Tena Feeling, MD  HPI: 58 year old female, never smoker followed for asthma. She is a patient of Dr. Jacqui Mau and last seen in office 12/06/2023 by [facility] NP. Past medical history significant for rheumatoid arthritis, GERD, pituitary microadenoma, Paget's disease, anxiety.   TEST/EVENTS:  09/13/2018 PFT: FVC 63, FEV1 37, 48, TLC 112, DLCOunc 112  11/01/2021 eos 400 03/16/2023 CXR: no acute process 12/27/2023 HRCT chest: nodular lesion in right breast. Mild mosaic pulmonary parenchymal with air trapping. Mild cylindrical btx. Calcified granulomas. Scattered mucoid impaction. 4 mm RUL nodule. Minimally hypodense mass in the left hepatic lobe measures 4.2 x 6.1 cm. Several b/l renal stones.  02/20/2024 PFT: FVC 64, FEV1 39, ratio 50, TLC 111, DLCOcor 115  02/22/2022: OV with Dr. Waymond Hailey. Stable on Dulera . Follow up 12 months or PRN  09/09/2022: OV with Holland Nickson NP for acute visit. She developed a cough sometime around Thanksgiving. Has been around numerous people who have been coughing. Did not perform any viral testing. She denies any URI symptoms. Her cough is mostly dry but occasional she gets up tiny amount of yellow sputum. She has been wheezing more and her breathing feels a little more short winded compared to her baseline. She denies fevers, chills, hemoptysis, orthopnea, leg swelling. Eating and drinking well. She is using her Dulera  as prescribed. Does not have an albuterol  inhaler. She did have an episode with coughing fit and a lot of wheezing yesterday, which as brought on by a friend's perfume. This improved once she got home.   03/16/2023: Ov with Kena Limon NP for acute visit. She has been having more trouble with shortness of breath recently. Noticed it more since the weather warmed up/over the past few months.   Tends to be worse in the mornings.  She has to use her rescue inhaler, which she was not having to do previously.  She has a rare cough, with minimal sputum production that is clear.  Does not seem to be any worse than her normal.  Not having any chest tightness, wheezing, lower extremity swelling, orthopnea, palpitations.  She is feeling a bit more fatigued.  She does have very loud snoring at night.  She has been told that she stops breathing when she sleeps at night by friend.  She is not sure if this is anything to do with her current symptoms.  She does not want to have a home sleep study right now.  Will consider it at a later date. She has been taking low dose THC edibles to help manage her chronic pain. She does not smoke marijuana. Hasn't noticed a correlation between the edibles and her dyspnea but wasn't sure this was playing a role.   05/16/2023: OV with Elyanah Farino NP for follow up but is also having some worsening acute symptoms. She feels like her breathing has been more short over the last few weeks. She's still having a dry cough, which is more frequent. Not producing any sputum. Wheezing is more noticeable. Denies any fevers, chills, hemoptysis, URI symptoms, lower extremity swelling, orthopnea, palpitations. She is not using her rescue inhaler much. She is using her dulera  and spiriva , that we started at her last visit. She never started the singulair . She is open to prednisone  now. FeNO 28  ppb  06/20/2023: OV with Tayonna Bacha NP for follow up. After our last visit, she called the office with persistent, nagging cough. She was treated with z pack. She tells me she's feeling much better. Not 100% back to normal but overall improved. Cough has mostly resolved. Not noticing wheezing. Breathing feels better. She denies any fevers, chills, chest congestion, sinus symptoms. She did not tolerate the steroids well but did complete them. She is not currently taking the singulair  because it makes her eyes dry. She is  taking Dulera  and Spiriva . No rescue use.   10/20/2023: OV with Ebbie Sorenson NP for acute visit. She had gone to [place] with her mother and sisters and when she returned, felt like she had a cold. She saw her PCP in early to mid December, who prescribed steroids and a z pack. She then called our office on 12/20 with reports of persistent coughing. COVID test had been negative. She was sent in an additional prednisone  taper and promethazine  DM cough syrup. She tells me today that the prednisone  didn't seem to make any difference. She still has a cough, which is primarily non-productive. She is having to clear her throat more frequently as well. She did notice last night that her sinuses seemed more congested and she was having some postnasal drainage. She doesn't necessarily feel short winded. Occasional wheeze. She denies fevers, chills, hemoptysis, facial tenderness, headaches, ear pain. She is using throat lozenges, which help some. She took some remaining hycodan cough syrup last night, which seemed to be the only thing that calmed her cough down so she could sleep. She's tried other OTC remedies that haven't seemed to help. She is using her Dulera . Albuterol  not making much of a difference. Not doing any sinus rinses/sprays.   12/06/2023: Ov with Hannan Hutmacher NP for follow up. Her cough is gone. She feels better from that standpoint. Her breathing feels back to normal. She gets short winded with longer distances and stamina isn't what it use to be. Feels like it's declined over the last few years. She does contribute some of it to her weight. Albuterol  doesn't seem to make a difference. She's still using her Symbicort and Spiriva . Taking her allergy regimen. She is being evaluated for sleep apnea at [facility]. Feels fatigued/tired all the time. Low energy. She does snore. No drowsy driving.   0/34/7425: Today - follow up Patient presents today for follow-up after pulmonary function testing.  Her PFT has been stable even dating  back to 8 years ago.  She does not have any evidence of ILD related to her RA on her recent high-resolution CT chest.  Had some mild bronchiectasis, air trapping and a small 4 mm right upper lobe nodule.  Overall, she feels stable compared to her last visit.  Breathing feels back to normal.  Not having any issues with cough or wheezing right now.  Does feel like a lot of her issues with exertion and stamina are related to her weight.  She is starting Zepbound and going to be working on [employer].  Her RA is flaring right now so she is having some difficulties with joint pains.  Also feels little jittery after the albuterol  during her PFT, which is making her heart rate is little bit higher.  Not having any palpitations, chest pain, lightheadedness or dizziness.  Allergies  Allergen Reactions   Prednisone  Other (See Comments)    SEVERE PANIC ATTACK   Methotrexate Other (See Comments)   Other Other (See Comments)  Tramadol Nausea Only   Oxycodone  Hcl Rash   Sulfa Antibiotics Rash    Immunization History  Administered Date(s) Administered   19-influenza Whole 07/04/2018   Fluzone Influenza virus vaccine,trivalent (IIV3), split virus 06/30/2010, 07/05/2015, 06/02/2016, 07/11/2020   Influenza Split 09/07/2011, 08/04/2014   Influenza Whole 07/05/2011   Influenza,inj,Quad PF,6+ Mos 06/02/2016, 08/22/2019   Influenza-Unspecified 09/06/2011, 07/04/2014, 08/19/2015, 07/04/2016, 07/04/2017, 07/04/2018, 09/04/2019, 06/18/2022   PFIZER(Purple Top)SARS-COV-2 Vaccination 10/24/2019, 11/14/2019   Pneumococcal Polysaccharide-23 10/05/2007, 08/22/2019   Tdap 09/08/2011    Past Medical History:  Diagnosis Date   Anxiety    Arthritis    Asthma    daily inhaler   Back pain    Carpal tunnel syndrome, right upper limb    Dental crowns present    Depression    GERD (gastroesophageal reflux disease)    Glaucoma    History of kidney stones    Hyperlipidemia    Joint pain    Limited joint  range of motion    cervical spine and jaw - due to RA   Pituitary adenoma (HCC)    Rheumatoid arthritis(714.0)    Scalp cyst 10/2016   Sciatica    SOB (shortness of breath)    Swallowing difficulty    Vitamin D deficiency     Tobacco History: Social History   Tobacco Use  Smoking Status Never  Smokeless Tobacco Never   Counseling given: Not Answered   Outpatient Medications Prior to Visit  Medication Sig Dispense Refill   albuterol  (VENTOLIN  HFA) 108 (90 Base) MCG/ACT inhaler INHALE 2 PUFFS BY MOUTH EVERY 6 HOURS AS NEEDED FOR WHEEZING OR SHORTNESS OF BREATH 8.5 g 2   alendronate (FOSAMAX) 70 MG tablet Take 70 mg by mouth once a week.     celecoxib (CELEBREX) 100 MG capsule Take 100 mg by mouth daily. Take 1 tablet every other day     dorzolamide-timolol (COSOPT) 22.3-6.8 MG/ML ophthalmic solution      fluticasone  (FLONASE ) 50 MCG/ACT nasal spray Place 2 sprays into both nostrils daily. 18.2 mL 2   latanoprost (XALATAN) 0.005 % ophthalmic solution SMARTSIG:In Eye(s)     mometasone -formoterol  (DULERA ) 200-5 MCG/ACT AERO INHALE TWO PUFFS BY MOUTH TWICE A DAY: EVERY MORNING AND 12 HOURS AFTERWARDS 13 g 11   ORENCIA 125 MG/ML SOSY      Tiotropium Bromide Monohydrate  (SPIRIVA  RESPIMAT) 1.25 MCG/ACT AERS Inhale 2 puffs into the lungs daily. 4 g 5   tirzepatide (ZEPBOUND) 2.5 MG/0.5ML Pen Inject 2.5 mg into the skin once a week.     venlafaxine XR (EFFEXOR-XR) 37.5 MG 24 hr capsule Take 37.5 mg by mouth daily.     HYDROcodone  bit-homatropine (HYCODAN) 5-1.5 MG/5ML syrup Take 5 mLs by mouth every 6 (six) hours as needed for cough. (Patient not taking: Reported on 02/20/2024) 180 mL 0   No facility-administered medications prior to visit.     Review of Systems:   Constitutional: No weight loss or gain, night sweats, fevers, chills, or lassitude. +fatigue (baseline) HEENT: No headaches, difficulty swallowing, tooth/dental problems, or sore throat. No sneezing, itching, ear ache, nasal  congestion, post nasal drip CV:  No chest pain, orthopnea, PND, swelling in lower extremities, anasarca, dizziness, palpitations, syncope Resp: +shortness of breath with exertion (baseline). No cough. No wheeze. No hemoptysis. No chest wall deformity GI:  No heartburn, indigestion, abdominal pain, nausea, vomiting, diarrhea GU: No dysuria, change in color of urine, urgency or frequency.   Skin: No rash, lesions, ulcerations MSK:  +joint pains  Neuro: No dizziness or lightheadedness.  Psych: No depression or anxiety. Mood stable.     Physical Exam:  BP 137/88 (BP Location: Right Arm, Patient Position: Sitting)   Pulse (!) 105   Ht 4\' 10"  (1.473 m)   Wt 148 lb 12.8 oz (67.5 kg)   SpO2 97%   BMI 31.10 kg/m   GEN: Pleasant, interactive, well-appearing; in no acute distress HEENT:  Normocephalic and atraumatic. PERRLA. Sclera white. Nasal turbinates pink, moist and patent bilaterally. No rhinorrhea present. Oropharynx pink and moist, without exudate or edema. No lesions, ulcerations NECK:  Supple w/ fair ROM. Thyroid  symmetrical with no goiter or nodules palpated. No lymphadenopathy.   CV: RRR, no m/r/g, no peripheral edema. Pulses intact, +2 bilaterally. No cyanosis, pallor or clubbing. PULMONARY:  Unlabored, regular breathing. Clear bilaterally A&P w/o wheezes/rales/rhonchi. No accessory muscle use.  GI: BS present and normoactive. Soft, non-tender to palpation. No organomegaly or masses detected.  MSK: No erythema, warmth or tenderness. Cap refil <2 sec all extrem. Swam neck deformity both hands  Neuro: A/Ox3. No focal deficits noted.   Skin: Warm, no lesions or rashe Psych: Normal affect and behavior. Judgement and thought content appropriate.     Lab Results:  CBC    Component Value Date/Time   WBC 10.3 03/16/2023 1622   RBC 4.30 03/16/2023 1622   HGB 13.7 03/16/2023 1622   HCT 41.5 03/16/2023 1622   PLT 350.0 03/16/2023 1622   MCV 96.7 03/16/2023 1622   MCH 31.7  11/01/2021 1835   MCHC 32.9 03/16/2023 1622   RDW 12.6 03/16/2023 1622   LYMPHSABS 2.6 03/16/2023 1622   MONOABS 0.9 03/16/2023 1622   EOSABS 0.4 03/16/2023 1622   BASOSABS 0.1 03/16/2023 1622    BMET    Component Value Date/Time   NA 140 03/16/2023 1622   NA 143 11/25/2020 0000   K 4.3 03/16/2023 1622   CL 103 03/16/2023 1622   CO2 28 03/16/2023 1622   GLUCOSE 98 03/16/2023 1622   BUN 29 (H) 03/16/2023 1622   BUN 24 (A) 11/25/2020 0000   CREATININE 1.44 (H) 03/16/2023 1622   CALCIUM 9.8 03/16/2023 1622   GFRNONAA >60 11/01/2021 1835    BNP No results found for: "BNP"   Imaging:  No results found.  Administration History     None          Latest Ref Rng & Units 02/20/2024    8:29 AM 09/13/2018   11:40 AM 07/13/2017   11:43 AM 05/30/2015   10:29 AM  PFT Results  FVC-Pre L 1.77  P 1.83  1.81  1.80   FVC-Predicted Pre % 64  P 63  62  60   FVC-Post L 1.83  P 1.84  1.92  1.86   FVC-Predicted Post % 66  P 63  65  62   Pre FEV1/FVC % % 47  P 47  43  47   Post FEV1/FCV % % 50  P 48  44  48   FEV1-Pre L 0.84  P 0.85  0.78  0.85   FEV1-Predicted Pre % 39  P 37  33  35   FEV1-Post L 0.92  P 0.88  0.85  0.90   DLCO uncorrected ml/min/mmHg 19.81  P 19.80  20.14  22.33   DLCO UNC% % 115  P 112  114  127   DLCO corrected ml/min/mmHg 19.81  P  19.96    DLCO COR %Predicted % 115  P  113  DLVA Predicted % 125  P 145  148  164   TLC L 4.80  P 4.83  4.71    TLC % Predicted % 111  P 112  109    RV % Predicted % 151  P 170  175      P Preliminary result    Lab Results  Component Value Date   NITRICOXIDE 11 05/01/2018        Assessment & Plan:   Moderate persistent asthma Asthma with chronic severe obstruction. Overall stable dating back to 2016. No evidence of RA ILD on imaging or restriction on PFT. Continue current maintenance regimen. Trigger prevention reviewed. Action plan in place.  Patient Instructions  Continue Dulera  2 puffs Twice daily. Brush  tongue and rinse mouth afterwards Continue Albuterol  inhaler 2 puffs every 6 hours as needed for shortness of breath or wheezing. Notify if symptoms persist despite rescue inhaler/neb use.  Continue nexium  1 tab daily  Continue spiriva  2 puffs daily with Dulera  Continue singulair  (montelukast ) 1 tab At bedtime for asthma  Repeat CT chest in 6 months   Follow up in 6 months with Dr. Waymond Hailey or Katie Ameisha Mcclellan,NP. If symptoms do not improve or worsen, please contact office for sooner follow up or seek emergency care.    Lung nodule Small, 4 mm RUL nodule, new since prior. She is a never smoker; however, she is chronically immunosuppressed due to RA, has a history of secondhand exposure and a mother with a hx of lung cancer. Given this, recommend follow up imaging in 6-12 months. Discussed with pt who preferred to air on the side of caution and repeat in 6 months. Orders placed today.   Rheumatoid arthritis (HCC) RA flare. Managing with rheumatology.   Obesity (BMI 30.0-34.9) BMI 31. On GLP-1 with Zepbound. Follow up with PCP as scheduled. Continue healthy weight loss measures     I spent 35 minutes of dedicated to the care of this patient on the date of this encounter to include pre-visit review of records, face-to-face time with the patient discussing conditions above, post visit ordering of testing, clinical documentation with the electronic health record, making appropriate referrals as documented, and communicating necessary findings to members of the patients care team.  Roetta Clarke, NP 02/20/2024  Pt aware and understands NP's role.

## 2024-03-05 ENCOUNTER — Encounter: Payer: Self-pay | Admitting: Gastroenterology

## 2024-03-13 ENCOUNTER — Encounter: Payer: Self-pay | Admitting: Gastroenterology

## 2024-03-13 ENCOUNTER — Ambulatory Visit (AMBULATORY_SURGERY_CENTER): Admitting: Gastroenterology

## 2024-03-13 VITALS — BP 118/66 | HR 70 | Temp 97.8°F | Resp 15

## 2024-03-13 DIAGNOSIS — K635 Polyp of colon: Secondary | ICD-10-CM

## 2024-03-13 DIAGNOSIS — K449 Diaphragmatic hernia without obstruction or gangrene: Secondary | ICD-10-CM

## 2024-03-13 DIAGNOSIS — K2950 Unspecified chronic gastritis without bleeding: Secondary | ICD-10-CM | POA: Diagnosis not present

## 2024-03-13 DIAGNOSIS — Z1211 Encounter for screening for malignant neoplasm of colon: Secondary | ICD-10-CM

## 2024-03-13 DIAGNOSIS — K5730 Diverticulosis of large intestine without perforation or abscess without bleeding: Secondary | ICD-10-CM

## 2024-03-13 DIAGNOSIS — K222 Esophageal obstruction: Secondary | ICD-10-CM | POA: Diagnosis not present

## 2024-03-13 DIAGNOSIS — K644 Residual hemorrhoidal skin tags: Secondary | ICD-10-CM

## 2024-03-13 DIAGNOSIS — K5140 Inflammatory polyps of colon without complications: Secondary | ICD-10-CM

## 2024-03-13 DIAGNOSIS — D123 Benign neoplasm of transverse colon: Secondary | ICD-10-CM

## 2024-03-13 DIAGNOSIS — K31A0 Gastric intestinal metaplasia, unspecified: Secondary | ICD-10-CM

## 2024-03-13 DIAGNOSIS — K641 Second degree hemorrhoids: Secondary | ICD-10-CM | POA: Diagnosis not present

## 2024-03-13 DIAGNOSIS — K259 Gastric ulcer, unspecified as acute or chronic, without hemorrhage or perforation: Secondary | ICD-10-CM

## 2024-03-13 DIAGNOSIS — K621 Rectal polyp: Secondary | ICD-10-CM | POA: Diagnosis not present

## 2024-03-13 DIAGNOSIS — Z860100 Personal history of colon polyps, unspecified: Secondary | ICD-10-CM

## 2024-03-13 MED ORDER — SODIUM CHLORIDE 0.9 % IV SOLN: 500.0000 mL | Freq: Once | INTRAVENOUS | Status: DC

## 2024-03-13 MED ORDER — DEXLANSOPRAZOLE 30 MG PO CPDR: 30.0000 mg | DELAYED_RELEASE_CAPSULE | Freq: Two times a day (BID) | ORAL | 12 refills | Status: AC

## 2024-03-13 NOTE — Progress Notes (Signed)
 GASTROENTEROLOGY PROCEDURE H&P NOTE   Primary Care Physician: Tena Feeling, MD  HPI: Kayla [name] is a 58 y.o. female who presents for EGD/Colonoscopy for history of GIM and surveillance of prior adenomas.  Past Medical History:  Diagnosis Date   Anxiety    Arthritis    Asthma    daily inhaler   Back pain    Carpal tunnel syndrome, right upper limb    Dental crowns present    Depression    GERD (gastroesophageal reflux disease)    Glaucoma    History of kidney stones    Hyperlipidemia    Joint pain    Limited joint range of motion    cervical spine and jaw - due to RA   Pituitary adenoma (HCC)    Rheumatoid arthritis(714.0)    Scalp cyst 10/2016   Sciatica    SOB (shortness of breath)    Swallowing difficulty    Vitamin D deficiency    Past Surgical History:  Procedure Laterality Date   CARPAL TUNNEL RELEASE     CATARACT EXTRACTION Bilateral    as a child   CATARACT EXTRACTION Left    as a child   CESAREAN SECTION  01/01/2004   CYSTOSCOPY WITH RETROGRADE PYELOGRAM, URETEROSCOPY AND STENT PLACEMENT Right 08/28/2003   ESOPHAGEAL MANOMETRY  04/28/2004   ESOPHAGOGASTRODUODENOSCOPY  06/05/2015   HIP CLOSED REDUCTION Right 04/17/2008   dislocated total hip arthroplasty   HIP CLOSED REDUCTION Right 11/01/2021   Procedure: CLOSED REDUCTION HIP;  Surgeon: Hardy Lia, MD;  Location: [facility];  Service: Orthopedics;  Laterality: Right;   MASS EXCISION Right 10/13/2016   Procedure: EXCISION RIGHT SCALP BONE MASS;  Surgeon: Lindaann Requena Dillingham, DO;  Location: [facility];  Service: Plastics;  Laterality: Right;   REVISION TOTAL KNEE ARTHROPLASTY Right 08/15/2013   REVISION TOTAL KNEE ARTHROPLASTY Left    TOTAL HIP ARTHROPLASTY Right    TOTAL KNEE ARTHROPLASTY Right    TOTAL KNEE ARTHROPLASTY Left    Current Outpatient Medications  Medication Sig Dispense Refill   albuterol  (VENTOLIN  HFA) 108 (90 Base) MCG/ACT inhaler INHALE 2 PUFFS BY MOUTH EVERY 6  HOURS AS NEEDED FOR WHEEZING OR SHORTNESS OF BREATH 8.5 g 2   alendronate (FOSAMAX) 70 MG tablet Take 70 mg by mouth once a week.     brimonidine (ALPHAGAN) 0.2 % ophthalmic solution SMARTSIG:In Eye(s)     celecoxib (CELEBREX) 100 MG capsule Take 100 mg by mouth daily. Take 1 tablet every other day     dorzolamide-timolol (COSOPT) 22.3-6.8 MG/ML ophthalmic solution      fluticasone  (FLONASE ) 50 MCG/ACT nasal spray Place 2 sprays into both nostrils daily. 18.2 mL 2   latanoprost (XALATAN) 0.005 % ophthalmic solution SMARTSIG:In Eye(s)     mometasone -formoterol  (DULERA ) 200-5 MCG/ACT AERO INHALE TWO PUFFS BY MOUTH TWICE A DAY: EVERY MORNING AND 12 HOURS AFTERWARDS 13 g 11   ORENCIA 125 MG/ML SOSY      Tiotropium Bromide Monohydrate  (SPIRIVA  RESPIMAT) 1.25 MCG/ACT AERS Inhale 2 puffs into the lungs daily. 4 g 5   tirzepatide (ZEPBOUND) 2.5 MG/0.5ML Pen Inject 2.5 mg into the skin once a week.     Cholecalciferol 50 MCG (2000 UT) TABS 1 tablet Orally Once a day     dexlansoprazole  (DEXILANT ) 60 MG capsule TAKE 1 CAPSULE BY MOUTH DAILY for 30     HYDROcodone  bit-homatropine (HYCODAN) 5-1.5 MG/5ML syrup Take 5 mLs by mouth every 6 (six) hours as needed for cough. (  Patient not taking: Reported on 03/13/2024) 180 mL 0   venlafaxine XR (EFFEXOR-XR) 75 MG 24 hr capsule 75 mg.     Current Facility-Administered Medications  Medication Dose Route Frequency Provider Last Rate Last Admin   0.9 %  sodium chloride infusion  500 mL Intravenous Once Mansouraty, Christiona Siddique Jr., MD        Current Outpatient Medications:    albuterol  (VENTOLIN  HFA) 108 (90 Base) MCG/ACT inhaler, INHALE 2 PUFFS BY MOUTH EVERY 6 HOURS AS NEEDED FOR WHEEZING OR SHORTNESS OF BREATH, Disp: 8.5 g, Rfl: 2   alendronate (FOSAMAX) 70 MG tablet, Take 70 mg by mouth once a week., Disp: , Rfl:    brimonidine (ALPHAGAN) 0.2 % ophthalmic solution, SMARTSIG:In Eye(s), Disp: , Rfl:    celecoxib (CELEBREX) 100 MG capsule, Take 100 mg by mouth daily.  Take 1 tablet every other day, Disp: , Rfl:    dorzolamide-timolol (COSOPT) 22.3-6.8 MG/ML ophthalmic solution, , Disp: , Rfl:    fluticasone  (FLONASE ) 50 MCG/ACT nasal spray, Place 2 sprays into both nostrils daily., Disp: 18.2 mL, Rfl: 2   latanoprost (XALATAN) 0.005 % ophthalmic solution, SMARTSIG:In Eye(s), Disp: , Rfl:    mometasone -formoterol  (DULERA ) 200-5 MCG/ACT AERO, INHALE TWO PUFFS BY MOUTH TWICE A DAY: EVERY MORNING AND 12 HOURS AFTERWARDS, Disp: 13 g, Rfl: 11   ORENCIA 125 MG/ML SOSY, , Disp: , Rfl:    Tiotropium Bromide Monohydrate  (SPIRIVA  RESPIMAT) 1.25 MCG/ACT AERS, Inhale 2 puffs into the lungs daily., Disp: 4 g, Rfl: 5   tirzepatide (ZEPBOUND) 2.5 MG/0.5ML Pen, Inject 2.5 mg into the skin once a week., Disp: , Rfl:    Cholecalciferol 50 MCG (2000 UT) TABS, 1 tablet Orally Once a day, Disp: , Rfl:    dexlansoprazole  (DEXILANT ) 60 MG capsule, TAKE 1 CAPSULE BY MOUTH DAILY for 30, Disp: , Rfl:    HYDROcodone  bit-homatropine (HYCODAN) 5-1.5 MG/5ML syrup, Take 5 mLs by mouth every 6 (six) hours as needed for cough. (Patient not taking: Reported on 03/13/2024), Disp: 180 mL, Rfl: 0   venlafaxine XR (EFFEXOR-XR) 75 MG 24 hr capsule, 75 mg., Disp: , Rfl:   Current Facility-Administered Medications:    0.9 %  sodium chloride infusion, 500 mL, Intravenous, Once, Mansouraty, Albino Alu., MD Allergies  Allergen Reactions   Methotrexate Other (See Comments)    Lung issues   Prednisone  Other (See Comments)    SEVERE PANIC ATTACK   Other Other (See Comments)   Bromocriptine Other (See Comments)    agittated   Oxycodone  Hcl Rash   Sulfa Antibiotics Rash   Tramadol Nausea Only   Family History  Problem Relation Age of Onset   Hyperlipidemia Mother    Thyroid  disease Mother    Cancer Mother    Depression Mother    Anxiety disorder Mother    Kidney Stones Father    Thyroid  disease Father    Sleep apnea Father    Alcoholism Father    Social History   Socioeconomic History    Marital status: Divorced    Spouse name: Not on file   Number of children: Not on file   Years of education: Not on file   Highest education level: Not on file  Occupational History   Occupation: Licensed Doctor, general practice   Occupation: retired  Tobacco Use   Smoking status: Never   Smokeless tobacco: Never  Vaping Use   Vaping status: Never Used  Substance and Sexual Activity   Alcohol use: Yes  Alcohol/week: 2.0 standard drinks of alcohol    Types: 2 Glasses of wine per week   Drug use: No   Sexual activity: Not Currently  Other Topics Concern   Not on file  Social History Narrative   Not on file   Social Drivers of Health   Financial Resource Strain: Low Risk  (06/08/2022)   Received from [facility], [facility]   Overall Financial Resource Strain (CARDIA)    Difficulty of Paying Living Expenses: Not hard at all  Food Insecurity: No Food Insecurity (06/08/2022)   Received from [facility], [facility]   Hunger Vital Sign    Worried About Running Out of Food in the Last Year: Never true    Ran Out of Food in the Last Year: Never true  Transportation Needs: No Transportation Needs (06/08/2022)   Received from [facility], [facility]   [facility] - Transportation    Lack of Transportation (Medical): No    Lack of Transportation (Non-Medical): No  Physical Activity: Insufficiently Active (06/08/2022)   Received from [facility], [facility]   Exercise Vital Sign    Days of Exercise per Week: 2 days    Minutes of Exercise per Session: 30 min  Stress: No Stress Concern Present (06/08/2022)   Received from [facility], [facility] - Occupational Stress Questionnaire    Feeling of Stress : Only a little  Social Connections: Moderately Integrated (06/08/2022)   Received from [facility], [facility]   Social Connection and Isolation Panel [NHANES]    Frequency of Communication  with Friends and Family: Three times a week    Frequency of Social Gatherings with Friends and Family: Three times a week    Attends Religious Services: 1 to 4 times per year    Active Member of Clubs or Organizations: Yes    Attends Banker Meetings: 1 to 4 times per year    Marital Status: Divorced  Intimate Partner Violence: Not At Risk (06/08/2022)   Received from [facility], [facility]   Humiliation, Afraid, Rape, and Kick questionnaire    Fear of Current or Ex-Partner: No    Emotionally Abused: No    Physically Abused: No    Sexually Abused: No    Physical Exam: Today's Vitals   03/13/24 0721  BP: 107/72  Pulse: 77  Temp: 97.8 F (36.6 C)  SpO2: 94%   There is no height or weight on file to calculate BMI. GEN: NAD EYE: Sclerae anicteric ENT: MMM CV: Non-tachycardic GI: Soft, NT/ND NEURO:  Alert & Oriented x 3  Lab Results: No results for input(s): "WBC", "HGB", "HCT", "PLT" in the last 72 hours. BMET No results for input(s): "NA", "K", "CL", "CO2", "GLUCOSE", "BUN", "CREATININE", "CALCIUM" in the last 72 hours. LFT No results for input(s): "PROT", "ALBUMIN", "AST", "ALT", "ALKPHOS", "BILITOT", "BILIDIR", "IBILI" in the last 72 hours. PT/INR No results for input(s): "LABPROT", "INR" in the last 72 hours.   Impression / Plan: This is a 58 y.o.female who presents for EGD/Colonoscopy for history of GIM and surveillance of prior adenomas.  The risks and benefits of endoscopic evaluation/treatment were discussed with the patient and/or family; these include but are not limited to the risk of perforation, infection, bleeding, missed lesions, lack of diagnosis, severe illness requiring hospitalization, as well as anesthesia and sedation related illnesses.  The patient's history has  been reviewed, patient examined, no change in status, and deemed stable for procedure.  The patient and/or family is agreeable to proceed.    Yong Henle,  MD [facility] Office # [phone]

## 2024-03-13 NOTE — Patient Instructions (Signed)
 Please read handouts provided. Dexilant  30 mg twice daily ( instead of omeprazole ) . Await pathology results. Repeat upper endoscopy in 4 months to check healing. FiberCon 1-2 tablets daily. High Fiber Diet.  Continue present medications. Repeat colonoscopy in 3 years.  YOU HAD AN ENDOSCOPIC PROCEDURE TODAY AT THE [facility]:   Refer to the procedure report that was given to you for any specific questions about what was found during the examination.  If the procedure report does not answer your questions, please call your gastroenterologist to clarify.  If you requested that your care partner not be given the details of your procedure findings, then the procedure report has been included in a sealed envelope for you to review at your convenience later.  YOU SHOULD EXPECT: Some feelings of bloating in the abdomen. Passage of more gas than usual.  Walking can help get rid of the air that was put into your GI tract during the procedure and reduce the bloating. If you had a lower endoscopy (such as a colonoscopy or flexible sigmoidoscopy) you may notice spotting of blood in your stool or on the toilet paper. If you underwent a bowel prep for your procedure, you may not have a normal bowel movement for a few days.  Please Note:  You might notice some irritation and congestion in your nose or some drainage.  This is from the oxygen used during your procedure.  There is no need for concern and it should clear up in a day or so.  SYMPTOMS TO REPORT IMMEDIATELY:  Following lower endoscopy (colonoscopy or flexible sigmoidoscopy):  Excessive amounts of blood in the stool  Significant tenderness or worsening of abdominal pains  Swelling of the abdomen that is new, acute  Fever of 100F or higher  Following upper endoscopy (EGD)  Vomiting of blood or coffee ground material  New chest pain or pain under the shoulder blades  Painful or persistently difficult swallowing  New shortness of  breath  Fever of 100F or higher  Black, tarry-looking stools  For urgent or emergent issues, a gastroenterologist can be reached at any hour by calling [phone]. Do not use MyChart messaging for urgent concerns.    DIET:  We do recommend a small meal at first, but then you may proceed to your regular diet.  Drink plenty of fluids but you should avoid alcoholic beverages for 24 hours.  ACTIVITY:  You should plan to take it easy for the rest of today and you should NOT DRIVE or use heavy machinery until tomorrow (because of the sedation medicines used during the test).    FOLLOW UP: Our staff will call the number listed on your records the next business day following your procedure.  We will call around 7:15- 8:00 am to check on you and address any questions or concerns that you may have regarding the information given to you following your procedure. If we do not reach you, we will leave a message.     If any biopsies were taken you will be contacted by phone or by letter within the next 1-3 weeks.  Please call us  at [phone] if you have not heard about the biopsies in 3 weeks.    SIGNATURES/CONFIDENTIALITY: You and/or your care partner have signed paperwork which will be entered into your electronic medical record.  These signatures attest to the fact that that the information above on your After Visit Summary has been reviewed and is understood.  Full responsibility  of the confidentiality of this discharge information lies with you and/or your care-partner.

## 2024-03-13 NOTE — Op Note (Signed)
 [facility] Patient Name: Kayla [name] Procedure Date: 03/13/2024 8:00 AM MRN: [id number] Endoscopist: Yong Henle , MD, [id number] Age: 58 Referring MD:  Date of Birth: 04/04/1966 Gender: Female Account #: [id number] Procedure:                Upper GI endoscopy Indications:              Follow-up of intestinal metaplasia Medicines:                Monitored Anesthesia Care Procedure:                Pre-Anesthesia Assessment:                           - Prior to the procedure, a History and Physical                            was performed, and patient medications and                            allergies were reviewed. The patient's tolerance of                            previous anesthesia was also reviewed. The risks                            and benefits of the procedure and the sedation                            options and risks were discussed with the patient.                            All questions were answered, and informed consent                            was obtained. Prior Anticoagulants: The patient has                            taken no anticoagulant or antiplatelet agents. ASA                            Grade Assessment: III - A patient with severe                            systemic disease. After reviewing the risks and                            benefits, the patient was deemed in satisfactory                            condition to undergo the procedure.                           After obtaining informed consent, the endoscope was  passed under direct vision. Throughout the                            procedure, the patient's blood pressure, pulse, and                            oxygen saturations were monitored continuously. The                            GIF [id number] #[id number] was introduced through the                            mouth, and advanced to the second part of duodenum.                            The upper GI  endoscopy was accomplished without                            difficulty. The patient tolerated the procedure. Scope In: Scope Out: Findings:                 No gross lesions were noted in the proximal                            esophagus and in the mid esophagus.                           LA Grade B (one or more mucosal breaks greater than                            5 mm, not extending between the tops of two mucosal                            folds) esophagitis with no bleeding was found in                            the distal esophagus.                           A Schatzki ring was found at the gastroesophageal                            junction.                           The Z-line was irregular and was found 32 cm from                            the incisors.                           A 4 cm hiatal hernia was present.                           Four non-bleeding linear  gastric ulcers with a                            clean ulcer base (Forrest Class III) were found in                            the gastric antrum. The largest lesion was 8 mm in                            largest dimension.                           Segmental moderate inflammation characterized by                            erosions, erythema and friability was found in the                            entire examined stomach. Due to the patient's                            history of gastric intestinal metaplasia, mapping                            surveillance was performed. Biopsies were taken                            with a cold forceps for histology and Helicobacter                            pylori testing. These were taken from the                            antrum/incisura, from the greater curve and lesser                            curve, from the cardia and fundus.                           No gross lesions were noted in the duodenal bulb,                            in the first portion of the duodenum and  in the                            second portion of the duodenum. Complications:            No immediate complications. Estimated Blood Loss:     Estimated blood loss was minimal. Impression:               - No gross lesions in the proximal esophagus and in                            the mid esophagus.                           -  LA Grade B esophagitis with no bleeding found                            distally.                           - Schatzki ring.                           - Z-line irregular, 32 cm from the incisors.                           - 4 cm hiatal hernia.                           - Non-bleeding gastric ulcers with a clean ulcer                            base (Forrest Class III) in the antrum.                           - Gastritis. Gastric mapping performed due to                            history of GIM.                           - No gross lesions in the duodenal bulb, in the                            first portion of the duodenum and in the second                            portion of the duodenum. Recommendation:           - Proceed to scheduled colonoscopy.                           - Initiate omeprazole 40 mg twice daily.                           - Observe patient's clinical course.                           - Await pathology results.                           - Repeat upper endoscopy in 4 months to check                            healing of esophagitis and ulcers.                           - The findings and recommendations were discussed  with the patient.                           - The findings and recommendations were discussed                            with the designated responsible adult. Yong Henle, MD 03/13/2024 8:39:38 AM

## 2024-03-13 NOTE — Op Note (Signed)
 [facility] Patient Name: Kayla [name] Procedure Date: 03/13/2024 7:59 AM MRN: [id number] Endoscopist: Yong Henle , MD, [id number] Age: 58 Referring MD:  Date of Birth: 1966-09-28 Gender: Female Account #: [id number] Procedure:                Colonoscopy Indications:              Surveillance: Personal history of adenomatous                            polyps on last colonoscopy 5 years ago Medicines:                Monitored Anesthesia Care Procedure:                Pre-Anesthesia Assessment:                           - Prior to the procedure, a History and Physical                            was performed, and patient medications and                            allergies were reviewed. The patient's tolerance of                            previous anesthesia was also reviewed. The risks                            and benefits of the procedure and the sedation                            options and risks were discussed with the patient.                            All questions were answered, and informed consent                            was obtained. Prior Anticoagulants: The patient has                            taken no anticoagulant or antiplatelet agents. ASA                            Grade Assessment: III - A patient with severe                            systemic disease. After reviewing the risks and                            benefits, the patient was deemed in satisfactory                            condition to undergo the procedure.  After obtaining informed consent, the colonoscope                            was passed under direct vision. Throughout the                            procedure, the patient's blood pressure, pulse, and                            oxygen saturations were monitored continuously. The                            PCF-HQ190L Colonoscope [id number] was introduced                            through the anus and  advanced to the 3 cm into the                            ileum. The colonoscopy was performed without                            difficulty. The patient tolerated the procedure.                            The quality of the bowel preparation was good. The                            terminal ileum, ileocecal valve, appendiceal                            orifice, and rectum were photographed. Scope In: 8:18:37 AM Scope Out: 8:30:42 AM Scope Withdrawal Time: 0 hours 9 minutes 1 second  Total Procedure Duration: 0 hours 12 minutes 5 seconds  Findings:                 The digital rectal exam findings include                            hemorrhoids. Pertinent negatives include no                            palpable rectal lesions.                           The terminal ileum and ileocecal valve appeared                            normal.                           A 10 mm polyp was found in the hepatic flexure. The                            polyp was sessile. The polyp was removed with a  cold snare. Resection and retrieval were complete.                           A 3 mm polyp was found in the rectum. The polyp was                            sessile. The polyp was removed with a cold snare.                            Resection and retrieval were complete.                           Multiple small-mouthed diverticula were found in                            the entire colon.                           Normal mucosa was found in the entire colon                            otherwise.                           Non-bleeding non-thrombosed external and internal                            hemorrhoids were found during retroflexion, during                            perianal exam and during digital exam. The                            hemorrhoids were Grade II (internal hemorrhoids                            that prolapse but reduce spontaneously). Complications:            No  immediate complications. Estimated Blood Loss:     Estimated blood loss was minimal. Impression:               - Hemorrhoids found on digital rectal exam.                           - The examined portion of the ileum was normal.                           - One 10 mm polyp at the hepatic flexure, removed                            with a cold snare. Resected and retrieved.                           - One 3 mm polyp in the rectum, removed with a cold  snare. Resected and retrieved.                           - Diverticulosis in the entire examined colon.                           - Normal mucosa in the entire examined colon                            otherwise.                           - Non-bleeding non-thrombosed external and internal                            hemorrhoids. Recommendation:           - The patient will be observed post-procedure,                            until all discharge criteria are met.                           - Discharge patient to home.                           - Patient has a contact number available for                            emergencies. The signs and symptoms of potential                            delayed complications were discussed with the                            patient. Return to normal activities tomorrow.                            Written discharge instructions were provided to the                            patient.                           - High fiber diet.                           - Use FiberCon 1-2 tablets PO daily.                           - Continue present medications.                           - Await pathology results.                           - Repeat colonoscopy in 3 years for surveillance.                           -  The findings and recommendations were discussed                            with the patient.                           - The findings and recommendations were discussed                             with the designated responsible adult. Yong Henle, MD 03/13/2024 8:44:21 AM

## 2024-03-13 NOTE — Progress Notes (Signed)
 Report to PACU, RN, vss, BBS= Clear.

## 2024-03-13 NOTE — Progress Notes (Signed)
 Called to room to assist during endoscopic procedure.  Patient ID and intended procedure confirmed with present staff. Received instructions for my participation in the procedure from the performing physician.

## 2024-03-14 ENCOUNTER — Telehealth: Payer: Self-pay

## 2024-03-14 ENCOUNTER — Telehealth: Payer: Self-pay | Admitting: *Deleted

## 2024-03-14 ENCOUNTER — Other Ambulatory Visit (HOSPITAL_COMMUNITY): Payer: Self-pay

## 2024-03-14 NOTE — Telephone Encounter (Signed)
  Follow up Call-     03/13/2024    7:28 AM  Call back number  Post procedure Call Back phone  # [phone]  Permission to leave phone message Yes     Patient questions:  Do you have a fever, pain , or abdominal swelling? No. Pain Score  0 *  Have you tolerated food without any problems? Yes.    Have you been able to return to your normal activities? Yes.    Do you have any questions about your discharge instructions: Diet   No. Medications  No. Follow up visit  No.  Do you have questions or concerns about your Care? No.  Actions: * If pain score is 4 or above: No action needed, pain <4.

## 2024-03-14 NOTE — Telephone Encounter (Signed)
 Pharmacy Patient Advocate Encounter   Received notification from CoverMyMeds that prior authorization for Dexlansoprazole  30MG  dr capsules is required/requested.   Insurance verification completed.   The patient is insured through [facility] Medicare Part D .   Per test claim: PA required; PA submitted to above mentioned insurance via CoverMyMeds Key/confirmation #/EOC [id number] Status is pending

## 2024-03-15 LAB — SURGICAL PATHOLOGY

## 2024-03-15 NOTE — Telephone Encounter (Signed)
 Noted pt. aware

## 2024-03-15 NOTE — Telephone Encounter (Signed)
 Pharmacy Patient Advocate Encounter  Received notification from [employer] Part D that Prior Authorization for Dexlansoprazole  30MG  dr capsules has been APPROVED from 03-14-2024 to 10-03-2024   PA #/Case ID/Reference #: [id number]

## 2024-03-17 ENCOUNTER — Ambulatory Visit: Payer: Self-pay | Admitting: Gastroenterology

## 2024-03-29 DIAGNOSIS — K259 Gastric ulcer, unspecified as acute or chronic, without hemorrhage or perforation: Secondary | ICD-10-CM | POA: Diagnosis not present

## 2024-03-29 DIAGNOSIS — Z8639 Personal history of other endocrine, nutritional and metabolic disease: Secondary | ICD-10-CM | POA: Diagnosis not present

## 2024-03-29 DIAGNOSIS — G4733 Obstructive sleep apnea (adult) (pediatric): Secondary | ICD-10-CM | POA: Diagnosis not present

## 2024-03-29 DIAGNOSIS — K219 Gastro-esophageal reflux disease without esophagitis: Secondary | ICD-10-CM | POA: Diagnosis not present

## 2024-03-29 DIAGNOSIS — K5900 Constipation, unspecified: Secondary | ICD-10-CM | POA: Diagnosis not present

## 2024-04-09 ENCOUNTER — Other Ambulatory Visit: Payer: Self-pay | Admitting: Nurse Practitioner

## 2024-04-09 DIAGNOSIS — J4541 Moderate persistent asthma with (acute) exacerbation: Secondary | ICD-10-CM

## 2024-04-17 DIAGNOSIS — G4733 Obstructive sleep apnea (adult) (pediatric): Secondary | ICD-10-CM | POA: Diagnosis not present

## 2024-06-01 DIAGNOSIS — Z7960 Long term (current) use of unspecified immunomodulators and immunosuppressants: Secondary | ICD-10-CM | POA: Diagnosis not present

## 2024-06-01 DIAGNOSIS — M21222 Flexion deformity, left elbow: Secondary | ICD-10-CM | POA: Diagnosis not present

## 2024-06-01 DIAGNOSIS — G8929 Other chronic pain: Secondary | ICD-10-CM | POA: Diagnosis not present

## 2024-06-01 DIAGNOSIS — M069 Rheumatoid arthritis, unspecified: Secondary | ICD-10-CM | POA: Diagnosis not present

## 2024-06-01 DIAGNOSIS — M542 Cervicalgia: Secondary | ICD-10-CM | POA: Diagnosis not present

## 2024-06-01 DIAGNOSIS — M21221 Flexion deformity, right elbow: Secondary | ICD-10-CM | POA: Diagnosis not present

## 2024-06-01 DIAGNOSIS — M0800 Unspecified juvenile rheumatoid arthritis of unspecified site: Secondary | ICD-10-CM | POA: Diagnosis not present

## 2024-06-10 ENCOUNTER — Other Ambulatory Visit: Payer: Self-pay | Admitting: Nurse Practitioner

## 2024-06-10 DIAGNOSIS — J4541 Moderate persistent asthma with (acute) exacerbation: Secondary | ICD-10-CM

## 2024-06-19 DIAGNOSIS — G4733 Obstructive sleep apnea (adult) (pediatric): Secondary | ICD-10-CM | POA: Diagnosis not present

## 2024-06-19 DIAGNOSIS — E782 Mixed hyperlipidemia: Secondary | ICD-10-CM | POA: Diagnosis not present

## 2024-06-19 DIAGNOSIS — R110 Nausea: Secondary | ICD-10-CM | POA: Diagnosis not present

## 2024-06-19 DIAGNOSIS — Z8639 Personal history of other endocrine, nutritional and metabolic disease: Secondary | ICD-10-CM | POA: Diagnosis not present

## 2024-06-20 DIAGNOSIS — M25521 Pain in right elbow: Secondary | ICD-10-CM | POA: Diagnosis not present

## 2024-06-20 DIAGNOSIS — M0800 Unspecified juvenile rheumatoid arthritis of unspecified site: Secondary | ICD-10-CM | POA: Diagnosis not present

## 2024-06-20 DIAGNOSIS — M25522 Pain in left elbow: Secondary | ICD-10-CM | POA: Diagnosis not present

## 2024-06-20 DIAGNOSIS — M08021 Unspecified juvenile rheumatoid arthritis, right elbow: Secondary | ICD-10-CM | POA: Diagnosis not present

## 2024-06-20 DIAGNOSIS — M19022 Primary osteoarthritis, left elbow: Secondary | ICD-10-CM | POA: Diagnosis not present

## 2024-06-20 DIAGNOSIS — M08022 Unspecified juvenile rheumatoid arthritis, left elbow: Secondary | ICD-10-CM | POA: Diagnosis not present

## 2024-06-20 DIAGNOSIS — M19021 Primary osteoarthritis, right elbow: Secondary | ICD-10-CM | POA: Diagnosis not present

## 2024-07-12 ENCOUNTER — Telehealth: Payer: Self-pay | Admitting: Gastroenterology

## 2024-07-12 ENCOUNTER — Encounter (HOSPITAL_BASED_OUTPATIENT_CLINIC_OR_DEPARTMENT_OTHER): Payer: Self-pay

## 2024-07-12 ENCOUNTER — Other Ambulatory Visit: Payer: Self-pay

## 2024-07-12 ENCOUNTER — Emergency Department (HOSPITAL_BASED_OUTPATIENT_CLINIC_OR_DEPARTMENT_OTHER)
Admission: EM | Admit: 2024-07-12 | Discharge: 2024-07-13 | Disposition: A | Discharge status: Home / Self Care | Source: Home / Self Care | Attending: Emergency Medicine | Admitting: Emergency Medicine

## 2024-07-12 ENCOUNTER — Emergency Department (HOSPITAL_BASED_OUTPATIENT_CLINIC_OR_DEPARTMENT_OTHER)

## 2024-07-12 ENCOUNTER — Emergency Department (HOSPITAL_BASED_OUTPATIENT_CLINIC_OR_DEPARTMENT_OTHER)
Admission: EM | Admit: 2024-07-12 | Discharge: 2024-07-12 | Disposition: A | Discharge status: Home / Self Care | Source: Ambulatory Visit | Attending: Emergency Medicine | Admitting: Emergency Medicine

## 2024-07-12 DIAGNOSIS — R10A1 Flank pain, right side: Secondary | ICD-10-CM | POA: Diagnosis present

## 2024-07-12 DIAGNOSIS — N200 Calculus of kidney: Secondary | ICD-10-CM | POA: Insufficient documentation

## 2024-07-12 DIAGNOSIS — N132 Hydronephrosis with renal and ureteral calculous obstruction: Secondary | ICD-10-CM | POA: Diagnosis not present

## 2024-07-12 DIAGNOSIS — R748 Abnormal levels of other serum enzymes: Secondary | ICD-10-CM | POA: Insufficient documentation

## 2024-07-12 DIAGNOSIS — D1803 Hemangioma of intra-abdominal structures: Secondary | ICD-10-CM | POA: Diagnosis not present

## 2024-07-12 DIAGNOSIS — R109 Unspecified abdominal pain: Secondary | ICD-10-CM | POA: Diagnosis present

## 2024-07-12 DIAGNOSIS — Z7951 Long term (current) use of inhaled steroids: Secondary | ICD-10-CM | POA: Diagnosis not present

## 2024-07-12 DIAGNOSIS — J45909 Unspecified asthma, uncomplicated: Secondary | ICD-10-CM | POA: Diagnosis not present

## 2024-07-12 DIAGNOSIS — K5730 Diverticulosis of large intestine without perforation or abscess without bleeding: Secondary | ICD-10-CM | POA: Diagnosis not present

## 2024-07-12 LAB — CBC WITH DIFFERENTIAL/PLATELET
Abs Immature Granulocytes: 0.03 K/uL (ref 0.00–0.07)
Basophils Absolute: 0.1 K/uL (ref 0.0–0.1)
Basophils Relative: 1 %
Eosinophils Absolute: 0.3 K/uL (ref 0.0–0.5)
Eosinophils Relative: 3 %
HCT: 38.3 % (ref 36.0–46.0)
Hemoglobin: 13 g/dL (ref 12.0–15.0)
Immature Granulocytes: 0 %
Lymphocytes Relative: 24 %
Lymphs Abs: 1.9 K/uL (ref 0.7–4.0)
MCH: 32.2 pg (ref 26.0–34.0)
MCHC: 33.9 g/dL (ref 30.0–36.0)
MCV: 94.8 fL (ref 80.0–100.0)
Monocytes Absolute: 0.8 K/uL (ref 0.1–1.0)
Monocytes Relative: 10 %
Neutro Abs: 4.9 K/uL (ref 1.7–7.7)
Neutrophils Relative %: 62 %
Platelets: 278 K/uL (ref 150–400)
RBC: 4.04 MIL/uL (ref 3.87–5.11)
RDW: 12.4 % (ref 11.5–15.5)
WBC: 7.9 K/uL (ref 4.0–10.5)
nRBC: 0 % (ref 0.0–0.2)

## 2024-07-12 LAB — URINALYSIS, ROUTINE W REFLEX MICROSCOPIC
Bilirubin Urine: NEGATIVE
Glucose, UA: NEGATIVE mg/dL
Ketones, ur: NEGATIVE mg/dL
Leukocytes,Ua: NEGATIVE
Nitrite: NEGATIVE
Protein, ur: NEGATIVE mg/dL
RBC / HPF: >50 RBC/hpf (ref 0–5)
Specific Gravity, Urine: 1.013 (ref 1.005–1.030)
pH: 6.5 (ref 5.0–8.0)

## 2024-07-12 LAB — COMPREHENSIVE METABOLIC PANEL WITH GFR
ALT: 9 U/L (ref 0–44)
AST: 15 U/L (ref 15–41)
Albumin: 4.2 g/dL (ref 3.5–5.0)
Alkaline Phosphatase: 86 U/L (ref 38–126)
Anion gap: 11 (ref 5–15)
BUN: 22 mg/dL — ABNORMAL HIGH (ref 6–20)
CO2: 26 mmol/L (ref 22–32)
Calcium: 9.9 mg/dL (ref 8.9–10.3)
Chloride: 104 mmol/L (ref 98–111)
Creatinine, Ser: 0.98 mg/dL (ref 0.44–1.00)
GFR, Estimated: >60 mL/min (ref >60)
Glucose, Bld: 105 mg/dL — ABNORMAL HIGH (ref 70–99)
Potassium: 4.2 mmol/L (ref 3.5–5.1)
Sodium: 141 mmol/L (ref 135–145)
Total Bilirubin: 0.7 mg/dL (ref 0.0–1.2)
Total Protein: 6.9 g/dL (ref 6.5–8.1)

## 2024-07-12 LAB — LIPASE, BLOOD: Lipase: 93 U/L — ABNORMAL HIGH (ref 11–51)

## 2024-07-12 MED ORDER — SODIUM CHLORIDE 0.9 % IV BOLUS
1000.0000 mL | Freq: Once | INTRAVENOUS | Status: AC
  Administered 2024-07-12: 1000 mL via INTRAVENOUS

## 2024-07-12 MED ORDER — IOHEXOL 300 MG/ML  SOLN
100.0000 mL | Freq: Once | INTRAMUSCULAR | Status: AC | PRN
  Administered 2024-07-12: 100 mL via INTRAVENOUS

## 2024-07-12 MED ORDER — KETOROLAC TROMETHAMINE 30 MG/ML IJ SOLN
30.0000 mg | Freq: Once | INTRAMUSCULAR | Status: AC
  Administered 2024-07-12: 30 mg via INTRAVENOUS
  Filled 2024-07-12: qty 1

## 2024-07-12 MED ORDER — TAMSULOSIN HCL 0.4 MG PO CAPS: 0.4000 mg | ORAL_CAPSULE | Freq: Every day | ORAL | 0 refills | Status: AC

## 2024-07-12 MED ORDER — HYDROMORPHONE HCL 1 MG/ML IJ SOLN
1.0000 mg | Freq: Once | INTRAMUSCULAR | Status: AC
  Administered 2024-07-12: 1 mg via INTRAVENOUS
  Filled 2024-07-12: qty 1

## 2024-07-12 MED ORDER — ONDANSETRON HCL 4 MG/2ML IJ SOLN
4.0000 mg | Freq: Once | INTRAMUSCULAR | Status: AC
  Administered 2024-07-12: 4 mg via INTRAVENOUS
  Filled 2024-07-12: qty 2

## 2024-07-12 MED ORDER — HYDROCODONE-ACETAMINOPHEN 5-325 MG PO TABS: 2.0000 | ORAL_TABLET | Freq: Four times a day (QID) | ORAL | 0 refills | Status: AC | PRN

## 2024-07-12 NOTE — ED Provider Notes (Signed)
  EMERGENCY DEPARTMENT AT [facility] Provider Note   CSN: [id number] Arrival date & time: 07/12/24  2312     Patient presents with: Flank Pain   Kayla [name] is a 58 y.o. female.  {Add pertinent medical, surgical, social history, OB history to HPI:32947} HPI     This is a 58 year old female with a history of kidney stones who presents with ongoing flank pain.  Was seen and evaluated this morning and noted to have 3 obstructing kidney stones on the right with the largest at 5 mm.  States that she began to feel better and was discharged home.  She slept and then her pain came back tonight with a vengeance.  She is taking Vicodin.  She is not currently taking any NSAIDs.  Also taking Flomax and an antiemetic.  She feels nauseated and is reporting intense right sided flank and back pain.  No fevers.  Prior to Admission medications   Medication Sig Start Date End Date Taking? Authorizing Provider  albuterol  (VENTOLIN  HFA) 108 (90 Base) MCG/ACT inhaler INHALE 2 PUFFS BY MOUTH EVERY 6 HOURS AS NEEDED FOR WHEEZING OR SHORTNESS OF BREATH 11/01/23   Cobb, Comer GAILS, NP  alendronate (FOSAMAX) 70 MG tablet Take 70 mg by mouth once a week. 12/04/21   [provider]  brimonidine (ALPHAGAN) 0.2 % ophthalmic solution SMARTSIG:In Eye(s) 01/23/24   [provider]  celecoxib (CELEBREX) 100 MG capsule Take 100 mg by mouth daily. Take 1 tablet every other day    [provider]  Cholecalciferol 50 MCG (2000 UT) TABS 1 tablet Orally Once a day    [provider]  Dexlansoprazole  (DEXILANT ) 30 MG capsule DR Take 1 capsule (30 mg total) by mouth 2 (two) times daily. 03/13/24   Mansouraty, Gabriel Jr., MD  dorzolamide-timolol (COSOPT) 22.3-6.8 MG/ML ophthalmic solution  11/17/20   [provider]  fluticasone  (FLONASE ) 50 MCG/ACT nasal spray Place 2 sprays into both nostrils daily. 10/20/23   Cobb, Comer GAILS, NP  HYDROcodone -acetaminophen   (NORCO/VICODIN) 5-325 MG tablet Take 2 tablets by mouth every 6 (six) hours as needed for severe pain (pain score 7-10). 07/12/24   Scott, Rocky SAILOR, PA-C  latanoprost (XALATAN) 0.005 % ophthalmic solution SMARTSIG:In Eye(s) 12/14/21   [provider]  mometasone -formoterol  (DULERA ) 200-5 MCG/ACT AERO INHALE 2 PUFFS BY MOUTH EVERY MORNING AND 12 HOURS AFTERWARDS 06/11/24   Malachy, Comer GAILS, NP  ORENCIA 125 MG/ML SOSY  01/07/21   [provider]  SPIRIVA  RESPIMAT 1.25 MCG/ACT AERS INHALE 2 PUFFS INTO THE LUNGS DAILY 04/10/24   Cobb, Comer GAILS, NP  tamsulosin (FLOMAX) 0.4 MG CAPS capsule Take 1 capsule (0.4 mg total) by mouth daily. 07/12/24   Scott, Rocky SAILOR, PA-C  tirzepatide (ZEPBOUND) 2.5 MG/0.5ML Pen Inject 2.5 mg into the skin once a week.    [provider]  venlafaxine XR (EFFEXOR-XR) 75 MG 24 hr capsule 75 mg.    [provider]    Allergies: Methotrexate, Prednisone , Other, Bromocriptine, Oxycodone  hcl, Sulfa antibiotics, and Tramadol    Review of Systems  Constitutional:  Negative for fever.  Gastrointestinal:  Positive for nausea.  Genitourinary:  Positive for flank pain. Negative for dysuria.  All other systems reviewed and are negative.   Updated Vital Signs There were no vitals taken for this visit.  Physical Exam Vitals and nursing note reviewed.  Constitutional:      Appearance: She is well-developed. She is not ill-appearing.     Comments: Uncomfortable  appearing  HENT:     Head: Normocephalic and atraumatic.  Eyes:     Pupils: Pupils are equal, round, and reactive to light.  Cardiovascular:     Rate and Rhythm: Normal rate and regular rhythm.     Heart sounds: Normal heart sounds.  Pulmonary:     Effort: Pulmonary effort is normal. No respiratory distress.     Breath sounds: No wheezing.  Abdominal:     General: Bowel sounds are normal.     Palpations: Abdomen is soft.     Tenderness: There is right CVA tenderness. There is no left CVA  tenderness.  Musculoskeletal:     Cervical back: Neck supple.  Skin:    General: Skin is warm and dry.  Neurological:     Mental Status: She is alert and oriented to person, place, and time.  Psychiatric:        Mood and Affect: Mood normal.     (all labs ordered are listed, but only abnormal results are displayed) Labs Reviewed - No data to display  EKG: None  Radiology: CT ABDOMEN PELVIS W CONTRAST Result Date: 07/12/2024 CLINICAL DATA:  Acute right-sided abdominal pain. EXAM: CT ABDOMEN AND PELVIS WITH CONTRAST TECHNIQUE: Multidetector CT imaging of the abdomen and pelvis was performed using the standard protocol following bolus administration of intravenous contrast. RADIATION DOSE REDUCTION: This exam was performed according to the departmental dose-optimization program which includes automated exposure control, adjustment of the mA and/or kV according to patient size and/or use of iterative reconstruction technique. CONTRAST:  100mL OMNIPAQUE IOHEXOL 300 MG/ML  SOLN COMPARISON:  September 20, 2018.  November 25, 2023. FINDINGS: Lower chest: No acute abnormality. Hepatobiliary: Left hepatic hemangioma is again noted. No cholelithiasis or biliary dilatation. Pancreas: Unremarkable. No pancreatic ductal dilatation or surrounding inflammatory changes. Spleen: Normal in size without focal abnormality. Adrenals/Urinary Tract: Adrenal glands appear normal. Bilateral nephrolithiasis is noted. Mild right hydroureteronephrosis is noted secondary to 3 distal right ureteral calculi, the largest measuring 5 mm. Urinary bladder is unremarkable. Stomach/Bowel: Stomach is within normal limits. Appendix appears normal. No evidence of bowel wall thickening, distention, or inflammatory changes. Sigmoid diverticulosis without inflammation. Vascular/Lymphatic: No significant vascular findings are present. No enlarged abdominal or pelvic lymph nodes. Reproductive: Uterus and bilateral adnexa are unremarkable.  Other: No abdominal wall hernia or abnormality. No abdominopelvic ascites. Musculoskeletal: Status post right total hip arthroplasty. No acute osseous abnormality is noted. IMPRESSION: 1. Bilateral nephrolithiasis. Mild right hydroureteronephrosis is noted secondary to 3 distal right ureteral calculi, the largest measuring 5 mm. 2. Sigmoid diverticulosis without inflammation. Electronically Signed   By: Lynwood Landy Raddle M.D.   On: 07/12/2024 12:27    {Document cardiac monitor, telemetry assessment procedure when appropriate:32947} Procedures   Medications Ordered in the ED  sodium chloride  0.9 % bolus 1,000 mL (1,000 mLs Intravenous New Bag/Given 07/12/24 2336)  ketorolac (TORADOL) 30 MG/ML injection 30 mg (30 mg Intravenous Given 07/12/24 2333)  HYDROmorphone  (DILAUDID ) injection 1 mg (1 mg Intravenous Given 07/12/24 2332)  ondansetron  (ZOFRAN ) injection 4 mg (4 mg Intravenous Given 07/12/24 2333)      {Click here for ABCD2, HEART and other calculators REFRESH Note before signing:1}                              Medical Decision Making Risk Prescription drug management.   ***  {Document critical care time when appropriate  Document review of labs and clinical decision  tools ie CHADS2VASC2, etc  Document your independent review of radiology images and any outside records  Document your discussion with family members, caretakers and with consultants  Document social determinants of health affecting pt's care  Document your decision making why or why not admission, treatments were needed:32947:::1}   Final diagnoses:  None    ED Discharge Orders     None

## 2024-07-12 NOTE — Telephone Encounter (Signed)
 Inbound call from patient stating that she has  mystery pain in her abdominal area and is requesting a call back to discuss. Please advise.

## 2024-07-12 NOTE — ED Notes (Signed)
 Pt aware of the need for a urine... Pt currently unable to currently provide a sample.SABRASABRA

## 2024-07-12 NOTE — Discharge Instructions (Addendum)
 You were found to have 3 stones in your right ureter, with the largest being 5 mm, you will likely pass this on your own without surgical intervention.  Start Flomax, take 1 tablet by mouth daily.  Use Norco as needed for severe breakthrough pain.  Finish your course of antibiotics as previously directed.  Return to the emergency department if your symptoms worsen.  Follow-up with [facility]. You were also found to have an elevated lipase, this is a pancreatic enzyme. There were no irregularities noted to your pancreas on your CT scan today. Discuss your abnormal lab result with your PCP.

## 2024-07-12 NOTE — ED Provider Notes (Signed)
 [place] EMERGENCY DEPARTMENT AT [facility] Provider Note   CSN: [id number] Arrival date & time: 07/12/24  9079     Patient presents with: Abdominal Pain   Kayla [name] is a 58 y.o. female.   57 year old female presenting with right sided abdominal pain.  Patient reports that symptoms began yesterday, describes intermittent bouts of pain that vary from sharp to dull/achy, initially pain began in her right flank with radiation now around her right lower quadrant.  She reports that while she was in the car on the way here she was screaming in pain, this is entirely resolved and she is not having any pain at this time.  She denies nausea/vomiting, diarrhea/constipation, prior history of abdominal surgeries.  She reports that she had a diverticulitis flare approximately 6 months to a year ago, also has history of kidney stones but reports that this pain does not feel similar.  She did note some urinary frequency earlier in the week, this was not associated with dysuria or fever and has resolved, she was started on Macrobid by her PCP for suspected UTI. She is a patient of Dr. Wilhelmenia, called their office this morning and was instructed to proceed to the ED, was then called back and told she could f/u with PCP initially but patient was already here.    Abdominal Pain      Prior to Admission medications   Medication Sig Start Date End Date Taking? Authorizing Provider  albuterol  (VENTOLIN  HFA) 108 (90 Base) MCG/ACT inhaler INHALE 2 PUFFS BY MOUTH EVERY 6 HOURS AS NEEDED FOR WHEEZING OR SHORTNESS OF BREATH 11/01/23   Cobb, Comer GAILS, NP  alendronate (FOSAMAX) 70 MG tablet Take 70 mg by mouth once a week. 12/04/21   [provider]  brimonidine (ALPHAGAN) 0.2 % ophthalmic solution SMARTSIG:In Eye(s) 01/23/24   [provider]  celecoxib (CELEBREX) 100 MG capsule Take 100 mg by mouth daily. Take 1 tablet every other day    [provider]   Cholecalciferol 50 MCG (2000 UT) TABS 1 tablet Orally Once a day    [provider]  Dexlansoprazole  (DEXILANT ) 30 MG capsule DR Take 1 capsule (30 mg total) by mouth 2 (two) times daily. 03/13/24   Mansouraty, Gabriel Jr., MD  dorzolamide-timolol (COSOPT) 22.3-6.8 MG/ML ophthalmic solution  11/17/20   [provider]  fluticasone  (FLONASE ) 50 MCG/ACT nasal spray Place 2 sprays into both nostrils daily. 10/20/23   Cobb, Comer GAILS, NP  HYDROcodone  bit-homatropine (HYCODAN) 5-1.5 MG/5ML syrup Take 5 mLs by mouth every 6 (six) hours as needed for cough. Patient not taking: Reported on 03/13/2024 10/20/23   Malachy Comer GAILS, NP  latanoprost (XALATAN) 0.005 % ophthalmic solution SMARTSIG:In Eye(s) 12/14/21   [provider]  mometasone -formoterol  (DULERA ) 200-5 MCG/ACT AERO INHALE 2 PUFFS BY MOUTH EVERY MORNING AND 12 HOURS AFTERWARDS 06/11/24   Malachy, Comer GAILS, NP  ORENCIA 125 MG/ML SOSY  01/07/21   [provider]  SPIRIVA  RESPIMAT 1.25 MCG/ACT AERS INHALE 2 PUFFS INTO THE LUNGS DAILY 04/10/24   Cobb, Comer GAILS, NP  tirzepatide (ZEPBOUND) 2.5 MG/0.5ML Pen Inject 2.5 mg into the skin once a week.    [provider]  venlafaxine XR (EFFEXOR-XR) 75 MG 24 hr capsule 75 mg.    [provider]    Allergies: Methotrexate, Prednisone , Other, Bromocriptine, Oxycodone  hcl, Sulfa antibiotics, and Tramadol    Review of Systems  Gastrointestinal:  Positive for abdominal pain.    Updated Vital Signs  Vitals:  07/12/24 0928 07/12/24 1000 07/12/24 1102 07/12/24 1130  BP: (!) 141/93 122/76  129/75  Pulse: 68 67  (!) 59  Resp: 20 18  18   Temp: 97.8 F (36.6 C)     SpO2: 100% 94% 97% 97%  Weight:      Height:         Physical Exam Vitals and nursing note reviewed.  HENT:     Head: Normocephalic.  Eyes:     Extraocular Movements: Extraocular movements intact.  Cardiovascular:     Rate and Rhythm: Normal rate and regular rhythm.     Heart sounds:  Normal heart sounds.  Pulmonary:     Effort: Pulmonary effort is normal.     Breath sounds: Normal breath sounds.  Abdominal:     General: Bowel sounds are normal.     Palpations: Abdomen is soft.     Tenderness: There is no abdominal tenderness. There is no right CVA tenderness, left CVA tenderness or guarding. Negative signs include Murphy's sign.  Musculoskeletal:     Cervical back: Normal range of motion.     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  Neurological:     Mental Status: She is alert and oriented to person, place, and time.     (all labs ordered are listed, but only abnormal results are displayed) Labs Reviewed  COMPREHENSIVE METABOLIC PANEL WITH GFR - Abnormal; Notable for the following components:      Result Value   Glucose, Bld 105 (*)    BUN 22 (*)    All other components within normal limits  LIPASE, BLOOD - Abnormal; Notable for the following components:   Lipase 93 (*)    All other components within normal limits  CBC WITH DIFFERENTIAL/PLATELET  URINALYSIS, ROUTINE W REFLEX MICROSCOPIC    EKG: None  Radiology: No results found.   Procedures   Medications Ordered in the ED - No data to display                                  Medical Decision Making This patient presents to the ED for concern of RLQ abdominal/flank pain, this involves an extensive number of treatment options, and is a complaint that carries with it a high risk of complications and morbidity.  The differential diagnosis includes appendicitis, diverticulitis, nephrolithiasis/ureterolithiasis, pancreatitis, pyelonephritis   Co morbidities that complicate the patient evaluation  H/o diverticulitis and nephrolithiasis    Additional history obtained:  Additional history obtained from record review External records from outside  source obtained and reviewed including telephone communications from GI office this morning   Lab Tests:  I Ordered, and personally interpreted labs.  The pertinent results include: CBC unremarkable, no leukocytosis, normal hemoglobin.  CMP notable for mildly elevated BUN at 29, however this is improved compared to her most recent baseline of 29 from 1 year ago.  Lipase is elevated at 93.  Urinalysis notable for large RBCs with rare bacteria, no dysuria and patient is currently being treated with Macrobid for suspected UTI due to urinary frequency earlier in the week.   Imaging Studies ordered:  I ordered imaging studies including CT abdomen/pelvis with contrast  I independently visualized and interpreted imaging which showed 1. Bilateral nephrolithiasis. Mild right hydroureteronephrosis is noted secondary to 3 distal  right ureteral calculi, the largest measuring 5 mm. 2. Sigmoid diverticulosis without inflammation.  I agree with the radiologist interpretation   Cardiac Monitoring: / EKG:  The patient was maintained on a cardiac monitor.  I personally viewed and interpreted the cardiac monitored which showed an underlying rhythm of: NSR   Problem List / ED Course / Critical interventions / Medication management I have reviewed the patients home medicines and have made adjustments as needed   Social Determinants of Health:  Insufficient physical activity   Test / Admission - Considered:  Per chart review, had episode of diverticulitis February 2025 that was picked up on a CT scan ordered by [facility]. Physical exam is largely unremarkable as above, patient's abdomen is soft and nontender to palpation, no CVA tenderness on exam.  Will defer imaging until lab results have returned. Labs are notable for elevated lipase, although I do have a low suspicion for pancreatitis will proceed with CT abdominal imaging given significant degree of pain that patient was in this morning,  with known history of diverticulitis as well as nephrolithiasis.  CT results as above. I suspect the patient symptoms today are secondary to nephrolithiasis as noted on her CT scan, this coincides with the colicky nature of her pain, patient did not require any medications or control pain/nausea while in the emergency department today, this is resolved prior to her arrival.  Patient is established with [facility] and is scheduled to be seen next week, I recommend that she keep this appointment.  Will prescribe Flomax and Norco to be used as needed for breakthrough pain.  Return precautions discussed, she voiced understanding and is in agreement with this plan.  She is appropriate for discharge at this time.  Amount and/or Complexity of Data Reviewed Labs: ordered. Radiology: ordered.  Risk Prescription drug management.        Final diagnoses:  Elevated lipase  Nephrolithiasis    ED Discharge Orders          Ordered    tamsulosin (FLOMAX) 0.4 MG CAPS capsule  Daily        07/12/24 1307    HYDROcodone -acetaminophen  (NORCO/VICODIN) 5-325 MG tablet  Every 6 hours PRN        07/12/24 1307               Glendia Rocky SAILOR, PA-C 07/12/24 1310    Curatolo, Adam, DO 07/12/24 1421

## 2024-07-12 NOTE — Telephone Encounter (Signed)
 Called pt back to discuss provider recommendations. Pt states, I'm already at [facility] ER, I had already called my PCP. There was no need to call me back to tell me that.

## 2024-07-12 NOTE — ED Triage Notes (Signed)
 Pt POV reporting persistent R flank pain r/t kidney stone, seen this AM for same, pain resolved but returned.

## 2024-07-12 NOTE — Telephone Encounter (Signed)
 Pt called back. Spoke with pt. She reports she is having a sharp, shooting pain in her right lower abd that is radiating to her back. Started yesterday. Rates 7/10. Pt reports nausea, no vomiting. Chills, no noted fever. After reviewing pt's previously colonoscopy report she had multiple diverticula noted throughout colon. Recommended pt proceed straight to ER, where they could do a CT scan and lab work. Pt frustrated, Isn't there anything that you can do at the office. Advised pt that we have no available appointments and if she proceeds to the ER, she can have a scan done right away. Pt verbalized understanding.

## 2024-07-12 NOTE — ED Triage Notes (Signed)
 Arrives ambulatory to the ED with complaints of right side abdominal pain x1 day. Patient reports that she has a history of diverticulitis her symptoms feel the same.

## 2024-07-13 MED ORDER — HYDROCODONE-ACETAMINOPHEN 5-325 MG PO TABS
1.0000 | ORAL_TABLET | Freq: Once | ORAL | Status: DC
  Filled 2024-07-13: qty 1

## 2024-07-13 NOTE — Discharge Instructions (Signed)
 You were seen today for ongoing pain for kidney stones.  Your pain is now well-controlled.  Continue medications at home.  Aggressively hydrate.  Follow-up with urology.

## 2024-07-16 DIAGNOSIS — N202 Calculus of kidney with calculus of ureter: Secondary | ICD-10-CM | POA: Diagnosis not present

## 2024-07-30 DIAGNOSIS — Z96641 Presence of right artificial hip joint: Secondary | ICD-10-CM | POA: Diagnosis not present

## 2024-07-30 DIAGNOSIS — M25551 Pain in right hip: Secondary | ICD-10-CM | POA: Diagnosis not present

## 2024-08-08 ENCOUNTER — Other Ambulatory Visit
